# Patient Record
Sex: Male | Born: 1935 | Race: Black or African American | Hispanic: No | Marital: Married | Smoking: Former smoker
Health system: Southern US, Community
[De-identification: ages and names within clinical notes are randomized; demographics above are authoritative.]

## PROBLEM LIST (undated history)

## (undated) DIAGNOSIS — I25810 Atherosclerosis of coronary artery bypass graft(s) without angina pectoris: Secondary | ICD-10-CM

## (undated) DIAGNOSIS — Z9889 Other specified postprocedural states: Secondary | ICD-10-CM

## (undated) DIAGNOSIS — I10 Essential (primary) hypertension: Secondary | ICD-10-CM

## (undated) DIAGNOSIS — E669 Obesity, unspecified: Secondary | ICD-10-CM

## (undated) DIAGNOSIS — R55 Syncope and collapse: Secondary | ICD-10-CM

## (undated) DIAGNOSIS — E7800 Pure hypercholesterolemia, unspecified: Secondary | ICD-10-CM

## (undated) DIAGNOSIS — F0390 Unspecified dementia without behavioral disturbance: Secondary | ICD-10-CM

## (undated) DIAGNOSIS — R0602 Shortness of breath: Secondary | ICD-10-CM

## (undated) DIAGNOSIS — Z951 Presence of aortocoronary bypass graft: Secondary | ICD-10-CM

## (undated) DIAGNOSIS — M549 Dorsalgia, unspecified: Secondary | ICD-10-CM

## (undated) DIAGNOSIS — I639 Cerebral infarction, unspecified: Secondary | ICD-10-CM

## (undated) DIAGNOSIS — G8929 Other chronic pain: Secondary | ICD-10-CM

## (undated) DIAGNOSIS — D649 Anemia, unspecified: Secondary | ICD-10-CM

## (undated) DIAGNOSIS — I252 Old myocardial infarction: Secondary | ICD-10-CM

## (undated) DIAGNOSIS — Z9861 Coronary angioplasty status: Principal | ICD-10-CM

## (undated) DIAGNOSIS — I2510 Atherosclerotic heart disease of native coronary artery without angina pectoris: Principal | ICD-10-CM

## (undated) DIAGNOSIS — K219 Gastro-esophageal reflux disease without esophagitis: Secondary | ICD-10-CM

## (undated) HISTORY — DX: Atherosclerotic heart disease of native coronary artery without angina pectoris: I25.10

## (undated) HISTORY — DX: Old myocardial infarction: I25.2

## (undated) HISTORY — DX: Presence of aortocoronary bypass graft: Z95.1

## (undated) HISTORY — DX: Obesity, unspecified: E66.9

## (undated) HISTORY — DX: Coronary angioplasty status: Z98.61

## (undated) HISTORY — DX: Atherosclerosis of coronary artery bypass graft(s) without angina pectoris: I25.810

## (undated) HISTORY — DX: Syncope and collapse: R55

## (undated) HISTORY — DX: Other specified postprocedural states: Z98.890

---

## 1985-03-08 DIAGNOSIS — I252 Old myocardial infarction: Secondary | ICD-10-CM

## 1985-03-08 DIAGNOSIS — I2510 Atherosclerotic heart disease of native coronary artery without angina pectoris: Secondary | ICD-10-CM

## 1985-03-08 DIAGNOSIS — Z951 Presence of aortocoronary bypass graft: Secondary | ICD-10-CM

## 1985-03-08 HISTORY — PX: CORONARY ARTERY BYPASS GRAFT: SHX141

## 1985-03-08 HISTORY — DX: Presence of aortocoronary bypass graft: Z95.1

## 1985-03-08 HISTORY — DX: Atherosclerotic heart disease of native coronary artery without angina pectoris: I25.10

## 1985-03-08 HISTORY — DX: Old myocardial infarction: I25.2

## 1997-06-06 ENCOUNTER — Encounter (HOSPITAL_COMMUNITY): Admission: RE | Admit: 1997-06-06 | Discharge: 1997-09-04 | Payer: Self-pay | Admitting: Cardiology

## 1997-09-05 ENCOUNTER — Encounter (HOSPITAL_COMMUNITY): Admission: RE | Admit: 1997-09-05 | Discharge: 1997-12-04 | Payer: Self-pay | Admitting: Cardiology

## 1997-09-23 ENCOUNTER — Other Ambulatory Visit: Admission: RE | Admit: 1997-09-23 | Discharge: 1997-09-23 | Payer: Self-pay | Admitting: Internal Medicine

## 1997-12-06 ENCOUNTER — Encounter (HOSPITAL_COMMUNITY): Admission: RE | Admit: 1997-12-06 | Discharge: 1998-03-06 | Payer: Self-pay | Admitting: Cardiology

## 1998-03-07 ENCOUNTER — Encounter (HOSPITAL_COMMUNITY): Admission: RE | Admit: 1998-03-07 | Discharge: 1998-06-05 | Payer: Self-pay | Admitting: Cardiology

## 1998-06-06 ENCOUNTER — Encounter (HOSPITAL_COMMUNITY): Admission: RE | Admit: 1998-06-06 | Discharge: 1998-09-04 | Payer: Self-pay | Admitting: Cardiology

## 1998-08-21 ENCOUNTER — Ambulatory Visit (HOSPITAL_COMMUNITY): Admission: RE | Admit: 1998-08-21 | Discharge: 1998-08-21 | Payer: Self-pay | Admitting: Internal Medicine

## 1998-08-22 ENCOUNTER — Emergency Department (HOSPITAL_COMMUNITY): Admission: EM | Admit: 1998-08-22 | Discharge: 1998-08-22 | Payer: Self-pay | Admitting: Emergency Medicine

## 1998-09-05 ENCOUNTER — Encounter (HOSPITAL_COMMUNITY): Admission: RE | Admit: 1998-09-05 | Discharge: 1998-10-06 | Payer: Self-pay | Admitting: Cardiology

## 1998-10-07 ENCOUNTER — Encounter (HOSPITAL_COMMUNITY): Admission: RE | Admit: 1998-10-07 | Discharge: 1999-01-05 | Payer: Self-pay | Admitting: Cardiology

## 2002-03-08 DIAGNOSIS — I2510 Atherosclerotic heart disease of native coronary artery without angina pectoris: Secondary | ICD-10-CM

## 2002-03-08 DIAGNOSIS — Z9861 Coronary angioplasty status: Secondary | ICD-10-CM

## 2002-03-08 DIAGNOSIS — I25810 Atherosclerosis of coronary artery bypass graft(s) without angina pectoris: Secondary | ICD-10-CM

## 2002-03-08 HISTORY — PX: CORONARY STENT PLACEMENT: SHX1402

## 2002-03-08 HISTORY — DX: Atherosclerotic heart disease of native coronary artery without angina pectoris: I25.10

## 2002-03-08 HISTORY — DX: Atherosclerotic heart disease of native coronary artery without angina pectoris: Z98.61

## 2002-03-08 HISTORY — DX: Atherosclerosis of coronary artery bypass graft(s) without angina pectoris: I25.810

## 2002-03-23 ENCOUNTER — Encounter: Payer: Self-pay | Admitting: Cardiology

## 2002-03-23 ENCOUNTER — Inpatient Hospital Stay (HOSPITAL_COMMUNITY): Admission: AD | Admit: 2002-03-23 | Discharge: 2002-03-28 | Payer: Self-pay | Admitting: Cardiology

## 2002-03-27 ENCOUNTER — Encounter: Payer: Self-pay | Admitting: Cardiovascular Disease

## 2002-03-27 ENCOUNTER — Encounter (INDEPENDENT_AMBULATORY_CARE_PROVIDER_SITE_OTHER): Payer: Self-pay | Admitting: Cardiology

## 2004-03-08 DIAGNOSIS — Z9889 Other specified postprocedural states: Secondary | ICD-10-CM

## 2004-03-08 HISTORY — DX: Other specified postprocedural states: Z98.890

## 2005-01-06 HISTORY — PX: CORONARY ANGIOPLASTY: SHX604

## 2005-01-29 ENCOUNTER — Inpatient Hospital Stay (HOSPITAL_COMMUNITY): Admission: RE | Admit: 2005-01-29 | Discharge: 2005-01-30 | Payer: Self-pay | Admitting: Cardiology

## 2005-03-09 ENCOUNTER — Ambulatory Visit (HOSPITAL_COMMUNITY): Admission: RE | Admit: 2005-03-09 | Discharge: 2005-03-09 | Payer: Self-pay | Admitting: Internal Medicine

## 2005-03-10 ENCOUNTER — Ambulatory Visit (HOSPITAL_COMMUNITY): Admission: RE | Admit: 2005-03-10 | Discharge: 2005-03-10 | Payer: Self-pay | Admitting: Internal Medicine

## 2005-07-07 ENCOUNTER — Encounter: Payer: Self-pay | Admitting: Internal Medicine

## 2006-01-06 IMAGING — CR DG UGI W/ HIGH DENSITY W/KUB
1 series · 1 of 1 positions shown · non-contrast
Comparison: none

CLINICAL DATA: Dysphagia.  Abdominal discomfort. 
DOUBLE CONTRAST UPPER G.I. WITH KUB:

[view not recorded]
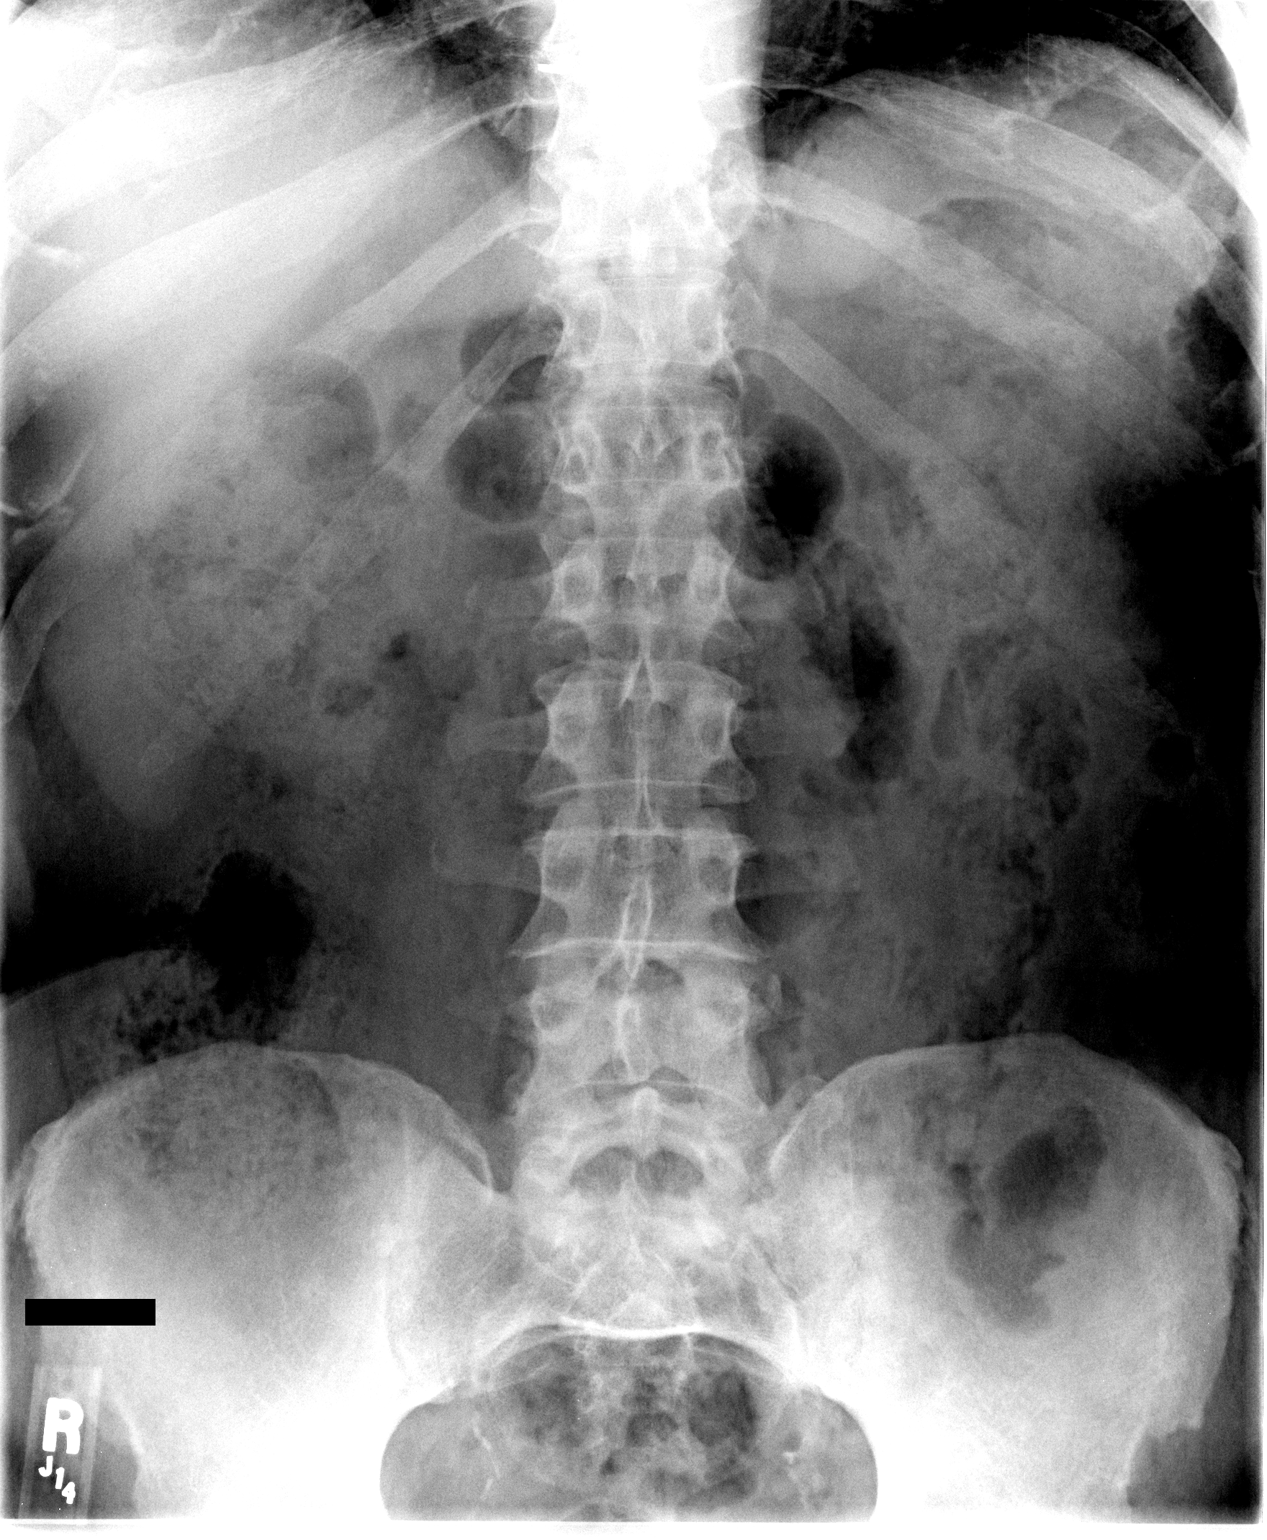

[1 of 1 positions shown; findings below may reference images not displayed]

FINDINGS: A scout film of the abdomen demonstrates unremarkable bowel gas pattern.  
Pharynx is unremarkable.  Normal esophageal peristalsis is identified.  There is a small focal filling defect at the GE junction on one series which may represent a ?pseudolesion? but recommend follow up or further evaluation to exclude small mass.  A small hiatal hernia with gastroesophageal reflux is noted.  The stomach is unremarkable.  The duodenal bulb is normal.  A distal duodenal diverticulum is present.
IMPRESSION: 1.  Small filling defect/irregularity at the GE junction - may represents a ?pseudolesion? but followup or endoscopy is recommended. 
2.  Small hiatal hernia and gastroesophageal reflux.

## 2006-03-14 ENCOUNTER — Emergency Department (HOSPITAL_COMMUNITY): Admission: EM | Admit: 2006-03-14 | Discharge: 2006-03-15 | Payer: Self-pay

## 2007-03-12 ENCOUNTER — Emergency Department (HOSPITAL_COMMUNITY): Admission: EM | Admit: 2007-03-12 | Discharge: 2007-03-12 | Payer: Self-pay | Admitting: Emergency Medicine

## 2008-02-27 ENCOUNTER — Inpatient Hospital Stay (HOSPITAL_COMMUNITY): Admission: AD | Admit: 2008-02-27 | Discharge: 2008-03-02 | Payer: Self-pay | Admitting: Internal Medicine

## 2008-08-06 HISTORY — PX: NM MYOVIEW LTD: HXRAD82

## 2008-08-06 HISTORY — PX: TRANSTHORACIC ECHOCARDIOGRAM: SHX275

## 2008-09-05 ENCOUNTER — Encounter: Admission: RE | Admit: 2008-09-05 | Discharge: 2008-09-05 | Payer: Self-pay | Admitting: Neurosurgery

## 2010-07-21 NOTE — Op Note (Signed)
NAME:  Albert Nelson, Albert Nelson NO.:  [id number]   MEDICAL RECORD NO.:  [id number]          PATIENT TYPE:  INP   LOCATION:  3715                         FACILITY:  [facility]   PHYSICIAN:  Bernette Redbird, M.D.   DATE OF BIRTH:  May 25, 1923   DATE OF PROCEDURE:  02/29/2008  DATE OF DISCHARGE:                               OPERATIVE REPORT   SURGEON:  Bernette Redbird, MD   PROCEDURE:  Colonoscopy.   INDICATION:  This is an 77 year old with hematochezia and unrevealing  upper endoscopy.   FINDINGS:  Sigmoid diverticulosis.  No bleeding.   PROCEDURE:  The nature, purpose, and risks of the procedure had been  discussed with the patient, provided written consent, who underwent a  mag citrate and MiraLax prep and was brought from his hospital room to  the Endoscopy Unit.  This procedure was performed immediately following  his upper endoscopy.  Total sedation for the 2 procedures was fentanyl  75 mcg and Versed 6 mg IV without clinical instability.   Digital exam of the prostate was unremarkable.   The Pentax adult video colonoscope was advanced very easily around the  colon to the area just above the cecum.  With the help of some external  abdominal compression, I was able to enter of the base of the cecum as  identified by the absence of further lumen beyond the ileocecal valve.  The appendiceal orifice itself was never specifically identified.  Pullback was then performed.  The quality of prep was very good and it  was felt that all areas were well seen.   There was no blood whatsoever in the colonic lumen.  There was amber-  colored clear fecal effluent coming out the ileocecal valve and  throughout the colon.   In the sigmoid region, the patient had multiple diverticula.  I did not  see any blood inspissated within these.   The remainder of the exam was normal.  I did not see any vascular  ectasia, colonic ulcerations, polyps, or masses.  Retroflexion in the  rectum and  re-inspection of the rectum were normal.  No biopsies were  obtained.  The patient tolerated the procedure well, and there were no  apparent complications.   IMPRESSION:  1. No active bleeding at the time of this exam.  2. Sigmoid diverticula without alternative source of bleeding      identified.  3. Almost certainly, this is now quiescent-diverticular bleed.   PLAN:  Advance diet.  Discharge in the next day or so.           ______________________________  Bernette Redbird, M.D.     RB/MEDQ  D:  02/29/2008  T:  02/29/2008  Job:  [id number]   cc:   Margaretmary Bayley, M.D.

## 2010-07-21 NOTE — Op Note (Signed)
NAME:  LYNK, MARTI NO.:  [id number]   MEDICAL RECORD NO.:  [id number]          PATIENT TYPE:  INP   LOCATION:  3715                         FACILITY:  [facility]   PHYSICIAN:  Bernette Redbird, M.D.   DATE OF BIRTH:  01/02/1924   DATE OF PROCEDURE:  02/29/2008  DATE OF DISCHARGE:                               OPERATIVE REPORT   PROCEDURE:  Upper endoscopy.   INDICATIONS:  An 77 year old gentleman with hematochezia, with history  of aspirin and Mobic exposure, also history of significant dysphagia  symptoms.   FINDINGS:  Distal erosive esophagitis.  Prominent Schatzki's ring above  small hiatal hernia.  No NSAID gastropathy or source of bleeding  endoscopically evident.   PROCEDURE IN DETAIL:  The nature, purpose, and risks of the procedure  have been discussed with the patient who provided written consent.  He  was brought in a fasting state from his hospital room to the endoscopy  unit and sedated with fentanyl 50 mcg and Versed 3.5 mg IV without  arrhythmias or desaturation.   I had a little bit of trouble passing the Pentax adult video endoscope  through the cricopharyngeal region, but it did pop into the esophagus.  The proximal esophagus was normal but the distal esophagus had several  linear and stellate erosions consistent with reflux esophagitis or  possibly pill-induced esophagitis.  There was a prominent, slightly  irregular Schatzki's ring at the squamocolumnar junction which offered  no resistance to passage of the endoscope.  Below this was a 2-cm hiatal  hernia.   The stomach contained no significant residual, no blood or coffee-ground  material, and mucosa was remarkably normal, without evidence of any  NSAID gastropathy, erosions, gastritis, ulcers, nor any polyps, masses,  or vascular ectasia including a retroflexed view of the cardia which  looked normal.   The pylorus, duodenal bulb, and second duodenum similarly looked normal.   The  scope was removed from the patient.  I elected not to proceed to  endoscopic dilatation at this time, in part because of the erosive  changes, and in part because of a previous plan not to dilate in view of  the recent bleeding, which I did not want to take the chance of  confusing the picture.   The patient tolerated the procedure well and there were no apparent  complications.   IMPRESSION:  1. No active bleeding or likely gastrointestinal bleeding source based      on current examination, seen in the upper tract.  2. Prominent esophageal mucosal ring which probably accounts for his      dysphagia symptoms.  3. Distal erosive esophagitis.   PLAN:  1. PPI therapy for the next several weeks to help the esophagus heal.  2. Elective dilatation could be arranged in the future if the patient      continues to have significant dysphagia symptoms, although      sometimes PPI therapy alone will be enough to take care of those      symptoms.  3. Proceed to colonoscopic evaluation for evaluation of his recent  hematochezia.           ______________________________  Bernette Redbird, M.D.     RB/MEDQ  D:  02/29/2008  T:  02/29/2008  Job:  [id number]   cc:   Margaretmary Bayley, M.D.

## 2010-07-21 NOTE — Consult Note (Signed)
NAME:  Albert Nelson NO.:  [id number]   MEDICAL RECORD NO.:  [id number]           PATIENT TYPE:   LOCATION:                                 FACILITY:   PHYSICIAN:  Bernette Redbird, M.D.   DATE OF BIRTH:  [birth year]   DATE OF CONSULTATION:  DATE OF DISCHARGE:                                 CONSULTATION   We were called by Dr. Margaretmary Bayley to see this patient for rectal  bleeding.   HISTORY OF PRESENT ILLNESS:  This is an 77 year old gentleman with a  history of coronary artery disease, CVA, and colon cancer who reports  sudden onset of left lower quadrant abdominal pain yesterday afternoon,  coincident with onset of maroon bloody stools.  The patient reports 8-10  bowel movements overnight.  Also, the patient reports dysphagia to the  solids, not to liquids for the past 6 months.  He often spits out meats  and solid food.  He has to limit himself to a soft diet, mostly mashed  potatoes.  He is on Mobic, as well as Advil.  He is unsure of why.  He  has occasional constipation and no history of bleeding.   Past medical history is significant for:  1. Chronic GERD with food intake.  2. Diabetes mellitus.  3. Vertigo.  4. Coronary artery bypass grafting in 1984 and 2001.  His cardiologist      is Dr. Elsie Lincoln.  5. He has a history of CVA.  6. Hypertension.  7. Paroxysmal atrial fibrillation.  8. History of colon cancer status post hemicolectomy.  9. BPH.   Current medications are per chart but include Mobic, Plavix, and an 81  mg aspirin.   He has an allergy to PENICILLIN.   Review of systems is significant for his being weak and tired.   Social history is negative for alcohol, tobacco, and drugs.  He is  married.   Family history is negative for colon cancers and known ulcer disease.   PHYSICAL EXAMINATION:  GENERAL:  He is alert and oriented, appears to  have some periorbital edema that may be chronic.  VITAL SIGNS:  Temperature is 98.8, pulse 99,  respirations 20, blood  pressure is 107/66.  HEART:  Irregular rate and rhythm.  LUNGS:  Clear.  ABDOMEN:  Soft, nontender, nondistended with good bowel sounds.   Labs are pending including a CBC, PT/PTT, type and cross, and BMET.  CT  of his abdomen and pelvis is pending.   ASSESSMENT:  Dr. Molly Maduro Buccini has seen and examined this patient,  collected a history and reviewed his chart.  His impression is that this  is an 77 year old male on nonsteroidal antiinflammatory drugs with a  gastrointestinal bleed and left lower quadrant acute abdominal pain.  Finally, he has chronic gastroesophageal reflux disease and dysphagia to  solids.  Differential diagnoses is wide including upper tract  etiologies, such as, peptic ulcer disease or gastritis, as well as  possible angioectasias versus ischemic bowel versus even a recurrence of  his colon cancer.  We will plan to move forward with  gentle intravenous  hydration as well as intravenous pain medication, transfuse p.r.n.,  obtain a CT of his abdomen and pelvis which may help [place] to determine his  next step.  We will consider endocolon on this admission, probably a  barium swallow to evaluate his dysphagia.  Next step is to be determined  once we have labs and CT results.  Thanks very much for this  consultation.      Stephani Police, PA    ______________________________  Bernette Redbird, M.D.    MLY/MEDQ  D:  02/27/2008  T:  02/28/2008  Job:  [id number]   cc:   Margaretmary Bayley, M.D.

## 2010-07-24 NOTE — Consult Note (Signed)
NAME:  Albert, Nelson NO.:  [id number]   MEDICAL RECORD NO.:  [id number]                   PATIENT TYPE:  INP   LOCATION:  6523                                 FACILITY:  [facility]   PHYSICIAN:  Deanna Artis. Sharene Skeans, M.D.           DATE OF BIRTH:  [birth year]   DATE OF CONSULTATION:  03/26/2002  DATE OF DISCHARGE:                                   CONSULTATION   NEUROLOGY CONSULTATION:   CHIEF COMPLAINT:  Diplopia.   HISTORY OF THE PRESENT CONDITION:  The patient is a 77 year old right-handed  Afro-American gentleman who was admitted following an abnormal Cardiolite  test that showed anterolateral inferior ischemia considered to be at high  risk.   The patient was hospitalized and had stenting procedures in the left  circumflex and also the right PLA on separate days, the latter done today by  Dr. Daphene Jaeger.   Several hours after the procedure, the patient noted onset of diplopia.  He  was evaluated by Dr. Alanda Amass who found evidence of decreased ability to  gaze to the left with his right eye.  I was asked to see him to evaluate the  etiology of his dysfunction and make recommendations for further workup and  treatment.   PAST MEDICAL HISTORY:  The patient has severe atherosclerotic cardiovascular  disease and had coronary artery bypass graft in 1987 (three vessels).  The  patient's ejection fraction was about 46%.  He has dyslipidemia, noninsulin-  dependent type 2 diabetes mellitus and hypertension as risk factors for  stroke.   PAST SURGICAL HISTORY:  CABG.   REVIEW OF SYSTEMS:  Erectile dysfunction, occasional chest pain, increased  shortness of breath with exertion.  The patient has not had any bleeding  dyscrasias, any fever, change in appetite, weight loss, nausea, vomiting,  diarrhea, dysuria, hematuria.  He has arthritis in his hands and his feet.  He wears glasses.  All system review is otherwise negative.   CURRENT MEDICATIONS:  1. Zyrtec one per day.  2. Diltiazem XR 120 mg per day.  3. Glucovance 2.5/500 one twice a day.  4. Niaspan ER 1000 mg at bedtime.  5. Cozaar 50 mg per day.  6. Pravachol 80 mg at bedtime.  7. Aspirin 81 mg per day.   ALLERGIES:  PENICILLIN.   FAMILY HISTORY:  No known history of stroke.  There are others with  atherosclerotic cardiovascular disease and hypertension.   SOCIAL HISTORY:  The patient is married.  He has three children, six  grandchildren.  He has not used tobacco for 27 years.  He does not drink  alcohol.  He exercises three times a week for 60 minutes at a time.   PHYSICAL EXAMINATION:  GENERAL:  He is a pleasant well-developed gentleman  in no acute distress.  VITAL SIGNS:  Blood pressure 118/60, resting pulse 64, respirations 20,  temperature 98.0, pulse oximetry 97% on room  air.  HEAD/EYES/EARS/NOSE AND THROAT:  No signs of infection.  Supple neck.  Full  range of motion.  No cranial or cervical bruits.  LUNGS:  Clear to auscultation.  HEART:  No murmurs.  Pulses normal.  ABDOMEN:  Soft, nontender, bowel sounds normal.  EXTREMITIES:  The patient has decreased pulses in his lower extremities in  the dorsalis pedis.  His groin is clean and dry.  I did not manipulate his  right leg.  He has good pulses in his upper extremities.  Capillary refill  is noted in the lower extremities and lower extremities.  NEUROLOGIC:  Mental status - The patient is awake, alert, attentive,  appropriate.  No dysphasia or dyspraxia.  Cranial nerves - Pupils are  sluggish and poorly reactive but the left reacts better than the right.  He  has bilateral cataracts.  I cannot see his fundus more than a brief glimpse.  Symmetrical facial strength.  He is able to count fingers in all visual  fields to single and double simultaneous stimuli.  Midline tongue and uvula.  He moves all four extremities.  I did not test the right lower extremity.  Arms bilaterally and left leg are normal both for  strength and fine motor  skills.  He is able to wiggle his toes and has excellent strength in his  foot dorsiflexors and plantar flexors on the right.  This is not tested  further.  Sensory examination shows a mild peripheral stocking neuropathy  and good stereognosis.  CEREBELLAR:  Good finger-to-nose, rapid repetitive movements, gait cannot be  tested.  Deep tendon reflexes are absent.  He has bilateral flexor plantar  responses.   IMPRESSION:  1. 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  2. Diplopia.  3. Severe atherosclerotic cardiovascular disease.   PLAN:  1. CT scan of the brain in the early morning hours 03/27/02 once he can     travel.  2. Patch his eye which will stop the double vision.  Slowly I believe that     the right eye will become more mobile.  3. Check hemoglobin A1C to look at degree of glycemic control.  The patient     has other appropriate interventions for hypertension and dyslipidemia but     if it has not been checked in a while the lipid panel should be checked.   I appreciate the opportunity to see the patient.  If you have questions or I  can be of assistance, do not hesitate to contact me.                                               Deanna Artis. Sharene Skeans, M.D.   [facility]  D:  03/26/2002  T:  03/27/2002  Job:  [id number]   cc:   Gerlene Burdock A. Alanda Amass, M.D.  [phone] [address], [address]  [place]  [place] [address]  Fax: [phone]

## 2010-07-24 NOTE — Cardiovascular Report (Signed)
NAME:  Albert Nelson, Albert Nelson NO.:  [id number]   MEDICAL RECORD NO.:  [id number]                   PATIENT TYPE:  INP   LOCATION:  4732                                 FACILITY:  [facility]   PHYSICIAN:  Nicki Guadalajara, M.D.                  DATE OF BIRTH:  Nov 04, 1923   DATE OF PROCEDURE:  03/26/2002  DATE OF DISCHARGE:                              CARDIAC CATHETERIZATION   PROCEDURE:  Coronary intervention.   INDICATIONS:  The patient is a 77 year old [place] American male, patient of  Drs. Elsie Lincoln and [employer].  He is status post prior bypass surgery in 1987 by  Dr. Andrey Campanile with a LIMA to the LAD vein to diagonal vein to the PDA.  He  recently was admitted with chest pain, increasing shortness of breath  following an abnormal Cardiolite scan.  Catheterization was done by Dr.  Elsie Lincoln on March 23, 2002, which showed total native LAD occlusion after  the diagonal vessel with a patent LIMA to the mid distal LAD, but with  diffuse 95% stenoses in the small distal LAD system.  He had an occluded  vein graft to the diagonal and an occluded vein graft to the PDA.  A  circumflex vessel had proximal 75% stenoses and then the marginal had  diffuse 90% segmental stenoses.  The right coronary artery had 50-70%  proximal narrowing and then had 95% stenosis in the region of the PLA  vessel.  Dr. Elsie Lincoln performed initial stage intervention of the circumflex  marginal vessel with stenting proximally with a 2.5 x 13 mm CYPHER stent of  greater than 2.75 post-dilatation, and then with tandem stenting of the mid  and distal circumflex.  The patient is now referred for stage intervention  of the right coronary artery.   DESCRIPTION OF PROCEDURE:  After premedication with Valium 5 mg  intravenously, the patient was prepped and draped in the usual fashion.  The  right femoral artery was punctured anteriorly and a 7 French sheath was  inserted.  Heparin 4100 units was administered and  the patient received  double bolus Integrilin.  The patient also was given additional Plavix since  it did not appear that he had been receiving this for some reason.  A right  7 [place] guide with side holes was used for the guiding catheter.  After  documentation of therapeutic anticoagulation, a ________ guide wire, soft  was used and advanced down to the distal right coronary artery.  Due to the  segmental nature of the stenoses proximal and distal to the PDA, pre-  dilatation was necessary.  A 2.5 x 15 mm Maverick balloon was advanced and  dilatation was done beyond the PDA takeoff in the distal right coronary  artery at the site of the tightest lesion. It did appear that there was  stenoses up to 70% proximal of the PDA, and for this reason,  this was also  dilated. It was felt that the entire segment would need to be stented in  light of the diffuseness of the disease proximal and distal to the PDA.  Attempts were made to then insert a 2.0 x 23 mm CYPHER stent, but this  seemed to hangup at this site of initial tightest lesion despite the  previous pre-dilatation.  The stent was therefore removed.  Additional IC  nitroglycerin was administered.  Additional pre-dilatation was performed.  Again the stent was advanced to the this distal right coronary artery, but  again was unable to cross this site beyond the PDA.  At this point, the  stent was again removed and it was felt that we needed to exchange the wire  for a more stiff [employer] grand slam wire. After this wire was inserted  dilatation was again done with the Maverick balloon.  A 3.0 x 23 mm CYPHER  stent was then able to be deployed at the site covering both proximal and  distal to the PDA takeoff.  Dilatation was done up to a maximum of 16  atmospheres.  There was TIMI-3 flow.  There was no evidence for any  dissection. There was no change in the more distal stenosis of approximately  40% in the PLA and there was no change in the  proximal 50 to less than 60%  proximal RCA stenosis.  At this time, however, the patient had received  under 400 cubic centimeter of contrast, and it was felt that the additional  sites did not need to be dilated.  Discussion regarding LIMA to distal LAD  intervention will be done with Dr. Elsie Lincoln, but in light of the current dye  load, this will not be done presently.   HEMODYNAMIC DATA:  Central aortic pressure was 110/60, mean 80.   ANGIOGRAPHIC DATA:  The right coronary artery had smooth area of 50 to less  than 60% narrowing beyond the proximal bend.  After the acute margin, the  right coronary artery narrowed to 60 to 70% proximal to the PDA, and then  had irregularity up to 95% beyond the PDA takeoff in the distal right  coronary artery. There also was narrowing of approximately 40% more distally  in the area of the PLA takeoff.  Following PTCA and ultimately stenting, to  diffuse 70% to 95% stenoses before and after the PDA takeoff were ultimately  reduced to 0% with the culmination in a 3.0 x 23 mm drug-eluting CYPHER  stent dilated up to 3.3 mm.  There was no change in the distal 40% PLA  stenosis and then the 50 to less than 60% proximal RCA stenosis.   IMPRESSION:  Difficult, but successful percutaneous transluminal coronary  angioplasty stenting of the distal right coronary artery proximal to and  just distal to the posterior descending artery takeoff with the 70 to 95%  diffuse stenosis being reduced to 0% (3.0 x 23 mm drug-eluting CYPHER stent)  done with double bolus Integrilin and weight-adjusted heparinization.                                               Nicki Guadalajara, M.D.    TK/MEDQ  D:  03/26/2002  T:  03/26/2002  Job:  [id number]   cc:   Madaline Savage, M.D.  [address]  [place]  [place] [address]  Fax: [phone]   Margaretmary Bayley, M.D.  [address]  [place]  [place] [address]  Fax: [phone]   Janeece Riggers

## 2010-07-24 NOTE — Discharge Summary (Signed)
NAME:  Albert Nelson, Albert Nelson NO.:  [id number]   MEDICAL RECORD NO.:  [id number]                   PATIENT TYPE:  INP   LOCATION:  6523                                 FACILITY:  [facility]   PHYSICIAN:  Learta Codding, M.D. [facility]             DATE OF BIRTH:  06-04-1923   DATE OF ADMISSION:  03/23/2002  DATE OF DISCHARGE:  03/28/2002                           DISCHARGE SUMMARY - REFERRING   HISTORY:  The patient is a pleasant 77 year old male with a history of  coronary artery disease who was admitted to [facility] in December  2003, with an exacerbation of his congestive heart failure.  He was seen by  Dr. Russella Dar in the office on March 27, 2002, to follow up his anemia.  The  patient complained of significant weakness, dizziness, and syncope in the  office.  The patient was found to have a blood pressure of 88/60.  The  patient had been feeling extremely lethargic for at least two weeks.  He  also has had some problems with vertigo which had been worked up by Dr. Sandria Manly  and apparently a MRI was recently performed.  The symptoms on the day he saw  Dr. Russella Dar were different from his vertigo symptoms.  He was referred to Dr.  Andee Lineman for further evaluation.  Dr. Andee Lineman felt the patient should be  admitted to the hospital for further evaluation of hypotension.  He felt the  patient may have been dehydrated.   PAST MEDICAL HISTORY:  The patient has a history of coronary artery bypass  graft surgery x2 in 1984, with a LIMA to the LAD, saphenous vein graft to  the OM-2, saphenous vein graft to the OM-1, saphenous vein graft to the  diagonal, saphenous vein graft to the PDA.  In 2001, the patient had a LIMA  to the LAD, saphenous vein graft to the OM-1 and OM-2.  The patient is  status post cardiac catheterization in July 2003.  The LAD was found to be  totally occluded.  The first diagonal had a 90% lesion, the septal  perforator had a 90% lesion, circumflex had a  90%, the RCA was totally  occluded.  The LIMA to the LAD was patent.  The RIMA to the LAD was also  patent.  There was a distal LAD beyond the anastomosis which was diseased at  approximately 70%.  There was a saphenous vein graft to the OM-1 and OM-2  which were patent.  The PDA was totally occluded.  The posterior lateral  branch was 90%.  There were left and right collaterals and the autografts  were noted to be nonfunctional.   The patient also has a history of paroxysmal atrial fibrillation, history of  postural dizziness with long and short-term memory problems.  He is an  insulin-dependent diabetic.  He has a history of hypertension,  hyperlipidemia.  There is a positive  family history of coronary artery  disease, history of obesity.  His ejection fraction is 55% at the time of  his last catheterization in September 2003.  There is a history of colon  cancer; he is status post hemicolectomy and left knee surgery as well as  bilateral hernia repairs.   ALLERGIES:  No known drug allergies.   SOCIAL HISTORY:  The patient lives in [place] with his wife.  He is  retired.  He does not smoke or use alcohol.   FAMILY HISTORY:  Positive for coronary artery disease.   HOSPITAL COURSE:  As noted, the patient was admitted to [facility]  on March 28, 2002, after being seen in the office by Dr. Andee Lineman with  hypotension.  The patient was felt to be dehydrated and it was felt that he  was over diuresed with his diuretics.  His ACE inhibitor, his Beta-Blocker  and his diuretics were held at the time admission.  The BUN was noted to be  significantly elevated at 80 with creatinine 2.0.  A BNP was 80.  A CBC  revealed hemoglobin 12.9.   The patient was hydrated.  He improved significantly.  On the March 29, 2002, his BUN was down to 43, creatinine was 1.1.  It was felt the patient  could be discharged with early followup in the office.  The patient's Toprol  was resumed prior  to discharge.  His Lasix and Lotensin were to be held.   LABORATORY DATA:  A CBC on March 27, 2002, revealed hemoglobin 12.9,  hematocrit 38.7, WBC 10.1, platelets 186,000.  On the day of discharge, BUN  was 43, creatinine 1.1, potassium 4.4, glucose 126.  On admission, BUN was  80, creatinine 2.0, potassium 5, glucose 161.  Cardiac enzymes were  negative.  His BNP on admission was 166, a repeat was 80.   Chest x-ray showed cardiomegaly with previous bypass surgery with no acute  cardiopulmonary disease.   DISCHARGE MEDICATIONS:  The patient was discharged on his previous  medications which included:  1. Lipitor 20 mg at h.s.  2. Lorazepam 1 mg p.o. t.i.d.  3. Nexium 40 mg daily.  4. Multivitamin daily.  5. Insulin as directed.  6. Aspirin 81 mg daily.  7. Toprol XL 50 mg daily.  8. Plavix 75 mg daily.  9. Vitamin C 1,000 mg daily.  10.      Fluoracizine 30 mg a.m., 20 mg p.m.  11.      His Lotensin was held as was his Lasix.  He was previously on     Lotensin 20 mg daily, Lasix 40 mg b.i.d.  12.      He was told not to take ibuprofen for now as well.  13.      The patient is told to take Tylenol as needed for pain.  He was     told to avoid Aleve, ibuprofen, Motrin, Advil etc.  14.      He was given a prescription for Darvocet-N 100 one q.6 h. p.r.n. as     needed for severe hip pain.   ACTIVITY:  As tolerated but nothing strenuous.   DIET:  He is to be on a low-salt, low-fat, diabetic diet.   DISCHARGE INSTRUCTIONS:  He is to have a CBC and a basic metabolic panel on  his next office visit.   FOLLOWUP:  He is to be seen by Dian Queen, P.A.-C. on Monday, January 26,  at 4:15 p.m.  He is to see Dr. Vernon Prey as needed or as scheduled.   PROBLEM LIST:  1. Dehydration associated with hypotension.  The patient's Lotensin and     diuretics have been held.  If he is improved when he is seen in the    office on Monday, his Lasix can be resumed at a lower dose.  He was      previously on 40 mg b.i.d.  Probably 40 mg daily would be a good dose and     then his Lotensin could be resumed if his BUN and creatinine have     improved.  2. History of coronary artery disease as documented above with previous     coronary artery bypass graft and redo coronary artery bypass graft.  3. History of paroxysmal atrial fibrillation.  4. History of memory problems.  5. History of insulin-dependent diabetes mellitus.  6. Hypertension.  7. Hyperlipidemia.  8. Positive family history of coronary artery disease.  9. Obesity.  10.      Ejection fraction of 55% by catheterization in September 2003.  11.      History of colon cancer.  12.      Status post hemicolectomy, knee surgery and bilateral hernia     repairs.  13.      Renal insufficiency, improving at time of discharge.   ADDENDUM:  It should also be noted that his potassium is also on hold along  with his Lasix.     Kinnie Scales. Rinehuls, P.A.-C, [facility]            Learta Codding, M.D. [facility]    DLR/MEDQ  D:  03/29/2002  T:  03/29/2002  Job:  [id number]   cc:   Dennison Nancy  M.D.    Ernestina Penna  M.D.

## 2010-07-24 NOTE — Cardiovascular Report (Signed)
NAME:  Albert Nelson, Albert Nelson NO.:  [id number]   MEDICAL RECORD NO.:  [id number]                   PATIENT TYPE:  OIB   LOCATION:  2899                                 FACILITY:  [facility]   PHYSICIAN:  Madaline Savage, M.D.             DATE OF BIRTH:  08/02/1923   DATE OF PROCEDURE:  03/23/2002  DATE OF DISCHARGE:                              CARDIAC CATHETERIZATION   PROCEDURES PERFORMED:  1. Selective coronary angiography by Judkins technique.  2. Retrograde left heart catheterization.  3. Left ventricular angiography.  4. Selective visualization of the left internal mammary artery graft.  5. Selective visualization of multiple saphenous vein grafts.  6. Percutaneous coronary stenting of the distal left circumflex.  7. Percutaneous stenting of the middle circumflex.  8. Percutaneous stenting of the proximal circumflex.   ENTRY SITE:  Right femoral.   DYE USED:  Omnipaque.   MEDICATIONS GIVEN:  Intravenous heparin, ACT achieved was 317 seconds.  Intravenous ReoPro.  Multiple agents were given for sedation and for right  arm pain, including Demerol, Phenergan, fentanyl.   PATIENT PROFILE:  The patient is a 77 year old African American gentleman  who underwent cardiac catheterization at [facility] in 1998 and  subsequently coronary artery bypass grafting by Dr. Particia Lather in 1988  with an internal artery mammary graft to his LAD, a vein graft to his  posterior descending branch, and a vein graft to a diagonal branch.  The  patient had been followed in [place] since that time and has had multiple  negative stress test including his last negative stress test, which would  have been about two years ago.  Despite having absence of symptoms, the  patient had a Cardiolite stress test, March 22, 2002, which showed  anterolateral inferior and anterior wall ischemia that was felt to be high-  grade, that was read yesterday, and the patient was  brought in this morning  for outpatient cardiac catheterization electively.   RESULTS:  PRESSURES:  The left ventricular pressure was 100/5, end-diastolic  9, central aortic pressure 120/65, mean of 90.  These were done at different  times.  There was no pullback gradient across the aorta.   ANGIOGRAPHIC RESULTS:  The left main coronary artery appeared normal.   The left anterior descending coronary artery was 100% occluded in the  midportion just beyond the second septal perforator branch. There was a 75%  lesion in the proximal LAD.   The circumflex is un bypassed.  There is a 75% stenosis in the proximal  vessel.  In the midportion of an obtuse marginal branch, there are two  lesions of 95% and in the mid to distal portion of the obtuse marginal  branch there is a 75% lesion.   The right coronary artery is large and dominant.  There is a 60% stenosis in  the first portion of the mid right coronary artery.  The  distal vessel  tapers down and then bifurcates into PLA and PDA.  The PDA appears normal.  The PLA contains a proximal stenosis of 95%.   The saphenous vein graft to the PDA is 100% occluded at the proximal  anastomotic site.   The saphenous vein graft to the diagonal branch of the LAD is 100% occluded.  The internal mammary graft to the LAD is widely patent. However, after it  inserts into the native LAD, there are twin lesions of 95% distally and near  the apex.  The vessel is very small at that point and may not be suitable  for percutaneous intervention.   The left ventricle shows good contractility, ejection fraction estimate at  45%.  There was inferobasal hypokinesis of moderate degree compatible with  an old inferior MI.   PERCUTANEOUS INTERVENTION:  This was performed using a 7 French 3.5 Voda  guide catheter which was an excellent choice, a Asahi medium guide wire by  [employer].  The pre-dilatation balloon across the midportion of the circumflex  was a 2.5 x 20  mm Maverick balloon. It was inflated to 8 atmospheres of  pressure and reduced the dimpling caused by the 95% lesions, of which there  were three.   Post balloon dilatation, a 2.5 x 26 mm Cypher stent was placed across the  distal portion of the vessel and partially covered the midportion of the  vessel and was deployed with 14 atmospheres of pressure and then post-  dilated with a Quantum Maverick balloon to 19 atmospheres of pressure.   The lesion in the mid circumflex was then dilated with a Cypher stent 2.5 x  13 and post-dilated with a Quantum Maverick balloon 2.5 x 20 and deployed to  19 atmospheres of pressure.   Finally, the proximal circumflex required a 2.5 x 13 mm stent.  This was  post-dilated with a 2.75 Quantum Maverick balloon taken to 19 atmospheres of  pressure corresponding to an anticipate lumen diameter of 2.85.   No rhythm blood pressure or other complications occurred during the  procedure.  The patient was agitated during the procedure due to the use of  multiple agents to sedate him.   FINAL DIAGNOSES:  1. Multivessel native coronary artery disease.     a. A 100% mid left anterior descending occlusion, proximal left anterior        descending stenosis 75%.     b. Multiple lesions in the circumflex and obtuse marginal branch, all        greater than 75%, a total of five locations, see description above.     c. A 60% mid right coronary artery stenosis and 95% stenosis of proximal        posterolateral branch of the right coronary artery.     d. Left ventricular ejection fraction 45% with inferobasal hypokinesis.  2. Successful percutaneous intervention to the proximal mid and distal     circumflex coronary arteries with the following results:     a. Proximal lesion 75% reduced to 0%.     b.        A 95% stenoses of which there were three reduced to 0% residual after        stenting.    c. A 75% distal circumflex obtuse marginal stenosis of 75% reduced to 0%         residual.   The patient received ReoPro and heparin during the case.  ACT was 317.  No  bleeding complications  occurred.                                                 Madaline Savage, M.D.    WHG/MEDQ  D:  03/23/2002  T:  03/24/2002  Job:  [id number]   cc:   Margaretmary Bayley, M.D.  [address]  [place]  [place] [address]  Fax: [phone]   Cardiac Catheteriztion Laboratory

## 2010-07-24 NOTE — Discharge Summary (Signed)
NAME:  Albert Nelson, Albert Nelson NO.:  [id number]   MEDICAL RECORD NO.:  [id number]           PATIENT TYPE:   LOCATION:  3729                         FACILITY:  [facility]   PHYSICIAN:  Albert Nelson, M.D.DATE OF BIRTH:  May 05, 1923   DATE OF ADMISSION:  01/29/2005  DATE OF DISCHARGE:  01/30/2005                                 DISCHARGE SUMMARY   DISCHARGE DIAGNOSES:  1.  Angina.  2.  Abnormal Cardiolite study.  3.  Coronary artery disease with bypass grafting in 1987 x3, and a history      of right coronary artery and circumflex intervention by Dr. Tresa Endo,      January of 2004.      1.  Cutting balloon angioplasty for in-stent restenosis in the mid          circumflex reducing 80% stenosis to zero, right coronary artery          stent was patent, and he has some distal disease to the left          anterior descending beyond the graft insertion site, but it is a          small left anterior descending.  Ejection fraction was 50 to 60%,          and no mitral regurgitation.  4.  Hypertension.  5.  Hyperlipidemia.  6.  Non-insulin-dependent diabetes mellitus.  7.  Obesity.  8.  History of cerebrovascular accident after cardiac catheterization in      2004.   DISCHARGE CONDITION:  Improved.   PROCEDURES:  1.  Combined left heart cath with graft visualization, January 29, 2005.  2.  January 29, 2005, PTCA cutting balloon with in-stent mid circumflex      reducing 80% stenosis to zero by Dr. Elsie Lincoln.   DISCHARGE MEDICATIONS:  1.  Mobic 15 mg daily.  2.  Vytorin 10/40 one daily.  3.  Cozaar 50 mg daily.  4.  Diltiazem-XP 180 mg daily.  5.  Prevacid 30 mg daily.  6.  Avodart 0.5 mg daily.  7.  Glyburide/Metformin 5/500 one daily beginning on Sunday, November 26,      20 06.  8.  The two medicines not on the discharge summary, but I have called and      instructed the patient:  Plavix 75 mg daily as well as enteric-coated      aspirin 81 mg daily.  The Plavix was  called into the [facility] at      [facility] as well as a refill on his Glyburide and Glucophage for      one month until he sees his primary care physician back for those      medications.   DISCHARGE INSTRUCTIONS:  1.  ADA 1600 calorie diet.  2.  No bathing for two days.  3.  No driving for two days.  4.  Follow up with Dr. Elsie Lincoln in two weeks, and the office will call with a      date and time.   HISTORY OF PRESENT ILLNESS:  A 77 year old male, with a history of  coronary  disease including bypass grafting in 1987 with three vessels, and also since  that time in 2004 he had stents to the RCA and circumflex by Dr. Nicki Guadalajara, presented to the office after he had been seen previously for angina  with exertion relieved with rest.  Underwent Cardiolite study on December 01, 2004, revealing septal, anterior, and lateral wall infarct, and lateral  wall ischemia with an EF of 57%.  Plans were for [place] to proceed with cardiac  catheterization, which was done.   OTHER PAST MEDICAL HISTORY:  1.  History of CVA after previous cath in 2004.  2.  Treated hypertension.  3.  Hyperlipidemia.  4.  Non-insulin-dependent diabetes mellitus.  5.  Obesity.   SOCIAL HISTORY, FAMILY HISTORY, REVIEW OF SYSTEMS:  See H&P.   PHYSICAL EXAMINATION AT DISCHARGE:  VITAL SIGNS:  Blood pressure 107/67,  pulse 71, respirations 20, temp 98.2.  Oxygen saturation 99%.  HEART:  Regular rate and rhythm.  LUNGS:  Clear.  RIGHT GROIN:  Stable.   LABORATORY DATA POST PROCEDURE:  Hemoglobin 12.9, hematocrit 37.2, platelets  217, WBC 8.6, sodium 136, potassium 3.9, BUN 9, creatinine 1.0, glucose 140.   Chest x-ray revealed mild bronchitic changes.   Cardiac enzymes post procedure:  CK 56, MB 1.9.   HOSPITAL COURSE:  [name] was admitted by Dr. Elsie Lincoln after intervention  to the circumflex for in-stent restenosis, tolerated the procedure well.  The sheath was pulled without problems, and by the morning of  January 30, 2005, he was ready for discharge home.  He will follow up as an outpatient  with Dr. Elsie Lincoln.      Darcella Gasman. Annie Paras, N.P.    ______________________________  Albert Nelson, M.D.    LRI/MEDQ  D:  01/30/2005  T:  01/30/2005  Job:  [id number]   cc:   Margaretmary Bayley, M.D.  Fax: [phone]

## 2010-07-24 NOTE — Discharge Summary (Signed)
NAME:  Albert Nelson, Albert Nelson NO.:  [id number]   MEDICAL RECORD NO.:  [id number]                   PATIENT TYPE:  INP   LOCATION:  6523                                 FACILITY:  [facility]   PHYSICIAN:  Madaline Savage, M.D.             DATE OF BIRTH:  08/27/1923   DATE OF ADMISSION:  03/23/2002  DATE OF DISCHARGE:  03/28/2002                                 DISCHARGE SUMMARY   HISTORY:  The patient is a 77 year old African American male patient of Dr.  Maia Breslow and Dr. Margaretmary Bayley with diabetes and coronary artery  disease.  He had a history of  coronary artery bypass graft  in 1987 by Dr.  Andrey Campanile with a left internal mammary artery to left anterior descending  artery  and saphenous vein graft to his diagonal 1 and saphenous vein graft  to his posterior descending artery.  He apparently recently under Cardiolite  testing on 03/22/2002 with anterior lateral inferior ischemia probably  inferior scar.  This was considered a high risk study.  He came to the short  stay section for outpatient cath and possible PTCA and stenting.  This was  performed by Dr.  Chrissie Noa on 03/23/2002.  He had multiple vessel disease with  RCA 60% proximal, 95% proximal left anterior artery.  His saphenous vein  graft to his posterior descending artery was occluded totally.  His  saphenous vein graft to his diagonal was 100%.  His left internal mammary  artery to his left anterior descending artery, apparently his native left  anterior descending artery  distally was a small vessel with 95% stenosis.  His circumflex had multiple blockages 75% in the proximal portion, multiple  90 and 95% lesions mid.  This was a non bypassed vessel.  His ejection  fraction was 45%.  Dr. Elsie Lincoln went on to perform a PCI to his left  circumflex.  At the distal left circumflex, he had a Cypher Stent placed 2.5  x 26, reduced from 75 to 0, mid left circumflex had two Cypher stents, one 2  x 5 x 26 and  one 2 x 5 x 13, reduced from 95% to 0.  He had dissection in  that portion.  Proximal left circumflex had a Cypher 2.5 x 13 and a Maverick  of 2.75 x 20 reduced from 75 to 0.  He had no angina and no ST changes and  no arrhythmias.  He did have some agitation from his sedatives during the  procedure.   He had some oozing of his right groin post procedure  A FemoStop was applied  from 4:30 a.m. to 6 a.m. It was felt that he needed a staged procedure with  intervention planned for his RCA.  On 03/26/2002, he underwent PCI again by  Dr. Nicki Guadalajara.  He had a Maverick Stent placed before and after the PDA  takeoff.  Please see  Dr. Landry Dyke note for complete details.  He apparently  received a 2.5 x 15 Maverick and a 3.0 x 23 Cypher Stent to the area.  His  proximal RCA 50 to 50% was smooth and was not dilated.  The films were  reviewed among several of our physicians, and it was decided not to  intervene on the LAD.  He had some problems with diplopia post procedure.  He apparently had some problems prior to the procedure through.  Neuro  consult was called.  He was seen by Dr. Sharene Skeans.  He felt that this was not  a stroke; it was a right medial rectus palsy.  A patch was applied to his  eye.  A PT evaluation was done.  They thought he needed physical therapy for  increased endurance and for gait stability. Rolling walker was ordered.  He  was more stable with his rolling walker. He still had much problem with his  diplopia and some unsteadiness when he walked.  Dr. Darl Householder note on  03/28/2002 stated that he was still unable to adduct his right eye.  He  thought the differential diagnosis was an isolated medial rectus palsy which  would be peripheral in a neuro neuritis versus an intranuclear  ophthalmoplegia.  He thought that the only way to tell was to do an MRI;  however, because of his stents this is not able to be done for six weeks. He  felt that there was nothing else to do, and  thought within time that it  would resolve.   Carotid dopplers were performed on 03/28/2002.  He had no right internal  carotid artery stenosis bilaterally.  His vertebral artery refill was  antegrade.  His CT scan showed some moderate atrophy.  He had some  ventriculomegaly which appeared slightly out of proportion to the degree of  atrophy and mild communicating hydrocephalus was not excluded.  This was  performed on 03/27/2002.  He was seen by Dr. Tresa Endo on 03/28/2002.  The patient  did want to go home  Home Health will be arranged for  physical therapy and  a rolling walker.   LABORATORY DATA:  On 03/27/2002, his hemoglobin was 12.5, hematocrit 36.5,  white blood cells 9.9, platelet count 215,000, CK-MBs after his procedures  were negative.  Hemoglobin A1C was 7.8, sodium 139, potassium 4.l, glucose  141, BUN 15, creatinine 0.9.   DISCHARGE MEDICATIONS:  1. Diltiazem 120 mg per day.  2. Zyrtec once per day.  3. Cozaar 50 mg once per day  4. Plavix 75 mg once per day  5. Niaspan ER 1,000 mg at bedtime for at least three months, maybe     indefinitely.  6. Enteric coated aspirin 81 mg to take half hour before his Niaspan.  7. Pravachol 80 mg at bedtime  8. Glucovance 2.5/500 twice per day   ACTIVITY:  As per physical therapy recommends.  He will have home health  physical therapy.  If there are any problems with his swallowing, he will  give [place] a call.  He is to followup with Dr. Chestine Spore for his diabetes, followup  with Dr. Anne Hahn for neurology on February at 10 a.m., he should be there at  9:30.  Followup with Dr. Elsie Lincoln on February 2 at 11:45.   DISCHARGE DIAGNOSES:  1. Crescendo angina.  2. Abnormal Cardiolite, thought to be high risk with anterior lateral     inferior ischemia.  3. Coronary artery disease status post cardiac catheterization on  03/23/2002     reveal total saphenous vein graft to his diagonal, total saphenous vein    graft to his posterior descending artery. Left  internal mammary artery to     his left anterior descending artery  was patent.  However, he had distal     Stent anastomosis  95% lesion in his native left anterior descending     artery; however, this was a small vessel.  Left circumflex:  Multiple     diseases in the native which is nonbypassed vessel.  Mid was 90% lesion,     proximal 75%.  Right coronary artery 60% proximal lesion, 95% of the     proximal left anterior artery.  He had subsequently staged procedures.     On 03/23/2002 he had a Cypher Stent placed to his distal left circumflex,     two Cypher stents to his mid left circumflex and one Cypher Stent to his     proximal left circumflex.  On 03/26/2002, he had two stents placed to his     distal right coronary artery and proximal left anterior artery.  He had a     2.5 Maverick and a 3.0 x 23 Cypher Stent placed.  4. Noninsulin dependent diabetes mellitus type 2.  5. Diplopia after his cardiac catheterization with neuro consult.     Determined not to be a cerebrovascular accident.  Diagnosis is either an     isolated medial rectus palsy or versus an intranucleus ophthalmoplegia.  6. Hypertension, controlled.  7. Dyslipidemia.     Lezlie Octave, N.P.                        Madaline Savage, M.D.    BB/MEDQ  D:  03/28/2002  T:  03/29/2002  Job:  [id number]   cc:   Marlan Palau, M.D.  [address]  [address]  [place]  [place] [address]  Fax: [phone]   Margaretmary Bayley, M.D.  [address]  [place]  [place] [address]  Fax: [phone]   Deanna Artis. Sharene Skeans, M.D.  [address]  [address]  [place]  [place] [address]  Fax: [phone]

## 2010-07-24 NOTE — Cardiovascular Report (Signed)
NAME:  Albert Nelson, Albert Nelson NO.:  [id number]   MEDICAL RECORD NO.:  [id number]          PATIENT TYPE:  OIB   LOCATION:  2899                         FACILITY:  [facility]   PHYSICIAN:  Madaline Savage, M.D.DATE OF BIRTH:  05-Sep-1923   DATE OF PROCEDURE:  01/29/2005  DATE OF DISCHARGE:                              CARDIAC CATHETERIZATION   PROCEDURES PERFORMED:  1.  Selective coronary angiography by Judkins technique.  2.  Retrograde left heart catheterization.  3.  Left ventricular angiography.  4.  Successful percutaneous cutting balloon angioplasty of the mid      circumflex coronary artery.   COMPLICATIONS:  None.   ENTRY SITE:  Right femoral.   DYE USED:  Omnipaque.   MEDICATIONS GIVEN:  IV Angiomax during case and discontinued before  catheterization laboratory exit, Plavix 150 mg p.o. given in catheterization  laboratory along with 20 mg IV Pepcid.   PATIENT PROFILE:  The patient is a 77 year old diabetic African-American  gentleman who is a medical patient Dr. Margaretmary Bayley who has a history of  coronary artery disease and coronary bypass grafting performed 1987 with  bypass grafts x3.  He had an internal mammary artery graft to the mid LAD, a  saphenous vein graft to a diagonal, and saphenous vein graft to his right  coronary artery.   The patient had previous stenting of his left circumflex coronary artery by  me in January of 2004 and three or four days later a right coronary artery  intervention by Dr. Daphene Jaeger.  He had a history of CVA after his second  percutaneous intervention in January of 2004.  He is obese, has treated  hypertension, and has hyperlipidemia.   A recent Cardiolite stress test showed an LV EF of 57%.  He had septal,  anterior, and lateral wall scar and lateral wall ischemia from mid ventricle  to apex.  He has had no chest pain.  Based on these findings he was brought  in today for cardiac catheterization and we ended up  performing percutaneous  intervention without any complications.   RESULTS:   PRESSURES:  Left ventricular pressure was 136/10, end-diastolic pressure 19.  Central aortic pressure was 120/60 and mean pressure of 89.  There was no  significant aortic valve gradient by pullback pressure, however.   ANGIOGRAPHIC RESULTS:  The patient's left main coronary artery was short,  medium in diameter, and showed no stenosis.  The LAD is diffusely diseased  including a 75% stenosis proximally and a 100% occlusion just after the  first septal perforator branch which is the mid vessel.  The left circumflex  coronary artery shows a long segment of radio-opaque stent extending from  the proximal vessel down to the distal vessel just short of the origin of a  posterior lateral branch.  There was noted to be wide patency of the stent;  however, there was one point at which there was a discrete concentric 80%  stenosis that represented in-stent restenosis about two-thirds the way down  this very long overlapping 2.5 mm CYPHER stent.  There was distal disease  in  the posterolateral but it was so tortuous that no thought of percutaneous  intervention seemed worthy.   The right coronary artery shows a 50% stenosis just after the downward turn  into the vessel at the junction of the proximal and mid vessel.  There was a  patent stent in the proximal posterolateral branch of the RCA that was  widely patent.   The saphenous vein graft to RCA was occluded at the proximal anastomotic  site.   The saphenous vein graft to first diagonal branch of LAD was a 100% occluded  at the proximal anastomotic site.   Internal mammary artery graft to LAD was widely patent throughout its  course.  However, the distal LAD after the LIMA insertion site into the LAD  showed three sequential lesions of 99% in a very small vessel that was not  amenable to either percutaneous intervention or repeat bypass grafting.  We  have  treated that medically with good success.   Left ventricular angiography shows a slightly dilated LV with good  contractility of inferior wall, mild hypokinesis of inferobasal wall, and  mild anterolateral hypokinesis.  There was no mitral regurgitation and no  evidence of LV thrombus.   Percutaneous coronary intervention was performed after the patient was given  intravenous Angiomax and we secured an ACT of approximately 340 seconds.  The guide catheter used was a 6-French Cordis XB3.5 guide catheter which was  an excellent choice providing good back-up support.  The guidewire was a  Research scientist (physical sciences) by [employer] and the area was first dilated with a 2.5 x 10 mm  cutting balloon which was deployed to 8 atmospheres of pressure a slowly.  Good result was obtained after that percutaneous cutting balloon inflation.  We then finished the case by performing a 2.75 x 15 mm Quantum Maverick well  within the limits of the stent to 16 atmospheres of pressure which would  anticipate a lumen diameter of 2.75-2.8.  There was TIMI 3 flow pre  procedure and post procedure and the stenotic area of 80% in the mid  circumflex at a site of in-stent restenosis was reduced to 0% residual.  The  case was tolerated well.  There was no angina reported and the patient had  his Angiomax discontinued and was was sent from the catheterization  laboratory table to the holding area with stable vital signs.   FINAL DIAGNOSIS:  1.  In-stent restenosis mid circumflex following stent placement 2004.  2.  No recent angina, but positive Cardiolite in the lateral wall.  3.  Successful percutaneous balloon angioplasty of mid circumflex with      reduction of an 80% lesion to 0% residual.   PLAN:  Stay overnight.  Continue Plavix, aspirin, and usual medications.  Follow up in office in several weeks.           ______________________________  Madaline Savage, M.D.   WHG/MEDQ  D:  01/29/2005  T:  01/29/2005  Job:   [id number]   cc:   Margaretmary Bayley, M.D.  Fax: [phone]   Cath Lab

## 2010-07-24 NOTE — H&P (Signed)
NAME:  Albert Nelson, Albert Nelson NO.:  [id number]   MEDICAL RECORD NO.:  [id number]                   PATIENT TYPE:  INP   LOCATION:  6523                                 FACILITY:  [facility]   PHYSICIAN:  Learta Codding, M.D. [facility]             DATE OF BIRTH:  1923-11-17   DATE OF ADMISSION:  03/23/2002  DATE OF DISCHARGE:                                HISTORY & PHYSICAL   CHIEF COMPLAINT:  Dizziness and weakness as well as presyncope.   HISTORY OF PRESENT ILLNESS:  The patient is a 77 year old male with history  of known coronary artery disease admitted in December for CHF exacerbation.  The patient was seen today in the office by Dr. Russella Dar as followup for his  diagnosed anemia.  The patient complained of marked weakness, dizziness, and  presyncope.  In the office with Dr. Russella Dar he was found to have a blood  pressure of 88/60.  The patient states that over the last two weeks, he has  been feeling extremely lethargic with a complete lack of energy and  essentially zero exercise tolerance.  Of note is that he did have some  complaints of vertigo which had been worked up by Dr. Sandria Manly and a recent MRI  was done, but the patient clearly states that this is not consistent with  his vertiginous feelings and there is clearly no spinning of the room.  He  also reports symptoms of orthostasis, feeling that he is going to fall when  he stands up too quickly.  He denies, however, any substernal chest pain.  He has no shortness of breath.  His Lasix was recently cut because of the  above symptoms.  Dr. Corinda Gubler had referred the patient for anemia to Dr.  Russella Dar.  Of note is that the patient is on multiple antiplatelet agents  including ibuprofen, Plavix, and aspirin.  He has not noticed, however, any  melanotic stools or hematochezia.  The patient was seen as an add-on in the  office.  He is clearly frail in appearance.  He denies any chest pain.  He  has no shortness of breath.   He denies any palpitations.  He denies syncope.  He denies abdominal pain.   PAST MEDICAL HISTORY:  1. Status post coronary artery bypass graft x3 1984, LIMA to the LAD,     saphenous vein graft to OM-2, saphenous vein graft to OM-1, and saphenous     vein graft to diagonal, saphenous vein graft to PDA.  In 2001,  LIMA to     the LAD, saphenous vein graft to OM-1 and OM-2.  2. Status post cardiac catheterization, July 2003, with a LAD that was     totaled, first diagonal 90%, septal perforation 90%, circumflex 90%, RCA     total.  LIMA to the LAD was patent.  RIMA to the LAD was also patent.  Distal LAD beyond the anastomosis was diseased approximately 70%,     saphenous vein grafts to OM-1 and OM-2 were patent.  PDA was totaled.     Posterolateral branch was 90%, left and right collaterals and autografts     are nonfunctional.  3. Severe paroxysmal atrial fibrillation.  4. History of postural dizziness, long term and short memory problems.  5. History of insulin-dependent diabetes mellitus.  6. Hypertension.  7. Hyperlipidemia.  8. Family history of coronary artery disease.  9. Obesity.  10.      Normal left ventricular systolic function with ejection fraction of     55% by catheterization, September 2003.  11.      History of colon cancer.  12.      Status post hemicolectomy and left knee surgery as well as     bilateral hernia repairs.   MEDICATIONS:  1. Ibuprofen 800 mg, one tablet p.o. b.i.d.  2. Fluoracizine 30 mg in a.m., 20 mg in p.m.  3. Lotensin 20 mg daily.  4. Lipitor 20 mg daily.  5. Furosemide 40 mg one tablet p.o. b.i.d.  6. Lorazepam 1 mg p.o. t.i.d.  7. Nexium 40 mg p.o. daily.  8. Potassium chloride 20 mEq p.o. daily.  9. Multivitamin one tablet a day.  10.      Lantus and Humalog as directed.  11.      Aspirin 81 mg daily.  12.      Toprol XL 50 mg p.o. daily.  13.      Vitamin C 1000 mg p.o. daily.  14.      Plavix 75 mg daily.   SOCIAL HISTORY:  The  patient lives in [place] with his spouse.  He is  retired.  He does not smoke or drink alcohol.   FAMILY HISTORY:  Family history is positive for coronary artery disease.   ALLERGIES:  No known drug allergies.   REVIEW OF SYSTEMS:  As per HPI.   PHYSICAL EXAMINATION:  VITAL SIGNS: Blood pressure 100/70, heart rate 65  bpm.  GENERAL:  Well-nourished white male, obese, but in no distress.  HEENT:  Pupils isocoric.  Conjunctivae clear but pale.  LUNGS:  Clear breath sounds bilaterally.  HEART:  Irregular rate and rhythm, normal S1, S2.  Soft systolic murmur,  left upper sternal border.  PMI is nondisplaced.  ABDOMEN:  Soft, nontender.  No rebound or guarding.  There are good bowel  sounds.  EXTREMITIES:  Peripheral pulses are 2+.  There is no cyanosis, clubbing, or  edema.   LABORATORY DATA:  A 12-lead electrocardiogram demonstrates normal sinus  rhythm with sinus arrhythmia, but no acute ischemic changes.  Heart rate 69  bpm.   IMPRESSION/PLAN:  1. Hypotension.  The patient complains of marked dizziness and presyncope.     He does have a history of gait disturbance and vertigo, but his symptoms     are not consistent with this.  I suspect he may be anemic.  He is on     multiple antiplatelet agents.  I have held most of his antihypertensive     and heart failure medications and advised the patient that he needs to be     admitted to the hospital.  Stat CBC will be drawn.  If the patient is     significantly anemic he may require blood transfusion.  He also may     require inpatient gastrointestinal evaluation rather than outpatient     assessment.  The  patient will not be ruled out for myocardial infarction,     but there is no evidence of an acute coronary syndrome on his baseline or     12-lead electrocardiogram.  2. Coronary artery disease.  This appears to be stable.  The patient denies     any substernal chest pain. 3. Anemia.  Guaiac stools will be obtained.  I suspect  the patient may have     occult blood loss.  His hemoglobin in December was 10.9.  I anticipate     this will be significantly lower.  I have also held his diuretics in this     setting.  4. Diabetes mellitus.  Continue his current insulin regimen.  Fingerstick     blood sugars and sliding scales will be instituted.  5. History of paroxysmal atrial fibrillation, currently in normal sinus     rhythm.    DISPOSITION:  I anticipate the patient will be in the hospital for several  days.  Certainly evaluation for hypotension is in order with the problem  list as outlined above.                                               Learta Codding, M.D. [facility]    GED/MEDQ  D:  03/27/2002  T:  03/27/2002  Job:  [id number]   cc:   Cecil Cranker, M.D. [facility] T. Pleas Koch., M.D. [facility]  [address]  [place]  [place] [address]  Fax: [phone]   Ernestina Penna, M.D.  [address] [place]  [place] [address]  Fax: [phone]

## 2010-07-24 NOTE — Discharge Summary (Signed)
NAME:  CHAPIN, ARDUINI NO.:  [id number]   MEDICAL RECORD NO.:  [id number]          PATIENT TYPE:  INP   LOCATION:  3715                         FACILITY:  [facility]   PHYSICIAN:  Margaretmary Bayley, M.D.    DATE OF BIRTH:  [birth year]   DATE OF ADMISSION:  02/27/2008  DATE OF DISCHARGE:  03/02/2008                               DISCHARGE SUMMARY   DISCHARGE DIAGNOSES:  1. Rectal bleeding with significant colonic diverticulosis and without      evidence of diverticulitis.  2. Mild erosive esophagitis.  3. Arteriosclerotic heart disease and coronary artery disease.  4. Type 2 diabetes mellitus.  5. Chronic gastroesophageal reflux disease.  6. Status post coronary artery bypass grafting.  7. History of a cerebrovascular accident.  8. Systemic hypertension.  9. Paroxysmal atrial fibrillation.  10.Benign prostatic hypertrophy.   REASON FOR ADMISSION:  This is one of several [facility]  hospitalizations for Mr. Clayson who is an 77 year old hypertensive type  2 diabetic gentleman who was admitted with 18 hours of rectal bleeding.  The patient was brought into the office by his wife where he was noted  to be without abdominal pain but on examination did have evidence of  bloody formed of maroon culture to red stool.  According to his wife,  the patient had no associated chest pain or orthostatic dizziness.  He  was subsequently transferred and admitted to the hospital for further  evaluation.  Of note is that the patient is currently being treated with  Plavix and aspirin for his coronary artery disease and arteriosclerotic  heart disease.   PERTINENT PHYSICAL FINDINGS:  GENERAL:  He is a well-developed, slightly  obese African American gentleman appearing anxious.  SKIN:  Warm and dry.  VITAL SIGNS:  Blood pressure 108/79 with a pulse rate of 88 and sitting  with his legs dangling a blood pressure of 100/62 with a pulse rate of  96, respiratory rate of 20 unlabored, and  temperature is 97.8.  HEENT:  There is no conjunctival pallor and no scleral icterus.  Skin is  cool but dry.  NECK:  Supple.  No adenopathy or carotid bruits.  He has no splinting or  tenderness.  LUNGS:  Clear.  CHEST:  He has a well-healed scar secondary to his previous bypass  graft, no gallops or rubs were noted.  ABDOMEN:  Protuberant, obese with some mild lower abdominal tenderness.  No obvious masses and no organomegaly.  RECTAL:  He had dark maroon  colored stool, no obvious masses were noted on digital exam.  His  sphincter had good tone.  EXTREMITIES:  No clubbing, cyanosis or edema.  Negative Homans' sign.  No calf tenderness.  NEUROLOGIC:  He was alert and oriented x3.  Speech is fluent.  No focal,  sensory, motor or reflex deficits.   PERTINENT LAB AND X-RAY DATA:  His white count is 11,600, hematocrit of  31.4 with a hemoglobin of 10.3, platelet count of 222,000.  His INR is  1.1.  CMET normal with the exception of glucose of 209.  EKG normal  sinus  rhythm with a rate of 88, good deviation of the electrical axis,  nonspecific repolarization changes.  Three-way of the abdomen revealed  no acute changes.   HOSPITAL COURSE:  1. The patient is admitted with rectal bleeding with ischemic colitis      and diverticulosis being most likely etiologies and carcinoma of      the colon to be excluded.  The patient has a history of a hiatal      hernia with GERD.  2. Systemic hypertension and type 2 diabetes mellitus and blood loss      related to anemia.  The patient was admitted to a telemetry bed.      His aspirin and Plavix were discontinued.  He received 2 units of      red cells that were placed on hold and CBCs rechecked every 6 hours      for the first 24 hours along with orthostatic checks.  A GI      consultation was obtained and he was started on clear liquids in      preparation for an endoscopic evaluation of his colon and lower GI      tract.  The patient was seen  by Dr. Bernette Redbird who initiated a      bowel prep.  He was empirically started on IV PPI therapy.   The patient did undergo an abdominal CT scan prior to his endoscopy and  there were no obvious lesions noted.  The patient was transfused 2 units  of red cells on the second hospital day in view of his history of  significant coronary artery disease and the fact that he had lost at  least 2 grams of hemoglobin since his admission.  During this  hospitalization, there was no associated abdominal pain.  His diabetic  control was maintained with a standard use of a fractional schedule of  NovoLog based on his preprandial blood sugars.  On the third hospital  day, he underwent upper and lower endoscopy.  He had no evidence of  ischemic colitis but did have significant colonic diverticulosis.  Evaluation of the upper GI tract was remarkable for mild erosive  esophagitis.  It was felt that his hematochezia was most likely related  to a significant diverticulosis.  Two to three days after his endoscopy,  there was no evidence of any rectal bleeding and the patient was  discharged home on March 02, 2008.  His discharge condition is  significantly improved.  The patient was advised to continue off of his  Plavix and aspirin until re-evaluated in 2 weeks.      Margaretmary Bayley, M.D.  Electronically Signed     PC/MEDQ  D:  05/02/2008  T:  05/02/2008  Job:  [id number]

## 2010-07-31 ENCOUNTER — Emergency Department (HOSPITAL_COMMUNITY): Payer: 59

## 2010-07-31 ENCOUNTER — Emergency Department (HOSPITAL_COMMUNITY)
Admission: EM | Admit: 2010-07-31 | Discharge: 2010-07-31 | Disposition: A | Discharge status: Home / Self Care | Payer: 59 | Attending: Emergency Medicine | Admitting: Emergency Medicine

## 2010-07-31 DIAGNOSIS — M25519 Pain in unspecified shoulder: Secondary | ICD-10-CM | POA: Insufficient documentation

## 2010-07-31 DIAGNOSIS — M25569 Pain in unspecified knee: Secondary | ICD-10-CM | POA: Insufficient documentation

## 2010-07-31 DIAGNOSIS — I252 Old myocardial infarction: Secondary | ICD-10-CM | POA: Insufficient documentation

## 2010-07-31 DIAGNOSIS — Y92009 Unspecified place in unspecified non-institutional (private) residence as the place of occurrence of the external cause: Secondary | ICD-10-CM | POA: Insufficient documentation

## 2010-07-31 DIAGNOSIS — Z7901 Long term (current) use of anticoagulants: Secondary | ICD-10-CM | POA: Insufficient documentation

## 2010-07-31 DIAGNOSIS — I25810 Atherosclerosis of coronary artery bypass graft(s) without angina pectoris: Secondary | ICD-10-CM | POA: Insufficient documentation

## 2010-07-31 DIAGNOSIS — E119 Type 2 diabetes mellitus without complications: Secondary | ICD-10-CM | POA: Insufficient documentation

## 2010-07-31 DIAGNOSIS — W010XXA Fall on same level from slipping, tripping and stumbling without subsequent striking against object, initial encounter: Secondary | ICD-10-CM | POA: Insufficient documentation

## 2010-07-31 DIAGNOSIS — I10 Essential (primary) hypertension: Secondary | ICD-10-CM | POA: Insufficient documentation

## 2010-07-31 DIAGNOSIS — T1490XA Injury, unspecified, initial encounter: Secondary | ICD-10-CM | POA: Insufficient documentation

## 2010-11-26 LAB — BASIC METABOLIC PANEL
BUN: 17
CO2: 27
Chloride: 106
GFR calc Af Amer: >60
GFR calc non Af Amer: 58 — ABNORMAL LOW
Glucose, Bld: 122 — ABNORMAL HIGH
Sodium: 139

## 2010-11-26 LAB — URINALYSIS, ROUTINE W REFLEX MICROSCOPIC: Specific Gravity, Urine: 1.03

## 2010-11-26 LAB — URINE MICROSCOPIC-ADD ON

## 2010-12-10 LAB — CROSSMATCH
ABO/RH(D): B POS
Antibody Screen: NEGATIVE

## 2010-12-10 LAB — CBC
HCT: 25.8 % — ABNORMAL LOW (ref 39.0–52.0)
HCT: 27.1 % — ABNORMAL LOW (ref 39.0–52.0)
HCT: 31.4 % — ABNORMAL LOW (ref 39.0–52.0)
HCT: 32.6 % — ABNORMAL LOW (ref 39.0–52.0)
Hemoglobin: 10.3 g/dL — ABNORMAL LOW (ref 13.0–17.0)
Hemoglobin: 11.5 g/dL — ABNORMAL LOW (ref 13.0–17.0)
Hemoglobin: 11.6 g/dL — ABNORMAL LOW (ref 13.0–17.0)
Hemoglobin: 8.7 g/dL — ABNORMAL LOW (ref 13.0–17.0)
Hemoglobin: 8.9 g/dL — ABNORMAL LOW (ref 13.0–17.0)
MCHC: 32.8 g/dL (ref 30.0–36.0)
MCHC: 32.9 g/dL (ref 30.0–36.0)
MCHC: 33.3 g/dL (ref 30.0–36.0)
MCV: 93.7 fL (ref 78.0–100.0)
MCV: 94.3 fL (ref 78.0–100.0)
MCV: 95.1 fL (ref 78.0–100.0)
Platelets: 197 10*3/uL (ref 150–400)
Platelets: 207 10*3/uL (ref 150–400)
Platelets: 215 K/uL (ref 150–400)
Platelets: 222 K/uL (ref 150–400)
RBC: 2.76 MIL/uL — ABNORMAL LOW (ref 4.22–5.81)
RBC: 2.81 MIL/uL — ABNORMAL LOW (ref 4.22–5.81)
RBC: 2.87 MIL/uL — ABNORMAL LOW (ref 4.22–5.81)
RBC: 3.3 MIL/uL — ABNORMAL LOW (ref 4.22–5.81)
RBC: 3.42 MIL/uL — ABNORMAL LOW (ref 4.22–5.81)
RBC: 3.74 MIL/uL — ABNORMAL LOW (ref 4.22–5.81)
RDW: 13.5 % (ref 11.5–15.5)
RDW: 13.5 % (ref 11.5–15.5)
RDW: 13.6 % (ref 11.5–15.5)
RDW: 13.7 % (ref 11.5–15.5)
RDW: 13.8 % (ref 11.5–15.5)
RDW: 13.8 % (ref 11.5–15.5)
WBC: 10.5 10*3/uL (ref 4.0–10.5)
WBC: 11.6 K/uL — ABNORMAL HIGH (ref 4.0–10.5)
WBC: 11.7 K/uL — ABNORMAL HIGH (ref 4.0–10.5)
WBC: 9.2 10*3/uL (ref 4.0–10.5)
WBC: 9.6 10*3/uL (ref 4.0–10.5)
WBC: 9.6 10*3/uL (ref 4.0–10.5)

## 2010-12-10 LAB — GLUCOSE, CAPILLARY
Glucose-Capillary: 103 mg/dL — ABNORMAL HIGH (ref 70–99)
Glucose-Capillary: 105 mg/dL — ABNORMAL HIGH (ref 70–99)
Glucose-Capillary: 112 mg/dL — ABNORMAL HIGH (ref 70–99)
Glucose-Capillary: 129 mg/dL — ABNORMAL HIGH (ref 70–99)
Glucose-Capillary: 134 mg/dL — ABNORMAL HIGH (ref 70–99)
Glucose-Capillary: 145 mg/dL — ABNORMAL HIGH (ref 70–99)
Glucose-Capillary: 149 mg/dL — ABNORMAL HIGH (ref 70–99)
Glucose-Capillary: 155 mg/dL — ABNORMAL HIGH (ref 70–99)
Glucose-Capillary: 197 mg/dL — ABNORMAL HIGH (ref 70–99)
Glucose-Capillary: 88 mg/dL (ref 70–99)
Glucose-Capillary: 89 mg/dL (ref 70–99)
Glucose-Capillary: 97 mg/dL (ref 70–99)

## 2010-12-10 LAB — COMPREHENSIVE METABOLIC PANEL WITH GFR
ALT: 12 U/L (ref 0–53)
AST: 19 U/L (ref 0–37)
Albumin: 3.4 g/dL — ABNORMAL LOW (ref 3.5–5.2)
Alkaline Phosphatase: 43 U/L (ref 39–117)
BUN: 23 mg/dL (ref 6–23)
CO2: 25 meq/L (ref 19–32)
Calcium: 9 mg/dL (ref 8.4–10.5)
Chloride: 109 meq/L (ref 96–112)
Creatinine, Ser: 1.37 mg/dL (ref 0.4–1.5)
GFR calc Af Amer: >60 mL/min (ref >60)
GFR calc non Af Amer: 50 mL/min — ABNORMAL LOW (ref >60)
Glucose, Bld: 209 mg/dL — ABNORMAL HIGH (ref 70–99)
Potassium: 4.6 meq/L (ref 3.5–5.1)
Sodium: 139 meq/L (ref 135–145)
Total Bilirubin: 0.7 mg/dL (ref 0.3–1.2)
Total Protein: 6.2 g/dL (ref 6.0–8.3)

## 2010-12-10 LAB — URINALYSIS, ROUTINE W REFLEX MICROSCOPIC
Glucose, UA: NEGATIVE mg/dL
Hgb urine dipstick: NEGATIVE
Ketones, ur: 15 mg/dL — AB
Nitrite: NEGATIVE
Protein, ur: NEGATIVE mg/dL
Specific Gravity, Urine: 1.026 (ref 1.005–1.030)
Urobilinogen, UA: 0.2 mg/dL (ref 0.0–1.0)
pH: 5 (ref 5.0–8.0)

## 2010-12-10 LAB — BASIC METABOLIC PANEL
Calcium: 8.7 mg/dL (ref 8.4–10.5)
GFR calc Af Amer: >60 mL/min (ref >60)
GFR calc non Af Amer: 59 mL/min — ABNORMAL LOW (ref >60)
Glucose, Bld: 155 mg/dL — ABNORMAL HIGH (ref 70–99)
Sodium: 135 mEq/L (ref 135–145)

## 2010-12-10 LAB — T4, FREE: Free T4: 1.08 ng/dL (ref 0.89–1.80)

## 2010-12-10 LAB — ABO/RH: ABO/RH(D): B POS

## 2010-12-10 LAB — PROTIME-INR
INR: 1.1 (ref 0.00–1.49)
Prothrombin Time: 14.7 seconds (ref 11.6–15.2)

## 2010-12-10 LAB — DIFFERENTIAL
Basophils Absolute: 0 10*3/uL (ref 0.0–0.1)
Eosinophils Absolute: 0.3 10*3/uL (ref 0.0–0.7)
Eosinophils Relative: 3 % (ref 0–5)

## 2010-12-10 LAB — TSH: TSH: 0.69 u[IU]/mL (ref 0.350–4.500)

## 2010-12-10 LAB — APTT: aPTT: 24 s (ref 24–37)

## 2010-12-10 LAB — AFP TUMOR MARKER: AFP-Tumor Marker: 2.2 ng/mL (ref 0.0–8.0)

## 2011-04-08 ENCOUNTER — Other Ambulatory Visit: Payer: Self-pay | Admitting: Neurosurgery

## 2011-04-08 DIAGNOSIS — M48061 Spinal stenosis, lumbar region without neurogenic claudication: Secondary | ICD-10-CM

## 2011-04-16 ENCOUNTER — Ambulatory Visit
Admission: RE | Admit: 2011-04-16 | Discharge: 2011-04-16 | Disposition: A | Discharge status: Home / Self Care | Payer: 59 | Source: Ambulatory Visit | Attending: Neurosurgery | Admitting: Neurosurgery

## 2011-04-16 DIAGNOSIS — M48061 Spinal stenosis, lumbar region without neurogenic claudication: Secondary | ICD-10-CM

## 2011-07-15 ENCOUNTER — Inpatient Hospital Stay (HOSPITAL_COMMUNITY)
Admission: EM | Admit: 2011-07-15 | Discharge: 2011-07-20 | DRG: 069 | Disposition: A | Discharge status: Skilled Nursing Facility | Payer: Medicare Other | Source: Ambulatory Visit | Attending: Family Medicine | Admitting: Family Medicine

## 2011-07-15 ENCOUNTER — Emergency Department (HOSPITAL_COMMUNITY): Payer: Medicare Other

## 2011-07-15 ENCOUNTER — Encounter (HOSPITAL_COMMUNITY): Payer: Self-pay | Admitting: Radiology

## 2011-07-15 DIAGNOSIS — F0280 Dementia in other diseases classified elsewhere without behavioral disturbance: Secondary | ICD-10-CM | POA: Diagnosis present

## 2011-07-15 DIAGNOSIS — Z951 Presence of aortocoronary bypass graft: Secondary | ICD-10-CM

## 2011-07-15 DIAGNOSIS — G8190 Hemiplegia, unspecified affecting unspecified side: Secondary | ICD-10-CM | POA: Diagnosis present

## 2011-07-15 DIAGNOSIS — E860 Dehydration: Secondary | ICD-10-CM | POA: Diagnosis present

## 2011-07-15 DIAGNOSIS — I639 Cerebral infarction, unspecified: Secondary | ICD-10-CM

## 2011-07-15 DIAGNOSIS — E119 Type 2 diabetes mellitus without complications: Secondary | ICD-10-CM

## 2011-07-15 DIAGNOSIS — F70 Mild intellectual disabilities: Secondary | ICD-10-CM | POA: Diagnosis present

## 2011-07-15 DIAGNOSIS — N179 Acute kidney failure, unspecified: Secondary | ICD-10-CM | POA: Diagnosis present

## 2011-07-15 DIAGNOSIS — R269 Unspecified abnormalities of gait and mobility: Secondary | ICD-10-CM

## 2011-07-15 DIAGNOSIS — N289 Disorder of kidney and ureter, unspecified: Secondary | ICD-10-CM | POA: Diagnosis present

## 2011-07-15 DIAGNOSIS — I959 Hypotension, unspecified: Secondary | ICD-10-CM | POA: Diagnosis present

## 2011-07-15 DIAGNOSIS — I498 Other specified cardiac arrhythmias: Secondary | ICD-10-CM | POA: Diagnosis present

## 2011-07-15 DIAGNOSIS — I5030 Unspecified diastolic (congestive) heart failure: Secondary | ICD-10-CM | POA: Diagnosis present

## 2011-07-15 DIAGNOSIS — M109 Gout, unspecified: Secondary | ICD-10-CM | POA: Diagnosis present

## 2011-07-15 DIAGNOSIS — Z8673 Personal history of transient ischemic attack (TIA), and cerebral infarction without residual deficits: Secondary | ICD-10-CM

## 2011-07-15 DIAGNOSIS — I509 Heart failure, unspecified: Secondary | ICD-10-CM | POA: Diagnosis present

## 2011-07-15 DIAGNOSIS — E785 Hyperlipidemia, unspecified: Secondary | ICD-10-CM | POA: Diagnosis present

## 2011-07-15 DIAGNOSIS — G459 Transient cerebral ischemic attack, unspecified: Secondary | ICD-10-CM

## 2011-07-15 DIAGNOSIS — I2510 Atherosclerotic heart disease of native coronary artery without angina pectoris: Secondary | ICD-10-CM | POA: Diagnosis present

## 2011-07-15 DIAGNOSIS — N189 Chronic kidney disease, unspecified: Secondary | ICD-10-CM | POA: Diagnosis present

## 2011-07-15 HISTORY — DX: Cerebral infarction, unspecified: I63.9

## 2011-07-15 LAB — COMPREHENSIVE METABOLIC PANEL
ALT: 8 U/L (ref 0–53)
AST: 14 U/L (ref 0–37)
Alkaline Phosphatase: 58 U/L (ref 39–117)
CO2: 25 mEq/L (ref 19–32)
Calcium: 9.9 mg/dL (ref 8.4–10.5)
Chloride: 99 mEq/L (ref 96–112)
GFR calc Af Amer: 38 mL/min — ABNORMAL LOW (ref >90)
GFR calc non Af Amer: 33 mL/min — ABNORMAL LOW (ref >90)
Glucose, Bld: 134 mg/dL — ABNORMAL HIGH (ref 70–99)
Sodium: 134 mEq/L — ABNORMAL LOW (ref 135–145)
Total Bilirubin: 0.4 mg/dL (ref 0.3–1.2)

## 2011-07-15 LAB — POCT I-STAT, CHEM 8
Calcium, Ion: 1.19 mmol/L (ref 1.12–1.32)
Chloride: 106 mEq/L (ref 96–112)
Creatinine, Ser: 2 mg/dL — ABNORMAL HIGH (ref 0.50–1.35)
Glucose, Bld: 138 mg/dL — ABNORMAL HIGH (ref 70–99)
Potassium: 5.2 mEq/L — ABNORMAL HIGH (ref 3.5–5.1)

## 2011-07-15 LAB — DIFFERENTIAL
Blasts: 0 %
Lymphocytes Relative: 32 % (ref 12–46)
Metamyelocytes Relative: 0 %
Monocytes Absolute: 0.1 10*3/uL (ref 0.1–1.0)
Monocytes Relative: 1 % — ABNORMAL LOW (ref 3–12)
Promyelocytes Absolute: 0 %
nRBC: 0 /100 WBC

## 2011-07-15 LAB — CBC
MCHC: 33.5 g/dL (ref 30.0–36.0)
MCV: 93.5 fL (ref 78.0–100.0)
Platelets: 241 10*3/uL (ref 150–400)
RDW: 13.6 % (ref 11.5–15.5)
WBC: 10.5 10*3/uL (ref 4.0–10.5)

## 2011-07-15 LAB — CK TOTAL AND CKMB (NOT AT ARMC): Relative Index: INVALID (ref 0.0–2.5)

## 2011-07-15 LAB — PROTIME-INR: Prothrombin Time: 13.7 seconds (ref 11.6–15.2)

## 2011-07-15 LAB — APTT: aPTT: 27 seconds (ref 24–37)

## 2011-07-15 LAB — GLUCOSE, CAPILLARY

## 2011-07-15 MED ORDER — SODIUM CHLORIDE 0.9 % IV SOLN
INTRAVENOUS | Status: DC
  Administered 2011-07-15: 23:00:00 via INTRAVENOUS

## 2011-07-15 NOTE — Code Documentation (Signed)
78yo bm brought in via [facility] for stroke symptoms.  Pt went with his grandson for a haircut & returned home at 1600 in his normal state of health.  He sat down in the chair where he remained until his wife called him to dinner.  At that time he was unable to stand and appeared more confused & his wife called EMS.  She stated he appeared weak on the right at the time EMS was called.  NIH 5 for aphasia, dysarthria, mild facial droop, & inability to answer questions.  Code stroke called 2109, pt arrival 2108, EDP exam 2108, stroke team arrival 2116, LSN 1600, pt arrival in CT 2110, phlebotomist arrival 2117, code stroke cancelled 2135 by Dr. Thad Ranger.

## 2011-07-15 NOTE — ED Provider Notes (Signed)
History     CSN: [id number]  Arrival date & time 07/15/11  2108   First MD Initiated Contact with Patient 07/15/11 2111      Chief Complaint  Patient presents with  . Code Stroke    (Consider location/radiation/quality/duration/timing/severity/associated sxs/prior treatment) The history is provided by the spouse and the EMS personnel. The history is limited by the condition of the patient (dementia).   78 year old male was brought in by ambulance as a possible stroke. His wife says that he returned home from being out with their grandson and was walking normally. This was at about 4 PM. At about 5:30, she noted that he was unable to walk with his walker and he was more confused than normal. He has baseline dementia. He is voicing no complaints.  Past Medical History  Diagnosis Date  . Diabetes mellitus   . Stroke   . Coronary artery disease     Past Surgical History  Procedure Date  . Coronary artery bypass graft   . Coronary stent placement     No family history on file.  History  Substance Use Topics  . Smoking status: Never Smoker   . Smokeless tobacco: Not on file  . Alcohol Use: No      Review of Systems  Unable to perform ROS: Dementia    Allergies  Penicillins  Home Medications   Current Outpatient Rx  Name Route Sig Dispense Refill  . ALLOPURINOL 100 MG PO TABS Oral Take 100 mg by mouth daily.    . ASPIRIN EC 81 MG PO TBEC Oral Take 81 mg by mouth daily.    Marland Kitchen CLOPIDOGREL BISULFATE 75 MG PO TABS Oral Take 75 mg by mouth daily.    Marland Kitchen DILTIAZEM HCL ER 180 MG PO CP24 Oral Take 180 mg by mouth daily.    . DUTASTERIDE 0.5 MG PO CAPS Oral Take 0.5 mg by mouth daily.    . OMEGA-3 FATTY ACIDS 1000 MG PO CAPS Oral Take 1 g by mouth daily.    Marland Kitchen GLIMEPIRIDE 4 MG PO TABS Oral Take 4 mg by mouth daily before breakfast.    . LANSOPRAZOLE 30 MG PO CPDR Oral Take 30 mg by mouth daily.    Marland Kitchen CITRUCEL PO Oral Take 2 tablets by mouth daily.    Marland Kitchen NABUMETONE 500 MG PO  TABS Oral Take 500 mg by mouth 2 (two) times daily.    Marland Kitchen ROSUVASTATIN CALCIUM 20 MG PO TABS Oral Take 20 mg by mouth daily.    Marland Kitchen SOLIFENACIN SUCCINATE 10 MG PO TABS Oral Take 10 mg by mouth daily.    Marland Kitchen VALSARTAN 160 MG PO TABS Oral Take 160 mg by mouth daily.      BP 105/50  Pulse 64  Temp(Src) 98.1 F (36.7 C) (Oral)  Resp 13  SpO2 97%  Physical Exam  Nursing note and vitals reviewed. 78 year old male who is resting comfortably in no acute distress. Vital signs are normal. Oxygen saturation is 97% which is normal. Head is normocephalic and atraumatic. PERRLA, EOMI. He he does not cooperate well for funduscopic exam. There is no obvious facial asymmetry. Neck is nontender and supple without adenopathy, JVD, or bruit. Lungs are clear without rales, wheezes, or rhonchi. Heart has regular rate and rhythm without murmur. Abdomen is soft, flat, nontender without masses or hepatosplenomegaly. Extremities have no cyanosis or edema, full range of motion is present. Skin is warm and dry without rash. Neurologic: He is awake and alert. He speaks  a little but speech is not dysarthric. He is very slow to follow commands but this is apparently what he is at his baseline. Cranial nerves are grossly intact. He has mild left arm pronator drift and there is mild weakness of the left arm and left leg each 4/5. Deep tendon reflexes are symmetric.  ED Course  Procedures (including critical care time)  Results for orders placed during the hospital encounter of 07/15/11  PROTIME-INR      Component Value Range   Prothrombin Time 13.7  11.6 - 15.2 (seconds)   INR 1.03  0.00 - 1.49   APTT      Component Value Range   aPTT 27  24 - 37 (seconds)  CBC      Component Value Range   WBC 10.5  4.0 - 10.5 (K/uL)   RBC 3.86 (*) 4.22 - 5.81 (MIL/uL)   Hemoglobin 12.1 (*) 13.0 - 17.0 (g/dL)   HCT 16.1 (*) 09.6 - 52.0 (%)   MCV 93.5  78.0 - 100.0 (fL)   MCH 31.3  26.0 - 34.0 (pg)   MCHC 33.5  30.0 - 36.0 (g/dL)    RDW 04.5  40.9 - 81.1 (%)   Platelets 241  150 - 400 (K/uL)  DIFFERENTIAL      Component Value Range   Neutrophils Relative PENDING  43 - 77 (%)   Neutro Abs PENDING  1.7 - 7.7 (K/uL)   Band Neutrophils PENDING  0 - 10 (%)   Lymphocytes Relative PENDING  12 - 46 (%)   Lymphs Abs PENDING  0.7 - 4.0 (K/uL)   Monocytes Relative PENDING  3 - 12 (%)   Monocytes Absolute PENDING  0.1 - 1.0 (K/uL)   Eosinophils Relative PENDING  0 - 5 (%)   Eosinophils Absolute PENDING  0.0 - 0.7 (K/uL)   Basophils Relative PENDING  0 - 1 (%)   Basophils Absolute PENDING  0.0 - 0.1 (K/uL)   WBC Morphology PENDING     RBC Morphology PENDING     Smear Review PENDING     nRBC PENDING  0 (/100 WBC)   Metamyelocytes Relative PENDING     Myelocytes PENDING     Promyelocytes Absolute PENDING     Blasts PENDING    COMPREHENSIVE METABOLIC PANEL      Component Value Range   Sodium 134 (*) 135 - 145 (mEq/L)   Potassium 5.1  3.5 - 5.1 (mEq/L)   Chloride 99  96 - 112 (mEq/L)   CO2 25  19 - 32 (mEq/L)   Glucose, Bld 134 (*) 70 - 99 (mg/dL)   BUN 43 (*) 6 - 23 (mg/dL)   Creatinine, Ser 9.14 (*) 0.50 - 1.35 (mg/dL)   Calcium 9.9  8.4 - 78.2 (mg/dL)   Total Protein 7.4  6.0 - 8.3 (g/dL)   Albumin 3.8  3.5 - 5.2 (g/dL)   AST 14  0 - 37 (U/L)   ALT 8  0 - 53 (U/L)   Alkaline Phosphatase 58  39 - 117 (U/L)   Total Bilirubin 0.4  0.3 - 1.2 (mg/dL)   GFR calc non Af Amer 33 (*) >90 (mL/min)   GFR calc Af Amer 38 (*) >90 (mL/min)  CK TOTAL AND CKMB      Component Value Range   Total CK PENDING  7 - 232 (U/L)   CK, MB 2.2  0.3 - 4.0 (ng/mL)   Relative Index PENDING  0.0 - 2.5   TROPONIN I  Component Value Range   Troponin I <0.30  <0.30 (ng/mL)  POCT I-STAT, CHEM 8      Component Value Range   Sodium 137  135 - 145 (mEq/L)   Potassium 5.2 (*) 3.5 - 5.1 (mEq/L)   Chloride 106  96 - 112 (mEq/L)   BUN 42 (*) 6 - 23 (mg/dL)   Creatinine, Ser 4.09 (*) 0.50 - 1.35 (mg/dL)   Glucose, Bld 811 (*) 70 - 99 (mg/dL)    Calcium, Ion 9.14  1.12 - 1.32 (mmol/L)   TCO2 26  0 - 100 (mmol/L)   Hemoglobin 12.9 (*) 13.0 - 17.0 (g/dL)   HCT 78.2 (*) 95.6 - 52.0 (%)   Ct Head Wo Contrast  07/15/2011  *RADIOLOGY REPORT*  Clinical Data: Code stroke; confusion.  CT HEAD WITHOUT CONTRAST  Technique:  Contiguous axial images were obtained from the base of the skull through the vertex without contrast.  Comparison: None.  Findings: There is no evidence of acute infarction, mass lesion, or intra- or extra-axial hemorrhage on CT.  There is marked prominence of the supratentorial ventricles.  This could reflect normal pressure hydrocephalus, or possibly simply moderately severe cortical volume loss, given prominence of underlying sulci.  The prominence of the ventricles is somewhat out of proportion to sulcal prominence.  Mild cerebellar atrophy is noted.  Scattered periventricular and subcortical white matter change likely reflects small vessel ischemic microangiopathy.  The brainstem and fourth ventricle are within normal limits.  The basal ganglia are unremarkable in appearance.  The cerebral hemispheres demonstrate grossly normal gray-white differentiation. No mass effect or midline shift is seen.  There is no evidence of fracture; visualized osseous structures are unremarkable in appearance.  The visualized portions of the orbits are within normal limits.  The paranasal sinuses and mastoid air cells are well-aerated.  No significant soft tissue abnormalities are seen.  IMPRESSION:  1.  No acute intracranial pathology seen on CT. 2.  Marked prominence of the supratentorial ventricles, somewhat out of proportion to sulcal prominence.  This could reflect normal pressure hydrocephalous, or possibly simply moderately severe cortical volume loss.  Suggest clinical correlation for associated symptoms. 3.  Scattered small vessel ischemic microangiopathy.  These results were called by telephone on 07/15/2011  at  09:27 p.m. to  Dr. Dione Booze,  who verbally acknowledged these results.  Original Report Authenticated By: Tonia Ghent, M.D.    Date: 07/15/2011  Rate: 67  Rhythm: normal sinus rhythm and premature atrial contractions (PAC)  QRS Axis: normal  Intervals: normal  ST/T Wave abnormalities: normal  Conduction Disutrbances:none  Narrative Interpretation: Low QRS voltage. When compared with ECG of 02/27/2008, voltage has decreased but no other significant changes are noted.  Old EKG Reviewed: unchanged    Patient was seen by Dr. Thad Ranger of [facility] who has canceled a code stroke. Patient will need medical admission for stroke. It is noted that he has significant renal insufficiency which is apparently new.  Case is discussed with Dr.Garba of [facility] who agrees to admit the patient.  1. Stroke   2. Renal insufficiency    CRITICAL CARE Performed by: Dione Booze   Total critical care time: 35 minutes  Critical care time was exclusive of separately billable procedures and treating other patients.  Critical care was necessary to treat or prevent imminent or life-threatening deterioration.  Critical care was time spent personally by me on the following activities: development of treatment plan with patient and/or surrogate as well as nursing,  discussions with consultants, evaluation of patient's response to treatment, examination of patient, obtaining history from patient or surrogate, ordering and performing treatments and interventions, ordering and review of laboratory studies, ordering and review of radiographic studies, pulse oximetry and re-evaluation of patient's condition.   MDM  Stroke which appears to be right hemisphere causing left hemiparesis. Code stroke was activated.        Dione Booze, MD 07/15/11 2250

## 2011-07-15 NOTE — Consult Note (Signed)
Referring Physician: Preston Fleeting     Chief Complaint: Difficultywith gait, ? Right sided weakness  HPI: Albert Nelson is an 78 y.o. male who was out with his son today and was at baseline.  Was able to ambulate at [facility] when he went to sit down. At supper time when he was attempting to get up to eat was unable to get up from the chair and ambulate.  When he did get up seemed to be dragging the right.  EMS was called at that time and patient was brought in as a code stoke.    LSN: [id number] tPA Given: No: Outside time window  Past Medical History  Diagnosis Date  . Diabetes mellitus   . Stroke   . Coronary artery disease     Past Surgical History  Procedure Date  . Coronary artery bypass graft   . Coronary stent placement     No family history on file. Social History:  reports that he has never smoked. He does not have any smokeless tobacco history on file. He reports that he does not drink alcohol. His drug history not on file.  Allergies:  Allergies  Allergen Reactions  . Penicillins     Medications: I have reviewed the patient's current medications. Prior to Admission:  Zyloprim, ASA, Plavix, Diltiazem, Avodart, Fish Oil, Amaryl, Prevacid, Citrucel, Relafen, Crestor, Vesicare, Diovan  ROS: History obtained from the patient  General ROS: negative for - chills, fatigue, fever, night sweats, weight gain or weight loss Psychological ROS: occasional confusion Ophthalmic ROS: wears corrective lens ENT ROS: negative for - epistaxis, nasal discharge, oral lesions, sore throat, tinnitus or vertigo Allergy and Immunology ROS: negative for - hives or itchy/watery eyes Hematological and Lymphatic ROS: negative for - bleeding problems, bruising or swollen lymph nodes Endocrine ROS: negative for - galactorrhea, hair pattern changes, polydipsia/polyuria or temperature intolerance Respiratory ROS: negative for - cough, hemoptysis, shortness of breath or wheezing Cardiovascular ROS: negative for -  chest pain, dyspnea on exertion, edema or irregular heartbeat Gastrointestinal ROS: negative for - abdominal pain, diarrhea, hematemesis, nausea/vomiting or stool incontinence Genito-Urinary QMV:HQIONGE incontinence  Musculoskeletal ROS: left arm pain Neurological ROS: as noted in HPI Dermatological ROS: negative for rash and skin lesion changes  Physical Examination: Blood pressure 118/55, pulse 72, temperature 98.1 F (36.7 C), temperature source Oral, resp. rate 16, SpO2 98.00%.  Neurologic Examination: Mental Status: Alert and awake.  Speech fluent without evidence of aphasia.  Some mild slurring of speech noted.  Able to follow 3 step commands without difficulty. Cranial Nerves: II: visual fields grossly normal III,IV, VI: ptosis not present, extra-ocular motions intact bilaterally V,VII: smile symmetric, facial light touch sensation normal bilaterally VIII: hearing normal bilaterally IX,X: gag reflex present XI: trapezius strength/neck flexion strength normal bilaterally XII: tongue strength normal  Motor: Right : Upper extremity   5/5    Left:     Upper extremity - Difficult to test secondary to pain but able to lift against gravity  Lower extremity   5/5     Lower extremity   5/5 Tone and bulk:normal tone throughout; no atrophy noted Sensory: Pinprick and light touch intact throughout, bilaterally Deep Tendon Reflexes: 2+ in the upper extremities, trace at the knees and absent at the ankles Plantars: Right: mute   Left: mute Cerebellar: normal finger-to-nose and normal heel-to-shin test   Results for orders placed during the hospital encounter of 07/15/11 (from the past 48 hour(s))  POCT I-STAT, CHEM 8  Status: Abnormal   Collection Time   07/15/11  9:26 PM      Component Value Range Comment   Sodium 137  135 - 145 (mEq/L)    Potassium 5.2 (*) 3.5 - 5.1 (mEq/L)    Chloride 106  96 - 112 (mEq/L)    BUN 42 (*) 6 - 23 (mg/dL)    Creatinine, Ser 5.62 (*) 0.50 - 1.35  (mg/dL)    Glucose, Bld 130 (*) 70 - 99 (mg/dL)    Calcium, Ion 8.65  1.12 - 1.32 (mmol/L)    TCO2 26  0 - 100 (mmol/L)    Hemoglobin 12.9 (*) 13.0 - 17.0 (g/dL)    HCT 78.4 (*) 69.6 - 52.0 (%)    Ct Head Wo Contrast  07/15/2011  *RADIOLOGY REPORT*  Clinical Data: Code stroke; confusion.  CT HEAD WITHOUT CONTRAST  Technique:  Contiguous axial images were obtained from the base of the skull through the vertex without contrast.  Comparison: None.  Findings: There is no evidence of acute infarction, mass lesion, or intra- or extra-axial hemorrhage on CT.  There is marked prominence of the supratentorial ventricles.  This could reflect normal pressure hydrocephalus, or possibly simply moderately severe cortical volume loss, given prominence of underlying sulci.  The prominence of the ventricles is somewhat out of proportion to sulcal prominence.  Mild cerebellar atrophy is noted.  Scattered periventricular and subcortical white matter change likely reflects small vessel ischemic microangiopathy.  The brainstem and fourth ventricle are within normal limits.  The basal ganglia are unremarkable in appearance.  The cerebral hemispheres demonstrate grossly normal gray-white differentiation. No mass effect or midline shift is seen.  There is no evidence of fracture; visualized osseous structures are unremarkable in appearance.  The visualized portions of the orbits are within normal limits.  The paranasal sinuses and mastoid air cells are well-aerated.  No significant soft tissue abnormalities are seen.  IMPRESSION:  1.  No acute intracranial pathology seen on CT. 2.  Marked prominence of the supratentorial ventricles, somewhat out of proportion to sulcal prominence.  This could reflect normal pressure hydrocephalous, or possibly simply moderately severe cortical volume loss.  Suggest clinical correlation for associated symptoms. 3.  Scattered small vessel ischemic microangiopathy.  These results were called by  telephone on 07/15/2011  at  09:27 p.m. to  Dr. Dione Booze, who verbally acknowledged these results.  Original Report Authenticated By: Tonia Ghent, M.D.    Assessment: 78 y.o. male presenting with difficulty with gait and right sided weakness.  Seems to be at baseline at this time.  Outside time window for tPA.  Questionable left hemispheric TIA.  Patient with multiple risk factors.    Stroke Risk Factors - diabetes mellitus and stroke in the past  Plan: 1. HgbA1c, fasting lipid panel 2. MRI, MRA  of the brain without contrast 3. PT consult, OT consult, Speech consult 4. Echocardiogram 5. Carotid dopplers 6. Prophylactic therapy-continue ASA and Plavix 7. Risk factor modification 8. Telemetry monitoring 9. Frequent neuro checks   Thana Farr, MD [facility] [phone] 07/15/2011, 9:40 PM

## 2011-07-15 NOTE — ED Notes (Signed)
Per the patient wife, the patient had gone off with his son. When he returned home, he went to the chair and she called him to get up. He sat there about an hour or so and when he got up, he was confused, difficulty ambulating and appeared to have weakness in the right leg

## 2011-07-15 NOTE — ED Notes (Signed)
CBG done at bedside.  CBG = 131

## 2011-07-16 ENCOUNTER — Encounter (HOSPITAL_COMMUNITY): Payer: Self-pay | Admitting: *Deleted

## 2011-07-16 ENCOUNTER — Inpatient Hospital Stay (HOSPITAL_COMMUNITY): Payer: Medicare Other

## 2011-07-16 DIAGNOSIS — I959 Hypotension, unspecified: Secondary | ICD-10-CM

## 2011-07-16 DIAGNOSIS — E860 Dehydration: Secondary | ICD-10-CM

## 2011-07-16 DIAGNOSIS — G459 Transient cerebral ischemic attack, unspecified: Secondary | ICD-10-CM

## 2011-07-16 DIAGNOSIS — R269 Unspecified abnormalities of gait and mobility: Secondary | ICD-10-CM

## 2011-07-16 DIAGNOSIS — E119 Type 2 diabetes mellitus without complications: Secondary | ICD-10-CM

## 2011-07-16 LAB — LIPID PANEL
LDL Cholesterol: 54 mg/dL (ref 0–99)
Total CHOL/HDL Ratio: 4.1 RATIO
Triglycerides: 122 mg/dL (ref <150)
VLDL: 24 mg/dL (ref 0–40)

## 2011-07-16 LAB — GLUCOSE, CAPILLARY
Glucose-Capillary: 184 mg/dL — ABNORMAL HIGH (ref 70–99)
Glucose-Capillary: 59 mg/dL — ABNORMAL LOW (ref 70–99)
Glucose-Capillary: 78 mg/dL (ref 70–99)

## 2011-07-16 LAB — HEMOGLOBIN A1C
Hgb A1c MFr Bld: 7.1 % — ABNORMAL HIGH (ref <5.7)
Mean Plasma Glucose: 157 mg/dL — ABNORMAL HIGH (ref <117)

## 2011-07-16 LAB — RAPID URINE DRUG SCREEN, HOSP PERFORMED: Benzodiazepines: NOT DETECTED

## 2011-07-16 MED ORDER — OMEGA-3 FATTY ACIDS 1000 MG PO CAPS: 1.0000 g | ORAL_CAPSULE | Freq: Every day | ORAL | Status: DC

## 2011-07-16 MED ORDER — ASPIRIN EC 81 MG PO TBEC
81.0000 mg | DELAYED_RELEASE_TABLET | Freq: Every day | ORAL | Status: DC
  Administered 2011-07-16 – 2011-07-20 (×5): 81 mg via ORAL
  Filled 2011-07-16 (×5): qty 1

## 2011-07-16 MED ORDER — GLIMEPIRIDE 4 MG PO TABS
4.0000 mg | ORAL_TABLET | Freq: Every day | ORAL | Status: DC
  Filled 2011-07-16 (×2): qty 1

## 2011-07-16 MED ORDER — IRBESARTAN 75 MG PO TABS
75.0000 mg | ORAL_TABLET | Freq: Every day | ORAL | Status: DC
  Filled 2011-07-16: qty 1

## 2011-07-16 MED ORDER — CLOPIDOGREL BISULFATE 75 MG PO TABS
75.0000 mg | ORAL_TABLET | Freq: Every day | ORAL | Status: DC
  Administered 2011-07-16 – 2011-07-20 (×5): 75 mg via ORAL
  Filled 2011-07-16 (×5): qty 1

## 2011-07-16 MED ORDER — SODIUM CHLORIDE 0.9 % IV SOLN
INTRAVENOUS | Status: DC
  Administered 2011-07-16: 1000 mL via INTRAVENOUS
  Administered 2011-07-17 – 2011-07-18 (×4): via INTRAVENOUS
  Administered 2011-07-18: 50 mL via INTRAVENOUS

## 2011-07-16 MED ORDER — DILTIAZEM HCL ER 180 MG PO CP24
180.0000 mg | ORAL_CAPSULE | Freq: Every day | ORAL | Status: DC
  Filled 2011-07-16: qty 1

## 2011-07-16 MED ORDER — DUTASTERIDE 0.5 MG PO CAPS
0.5000 mg | ORAL_CAPSULE | Freq: Every day | ORAL | Status: DC
  Administered 2011-07-16 – 2011-07-20 (×5): 0.5 mg via ORAL
  Filled 2011-07-16 (×5): qty 1

## 2011-07-16 MED ORDER — INSULIN ASPART 100 UNIT/ML ~~LOC~~ SOLN
0.0000 [IU] | Freq: Three times a day (TID) | SUBCUTANEOUS | Status: DC
  Administered 2011-07-16: 3 [IU] via SUBCUTANEOUS
  Administered 2011-07-17 – 2011-07-20 (×4): 2 [IU] via SUBCUTANEOUS

## 2011-07-16 MED ORDER — PANTOPRAZOLE SODIUM 20 MG PO TBEC
20.0000 mg | DELAYED_RELEASE_TABLET | Freq: Every day | ORAL | Status: DC
  Administered 2011-07-16 – 2011-07-20 (×5): 20 mg via ORAL
  Filled 2011-07-16 (×5): qty 1

## 2011-07-16 MED ORDER — LORAZEPAM 2 MG/ML IJ SOLN
1.0000 mg | Freq: Once | INTRAMUSCULAR | Status: AC
  Administered 2011-07-16: 1 mg via INTRAVENOUS
  Filled 2011-07-16 (×2): qty 1

## 2011-07-16 MED ORDER — NABUMETONE 500 MG PO TABS
500.0000 mg | ORAL_TABLET | Freq: Two times a day (BID) | ORAL | Status: DC
  Filled 2011-07-16 (×2): qty 1

## 2011-07-16 MED ORDER — DARIFENACIN HYDROBROMIDE ER 7.5 MG PO TB24
7.5000 mg | ORAL_TABLET | Freq: Every day | ORAL | Status: DC
  Administered 2011-07-16 – 2011-07-20 (×5): 7.5 mg via ORAL
  Filled 2011-07-16 (×5): qty 1

## 2011-07-16 MED ORDER — ATORVASTATIN CALCIUM 10 MG PO TABS
10.0000 mg | ORAL_TABLET | Freq: Every day | ORAL | Status: DC
  Administered 2011-07-16: 10 mg via ORAL
  Filled 2011-07-16 (×2): qty 1

## 2011-07-16 MED ORDER — ENOXAPARIN SODIUM 40 MG/0.4ML ~~LOC~~ SOLN
40.0000 mg | SUBCUTANEOUS | Status: DC
  Administered 2011-07-16 – 2011-07-20 (×5): 40 mg via SUBCUTANEOUS
  Filled 2011-07-16 (×5): qty 0.4

## 2011-07-16 MED ORDER — OMEGA-3-ACID ETHYL ESTERS 1 G PO CAPS
1.0000 g | ORAL_CAPSULE | Freq: Every day | ORAL | Status: DC
  Administered 2011-07-16 – 2011-07-20 (×5): 1 g via ORAL
  Filled 2011-07-16 (×5): qty 1

## 2011-07-16 NOTE — Progress Notes (Signed)
*  PRELIMINARY RESULTS* Vascular Ultrasound Carotid Duplex (Doppler) has been completed.  No evidence of internal carotid artery stenosis bilaterally. Bilateral external carotid arteries appear to be slightly dampened. Bilateral antegrade vertebral artery flow.  07/16/2011 12:32 PM Elpidio Galea, RDMS, RDCS

## 2011-07-16 NOTE — Progress Notes (Signed)
Subjective: Feeling ok, he has not been eating well recently. Relates pain all over.  No more weakness.  Objective: Filed Vitals:   07/15/11 2245 07/15/11 2332 07/16/11 0044 07/16/11 0450  BP: 117/65  108/54 113/51  Pulse: 64  64 59  Temp:  98.1 F (36.7 C) 97.8 F (36.6 C) 97.7 F (36.5 C)  TempSrc:   Oral Oral  Resp: 18  17 17   Weight:   84.3 kg (185 lb 13.6 oz)   SpO2: 100%  100% 98%   Weight change:    General: Alert, awake, oriented x3, in no acute distress.  HEENT: No bruits, no goiter.  Heart: Regular rate and rhythm, without murmurs, rubs, gallops.  Lungs: CTA, bilateral air movement.  Abdomen: Soft, nontender, nondistended, positive bowel sounds.  Neuro: Grossly intact, nonfocal. Left upper extremities difficult to assess due to pain.  Extremities; No edema.   Lab Results:  [facility] 07/15/11 2126 07/15/11 2117  NA 137 134*  K 5.2* 5.1  CL 106 99  CO2 -- 25  GLUCOSE 138* 134*  BUN 42* 43*  CREATININE 2.00* 1.80*  CALCIUM -- 9.9  MG -- --  PHOS -- --    Basename 07/15/11 2117  AST 14  ALT 8  ALKPHOS 58  BILITOT 0.4  PROT 7.4  ALBUMIN 3.8    Basename 07/15/11 2126 07/15/11 2117  WBC -- 10.5  NEUTROABS -- 6.9  HGB 12.9* 12.1*  HCT 38.0* 36.1*  MCV -- 93.5  PLT -- 241    Basename 07/15/11 2117  CKTOTAL 45  CKMB 2.2  CKMBINDEX --  TROPONINI <0.30    Basename 07/16/11 0455  CHOL 103  HDL 25*  LDLCALC 54  TRIG 161  CHOLHDL 4.1  LDLDIRECT --    Micro Results: No results found for this or any previous visit (from the past 240 hour(s)).  Studies/Results: Ct Head Wo Contrast  07/15/2011  *RADIOLOGY REPORT*  Clinical Data: Code stroke; confusion.  CT HEAD WITHOUT CONTRAST  Technique:  Contiguous axial images were obtained from the base of the skull through the vertex without contrast.  Comparison: None.  Findings: There is no evidence of acute infarction, mass lesion, or intra- or extra-axial hemorrhage on CT.  There is marked prominence of  the supratentorial ventricles.  This could reflect normal pressure hydrocephalus, or possibly simply moderately severe cortical volume loss, given prominence of underlying sulci.  The prominence of the ventricles is somewhat out of proportion to sulcal prominence.  Mild cerebellar atrophy is noted.  Scattered periventricular and subcortical white matter change likely reflects small vessel ischemic microangiopathy.  The brainstem and fourth ventricle are within normal limits.  The basal ganglia are unremarkable in appearance.  The cerebral hemispheres demonstrate grossly normal gray-white differentiation. No mass effect or midline shift is seen.  There is no evidence of fracture; visualized osseous structures are unremarkable in appearance.  The visualized portions of the orbits are within normal limits.  The paranasal sinuses and mastoid air cells are well-aerated.  No significant soft tissue abnormalities are seen.  IMPRESSION:  1.  No acute intracranial pathology seen on CT. 2.  Marked prominence of the supratentorial ventricles, somewhat out of proportion to sulcal prominence.  This could reflect normal pressure hydrocephalous, or possibly simply moderately severe cortical volume loss.  Suggest clinical correlation for associated symptoms. 3.  Scattered small vessel ischemic microangiopathy.  These results were called by telephone on 07/15/2011  at  09:27 p.m. to  Dr. Dione Booze, who verbally  acknowledged these results.  Original Report Authenticated By: Tonia Ghent, M.D.    Medications: I have reviewed the patient's current medications.  1-Right hemiparesis, differential : TIA, Stroke, vs secondary to hypotension. Work up for stroke in process. PT, OT, swallow evaluation. Symptoms resolved. I will discontinue MRA with contrast due to renal failure.  Continue with aspirin and plavix. Neuro consulted yesterday.  2-Hypotension: I will hold Diovan, Diltiazem. Continue with IV fluids. SBP 100 range. No  leukocytosis, no evidence of infection.  3-Acute on chronic renal failure: In setting of hypotension, Diovan and NSAID, component ATN. Diovan, Relafen and Cardizem stopped. Strict I and O. Continue with IV fluids.  4-Bradycardia; Holding Cardizem. When BP improve and HR increase will need to restart Cardizem at lower dose.  5-Diabetes: Patient npo for swallow evaluation.  I will hold Glimepiride to avoid hypoglycemia due to NPO status and renal failure. SSI.  6-Dementia: Mild. patient is currently able to participate in conversation. 7- Hyperlipidemia: Continue with his statin.      LOS: 1 day   Kemyra August M.D.  [facility] 07/16/2011, 8:38 AM

## 2011-07-16 NOTE — Progress Notes (Signed)
Telephoned pt spouse at home x 2 with answer. Notified spouse, Janelle Floor, of pt fall, no injury noted, the plan to move pt closer to the nurses station and continued monitoring. Pt spouse thankful for notification. Dondra Spry

## 2011-07-16 NOTE — Progress Notes (Signed)
Pt found sitting in the floor in front of the recliner by staff at 1805. Pt reports he was attempting to place his urinal back on the bedside table after using. Pt states he fell on the way. Pt denies c/o pain on assessment and is a&o per baseline. Pt verbalizes that he bumped his head when he fell. Pt is neither externally or internally rotated to either extremity. Pt assisted back to bed via lift and staff assist. V/s remain stable. MD, Dr. Sunnie Nielsen , text paged with call back. Attempt to reach spouse at home number x 1 with no answer will attempt again. Dondra Spry

## 2011-07-16 NOTE — Progress Notes (Signed)
Stroke Team Progress Note  HISTORY Albert Nelson is an 78 y.o. male who was out with his son 07/15/2011 and was at baseline. Was able to ambulate at [facility] when he went to sit down. At supper time when he was attempting to get up to eat was unable to get up from the chair and ambulate. When he did get up seemed to be dragging the right. EMS was called at that time and patient was brought in as a code stoke. Patient was not a TPA candidate secondary to delay in arrival. He was admitted for further evaluation and treatment.  SUBJECTIVE No family is at the bedside.  Overall he feels his condition is stable. He complains of LE arthritic pain.Patient denies any focal TIA y`day and is unsure why he is here.  OBJECTIVE Most recent Vital Signs: Filed Vitals:   07/15/11 2245 07/15/11 2332 07/16/11 0044 07/16/11 0450  BP: 117/65  108/54 113/51  Pulse: 64  64 59  Temp:  98.1 F (36.7 C) 97.8 F (36.6 C) 97.7 F (36.5 C)  TempSrc:   Oral Oral  Resp: 18  17 17   Weight:   84.3 kg (185 lb 13.6 oz)   SpO2: 100%  100% 98%   CBG (last 3)   Basename 07/16/11 0741 07/16/11 0048 07/15/11 2138  GLUCAP 108* 184* 131*   Intake/Output from previous day:   IV Fluid Intake:     . sodium chloride 125 mL/hr at 07/16/11 0852  . DISCONTD: sodium chloride 100 mL/hr at 07/15/11 2239   MEDICATIONS    . aspirin EC  81 mg Oral Daily  . atorvastatin  10 mg Oral q1800  . clopidogrel  75 mg Oral Daily  . darifenacin  7.5 mg Oral Daily  . dutasteride  0.5 mg Oral Daily  . enoxaparin  40 mg Subcutaneous Q24H  . insulin aspart  0-9 Units Subcutaneous TID WC  . LORazepam  1 mg Intravenous Once  . omega-3 acid ethyl esters  1 g Oral Daily  . pantoprazole  20 mg Oral Q1200  . DISCONTD: diltiazem  180 mg Oral Daily  . DISCONTD: fish oil-omega-3 fatty acids  1 g Oral Daily  . DISCONTD: glimepiride  4 mg Oral QAC breakfast  . DISCONTD: irbesartan  75 mg Oral Daily  . DISCONTD: nabumetone  500 mg Oral BID   PRN:     Diet:  NPO  Activity:   Bathroom privileges with assistance DVT Prophylaxis:  Lovenox 40 mg sq daily   CLINICALLY SIGNIFICANT STUDIES Basic Metabolic Panel:  Lab 07/15/11 8413 07/15/11 2117  NA 137 134*  K 5.2* 5.1  CL 106 99  CO2 -- 25  GLUCOSE 138* 134*  BUN 42* 43*  CREATININE 2.00* 1.80*  CALCIUM -- 9.9  MG -- --  PHOS -- --   Liver Function Tests:  Lab 07/15/11 2117  AST 14  ALT 8  ALKPHOS 58  BILITOT 0.4  PROT 7.4  ALBUMIN 3.8   CBC:  Lab 07/15/11 2126 07/15/11 2117  WBC -- 10.5  NEUTROABS -- 6.9  HGB 12.9* 12.1*  HCT 38.0* 36.1*  MCV -- 93.5  PLT -- 241   Coagulation:  Lab 07/15/11 2117  LABPROT 13.7  INR 1.03   Cardiac Enzymes:  Lab 07/15/11 2117  CKTOTAL 45  CKMB 2.2  CKMBINDEX --  TROPONINI <0.30   Urinalysis: No results found for this basename: COLORURINE:2,APPERANCEUR:2,LABSPEC:2,PHURINE:2,GLUCOSEU:2,HGBUR:2,BILIRUBINUR:2,KETONESUR:2,PROTEINUR:2,UROBILINOGEN:2,NITRITE:2,LEUKOCYTESUR:2 in the last 168 hours  Lipid Panel    Component Value  Date/Time   CHOL 103 07/16/2011 0455   TRIG 122 07/16/2011 0455   HDL 25* 07/16/2011 0455   CHOLHDL 4.1 07/16/2011 0455   VLDL 24 07/16/2011 0455   LDLCALC 54 07/16/2011 0455   HgbA1C  No results found for this basename: HGBA1C   Urine Drug Screen:     Component Value Date/Time   LABOPIA NONE DETECTED 07/16/2011 0659   COCAINSCRNUR NONE DETECTED 07/16/2011 0659   LABBENZ NONE DETECTED 07/16/2011 0659   AMPHETMU NONE DETECTED 07/16/2011 0659   THCU NONE DETECTED 07/16/2011 0659   LABBARB NONE DETECTED 07/16/2011 0659    Alcohol Level: No results found for this basename: ETH:2 in the last 168 hours  CT of the brain  07/15/2011   1.  No acute intracranial pathology seen on CT. 2.  Marked prominence of the supratentorial ventricles, somewhat out of proportion to sulcal prominence.  This could reflect normal pressure hydrocephalous, or possibly simply moderately severe cortical volume loss.  Suggest clinical  correlation for associated symptoms. 3.  Scattered small vessel ischemic microangiopathy.   MRI of the brain    MRA of the brain    2D Echocardiogram    Carotid Doppler    CXR    EKG  Unknown rhythm, decreased voltage.   Therapy Recommendations PT -, OT -  Physical Exam    Elderly african [place] male not in distress.Awake alert. Afebrile. Head is nontraumatic. Neck is supple without bruit. Hearing is normal. Cardiac exam no murmur or gallop. Lungs are clear to auscultation. Distal pulses are well felt.  Neurological exam ; Awake  Alert oriented x 2. Diminished attention and recall. Follows 2 step commands. Knows the president`s name.. Normal speech and language.eye movements full without nystagmus. Face symmetric. Tongue midline. Normal strength, tone, reflexes and coordination. Normal sensation. Gait deferred.  ASSESSMENT Albert Nelson is a 78 y.o. male with a transient right hemiparesis in setting of suspect mild baseline dementia secondary to unknown etiology. Stroke workup underway. On aspirin 81 mg orally every day and clopidogrel 75 mg orally every day prior to admission. Now on aspirin 81 mg orally every day and clopidogrel 75 mg orally every day for secondary stroke prevention. Patient with no resultant stroke symptoms.  -diabetes mellitus -baseline dementia -previous stroke -CAD w/ CABG, stent   Hospital day # 1  TREATMENT/PLAN -Continue aspirin 81 mg orally every day and clopidogrel 75 mg orally every day for secondary stroke prevention. -complete stroke workup -stroke team to follow Albert Nelson, AVNP, ANP-BC, GNP-BC [facility] Pager: [phone] 07/16/2011 9:01 AM  Scribe for Dr. Delia Heady, Stroke Center Medical Director. He has personally reviewed chart, pertinent data, examined the patient and developed the plan of care. Pager:  [phone]

## 2011-07-16 NOTE — Progress Notes (Signed)
  Echocardiogram 2D Echocardiogram has been performed.  Shontavia Mickel, Real Cons 07/16/2011, 12:02 PM

## 2011-07-16 NOTE — Progress Notes (Signed)
Arrived to unit via stretcher  [phone]  Placed on tele    Oriented to self   No amily available     Pt unable to  Complete  Health  History    Inc  Of  Urine  Wearing  Depend  Brief   Sacrum  Slightly  Excoriated     . Brief  Removed  epc  Placed  Also  Condom  Cath  For  Urine  Collection  .   Left side  Weakness   From  Old  cva  Now  With  Right side  Weaknessp.  Pt  Appeared  Uncomfortable  With  Side to side  Turns   And  Stated  His  Right  Hip  Did  Not  Feel   Good.  Repositioned  For  comfort

## 2011-07-16 NOTE — Evaluation (Signed)
Physical Therapy Evaluation Patient Details Name: Albert Nelson MRN: [id number] DOB: [birth year] Today's Date: 07/16/2011 Time: 4098-1191 PT Time Calculation (min): 36 min  PT Assessment / Plan / Recommendation Clinical Impression  Pt. was admitted with suspicion for CVA due to right sided weakness.  It is felt that his symptoms have resolved.  Pt. has a baseline mild dementia but wife feels his current cognitive status is now for him.  He has a back and L hip issue which is causing severe pain and is limiting of his mobility.  He will need inpatient rehab prior to DC'ing back home under wife's care.  PT is needed to address the below problems.    PT Assessment  Patient needs continued PT services    Follow Up Recommendations  Inpatient Rehab    Barriers to Discharge Decreased caregiver support;Other (comment) (wife unable to prive care at pt's current functional level)      lEquipment Recommendations  Defer to next venue    Recommendations for Other Services Rehab consult   Frequency Min 4X/week    Precautions / Restrictions Precautions Precautions: Fall Precaution Comments: pt. 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 with pain in L hip/back per wife   Pertinent Vitals/Pain pain L hip and L lumbar spine not rated but impeded his mobility      Mobility  Bed Mobility Bed Mobility: Supine to Sit;Sit to Supine Supine to Sit: 2: Max assist;HOB elevated;With rails Sit to Supine: Not Tested (comment);Other (comment) (left up in recliner) Details for Bed Mobility Assistance: manual and verbal cues for safety and hand placement Transfers Transfers: Sit to Stand;Stand to Sit;Stand Pivot Transfers Sit to Stand: 2: Max assist;With upper extremity assist;From bed Stand to Sit: 2: Max assist;With upper extremity assist;To bed Stand Pivot Transfers: 2: Max assist Details for Transfer Assistance: pt. impulsive and unsafe in pushing RW to side with sit to stand; reaches for recliner and needed firm safety  cues  Ambulation/Gait Ambulation/Gait Assistance: Not tested (comment);Other (comment) (pt unable today) Assistive device: Rolling walker    Exercises     PT Diagnosis: Difficulty walking;Acute pain  PT Problem List: Decreased strength;Decreased activity tolerance;Decreased balance;Decreased mobility;Decreased knowledge of use of DME;Decreased safety awareness;Decreased knowledge of precautions;Pain PT Treatment Interventions: DME instruction;Gait training;Functional mobility training;Therapeutic activities;Therapeutic exercise;Balance training;Patient/family education   PT Goals Acute Rehab PT Goals PT Goal Formulation: With patient Time For Goal Achievement: 07/30/11 Potential to Achieve Goals: Good Pt will go Supine/Side to Sit: with min assist PT Goal: Supine/Side to Sit - Progress: Goal set today Pt will go Sit to Supine/Side: with min assist PT Goal: Sit to Supine/Side - Progress: Goal set today Pt will go Sit to Stand: with min assist PT Goal: Sit to Stand - Progress: Goal set today Pt will go Stand to Sit: with min assist PT Goal: Stand to Sit - Progress: Goal set today Pt will Transfer Bed to Chair/Chair to Bed: with min assist PT Transfer Goal: Bed to Chair/Chair to Bed - Progress: Goal set today Pt will Ambulate: 16 - 50 feet;with rolling walker PT Goal: Ambulate - Progress: Goal set today  Visit Information  Last PT Received On: 07/16/11 Assistance Needed: +2    Subjective Data  Subjective: does Ed still live here? Patient Stated Goal: return to walking independently wiht walker   Prior Functioning  Home Living Lives With: Spouse Available Help at Discharge: Family;Available 24 hours/day Type of Home: House Home Access: Stairs to enter [employer] of Steps: 1 Entrance Stairs-Rails: None  Home Layout: One level Bathroom Shower/Tub: Walk-in shower;Door [employer] Toilet: Handicapped height Bathroom Accessibility: Yes How Accessible: Accessible via  walker Home Adaptive Equipment: Walker - rolling;Shower chair with back Prior Function Level of Independence: Independent with assistive device(s) Able to Take Stairs?: Yes Driving: No Vocation: Retired Musician: No difficulties Dominant Hand: Right    Cognition  Overall Cognitive Status: Impaired Area of Impairment: Attention;Memory;Awareness of errors;Problem solving;Executive functioning;Safety/judgement;Other (comment) (follows simple one step commands; is impulsive) Arousal/Alertness: Awake/alert Orientation Level: Disoriented to;Place;Time;Situation Behavior During Session: Other (comment) (impulsivity ) Current Attention Level: Sustained Safety/Judgement: Decreased awareness of safety precautions;Decreased safety judgement for tasks assessed;Impulsive Awareness of Errors: Assistance required to identify errors made    Extremity/Trunk Assessment Right Upper Extremity Assessment RUE ROM/Strength/Tone:  (defer to OT) Left Upper Extremity Assessment LUE ROM/Strength/Tone:  (defer to OT) Right Lower Extremity Assessment RLE ROM/Strength/Tone: Deficits;Unable to fully assess;Due to impaired cognition RLE ROM/Strength/Tone Deficits: strength grossly 4-/5 , no abnormal tone noted RLE Sensation: WFL - Light Touch Left Lower Extremity Assessment LLE ROM/Strength/Tone: Unable to fully assess;Due to pain;Due to impaired cognition (pain limited MMT) LLE Sensation: WFL - Light Touch Trunk Assessment Trunk Assessment: Kyphotic   Balance Balance Balance Assessed: Yes Static Sitting Balance Static Sitting - Balance Support: Bilateral upper extremity supported;Feet supported Static Sitting - Level of Assistance: 4: Min assist Static Sitting - Comment/# of Minutes: 5 Dynamic Sitting Balance Dynamic Sitting - Balance Support: Feet supported;Right upper extremity supported Dynamic Sitting - Level of Assistance: 4: Min assist Dynamic Sitting Balance - Compensations:  leans heavily posteriorly when trying to don socks  End of Session PT - End of Session Equipment Utilized During Treatment: Gait belt Activity Tolerance: Patient limited by fatigue;Patient limited by pain Patient left: in chair;with call bell/phone within reach;with family/visitor present;Other (comment) (wife present and says she will be with pt while he is up) Nurse Communication: Mobility status;Precautions   Ferman Hamming 07/16/2011, 1:40 PM Acute Rehabilitation Services [phone] (pager)

## 2011-07-16 NOTE — Progress Notes (Signed)
Utilization review completed. Anastasha Ortez RN, CCM  

## 2011-07-16 NOTE — H&P (Signed)
Albert Nelson is an 78 y.o. male.   Chief Complaint: Stroke HPI: An 78 year old gentleman with history of coronary artery disease status post coronary artery bypass grafting in 2008 also prior history of CVA after his stenting who was brought in by his wife secondary to right-sided weakness. Patient was last seen normal around 4 PM but by 5:30 PM he was unable to move his right side. He no speech changes but generally weak. He was rushed to the hospital with a code stroke. He was evaluated by neurology and not a candidate for TPA. His symptoms also have improved tremendously and have resolved now. He has diabetes, hyperlipidemia and history of hypertension. He was however concerned hypotensive with systolic blood pressure dropping to the 90s.   Past Medical History  Diagnosis Date  . Diabetes mellitus   . Stroke   . Coronary artery disease     Past Surgical History  Procedure Date  . Coronary artery bypass graft   . Coronary stent placement     History reviewed. No pertinent family history. Social History:  reports that he has never smoked. He does not have any smokeless tobacco history on file. He reports that he does not drink alcohol. His drug history not on file.  Allergies:  Allergies  Allergen Reactions  . Penicillins     Medications Prior to Admission  Medication Sig Dispense Refill  . allopurinol (ZYLOPRIM) 100 MG tablet Take 100 mg by mouth daily.      Marland Kitchen aspirin EC 81 MG tablet Take 81 mg by mouth daily.      . clopidogrel (PLAVIX) 75 MG tablet Take 75 mg by mouth daily.      Marland Kitchen diltiazem (DILACOR XR) 180 MG 24 hr capsule Take 180 mg by mouth daily.      Marland Kitchen dutasteride (AVODART) 0.5 MG capsule Take 0.5 mg by mouth daily.      . fish oil-omega-3 fatty acids 1000 MG capsule Take 1 g by mouth daily.      Marland Kitchen glimepiride (AMARYL) 4 MG tablet Take 4 mg by mouth daily before breakfast.      . lansoprazole (PREVACID) 30 MG capsule Take 30 mg by mouth daily.      . Methylcellulose,  Laxative, (CITRUCEL PO) Take 2 tablets by mouth daily.      . nabumetone (RELAFEN) 500 MG tablet Take 500 mg by mouth 2 (two) times daily.      . rosuvastatin (CRESTOR) 20 MG tablet Take 20 mg by mouth daily.      . solifenacin (VESICARE) 10 MG tablet Take 10 mg by mouth daily.      . valsartan (DIOVAN) 160 MG tablet Take 160 mg by mouth daily.        Results for orders placed during the hospital encounter of 07/15/11 (from the past 48 hour(s))  PROTIME-INR     Status: Normal   Collection Time   07/15/11  9:17 PM      Component Value Range Comment   Prothrombin Time 13.7  11.6 - 15.2 (seconds)    INR 1.03  0.00 - 1.49    APTT     Status: Normal   Collection Time   07/15/11  9:17 PM      Component Value Range Comment   aPTT 27  24 - 37 (seconds)   CBC     Status: Abnormal   Collection Time   07/15/11  9:17 PM      Component Value Range Comment  WBC 10.5  4.0 - 10.5 (K/uL)    RBC 3.86 (*) 4.22 - 5.81 (MIL/uL)    Hemoglobin 12.1 (*) 13.0 - 17.0 (g/dL)    HCT 96.0 (*) 45.4 - 52.0 (%)    MCV 93.5  78.0 - 100.0 (fL)    MCH 31.3  26.0 - 34.0 (pg)    MCHC 33.5  30.0 - 36.0 (g/dL)    RDW 09.8  11.9 - 14.7 (%)    Platelets 241  150 - 400 (K/uL)   DIFFERENTIAL     Status: Abnormal   Collection Time   07/15/11  9:17 PM      Component Value Range Comment   Neutrophils Relative 66  43 - 77 (%)    Lymphocytes Relative 32  12 - 46 (%)    Monocytes Relative 1 (*) 3 - 12 (%)    Eosinophils Relative 1  0 - 5 (%)    Basophils Relative 0  0 - 1 (%)    Band Neutrophils 0  0 - 10 (%)    Metamyelocytes Relative 0      Myelocytes 0      Promyelocytes Absolute 0      Blasts 0      nRBC 0  0 (/100 WBC)    Neutro Abs 6.9  1.7 - 7.7 (K/uL)    Lymphs Abs 3.4  0.7 - 4.0 (K/uL)    Monocytes Absolute 0.1  0.1 - 1.0 (K/uL)    Eosinophils Absolute 0.1  0.0 - 0.7 (K/uL)    Basophils Absolute 0.0  0.0 - 0.1 (K/uL)    Smear Review MORPHOLOGY UNREMARKABLE     COMPREHENSIVE METABOLIC PANEL     Status: Abnormal    Collection Time   07/15/11  9:17 PM      Component Value Range Comment   Sodium 134 (*) 135 - 145 (mEq/L)    Potassium 5.1  3.5 - 5.1 (mEq/L)    Chloride 99  96 - 112 (mEq/L)    CO2 25  19 - 32 (mEq/L)    Glucose, Bld 134 (*) 70 - 99 (mg/dL)    BUN 43 (*) 6 - 23 (mg/dL)    Creatinine, Ser 8.29 (*) 0.50 - 1.35 (mg/dL)    Calcium 9.9  8.4 - 10.5 (mg/dL)    Total Protein 7.4  6.0 - 8.3 (g/dL)    Albumin 3.8  3.5 - 5.2 (g/dL)    AST 14  0 - 37 (U/L)    ALT 8  0 - 53 (U/L)    Alkaline Phosphatase 58  39 - 117 (U/L)    Total Bilirubin 0.4  0.3 - 1.2 (mg/dL)    GFR calc non Af Amer 33 (*) >90 (mL/min)    GFR calc Af Amer 38 (*) >90 (mL/min)   CK TOTAL AND CKMB     Status: Normal   Collection Time   07/15/11  9:17 PM      Component Value Range Comment   Total CK 45  7 - 232 (U/L)    CK, MB 2.2  0.3 - 4.0 (ng/mL)    Relative Index RELATIVE INDEX IS INVALID  0.0 - 2.5    TROPONIN I     Status: Normal   Collection Time   07/15/11  9:17 PM      Component Value Range Comment   Troponin I <0.30  <0.30 (ng/mL)   POCT I-STAT, CHEM 8     Status: Abnormal   Collection Time  07/15/11  9:26 PM      Component Value Range Comment   Sodium 137  135 - 145 (mEq/L)    Potassium 5.2 (*) 3.5 - 5.1 (mEq/L)    Chloride 106  96 - 112 (mEq/L)    BUN 42 (*) 6 - 23 (mg/dL)    Creatinine, Ser 1.61 (*) 0.50 - 1.35 (mg/dL)    Glucose, Bld 096 (*) 70 - 99 (mg/dL)    Calcium, Ion 0.45  1.12 - 1.32 (mmol/L)    TCO2 26  0 - 100 (mmol/L)    Hemoglobin 12.9 (*) 13.0 - 17.0 (g/dL)    HCT 40.9 (*) 81.1 - 52.0 (%)   GLUCOSE, CAPILLARY     Status: Abnormal   Collection Time   07/15/11  9:38 PM      Component Value Range Comment   Glucose-Capillary 131 (*) 70 - 99 (mg/dL)    Comment 1 Notify RN      Comment 2 Documented in Chart     GLUCOSE, CAPILLARY     Status: Abnormal   Collection Time   07/16/11 12:48 AM      Component Value Range Comment   Glucose-Capillary 184 (*) 70 - 99 (mg/dL)    Ct Head Wo  Contrast  07/15/2011  *RADIOLOGY REPORT*  Clinical Data: Code stroke; confusion.  CT HEAD WITHOUT CONTRAST  Technique:  Contiguous axial images were obtained from the base of the skull through the vertex without contrast.  Comparison: None.  Findings: There is no evidence of acute infarction, mass lesion, or intra- or extra-axial hemorrhage on CT.  There is marked prominence of the supratentorial ventricles.  This could reflect normal pressure hydrocephalus, or possibly simply moderately severe cortical volume loss, given prominence of underlying sulci.  The prominence of the ventricles is somewhat out of proportion to sulcal prominence.  Mild cerebellar atrophy is noted.  Scattered periventricular and subcortical white matter change likely reflects small vessel ischemic microangiopathy.  The brainstem and fourth ventricle are within normal limits.  The basal ganglia are unremarkable in appearance.  The cerebral hemispheres demonstrate grossly normal gray-white differentiation. No mass effect or midline shift is seen.  There is no evidence of fracture; visualized osseous structures are unremarkable in appearance.  The visualized portions of the orbits are within normal limits.  The paranasal sinuses and mastoid air cells are well-aerated.  No significant soft tissue abnormalities are seen.  IMPRESSION:  1.  No acute intracranial pathology seen on CT. 2.  Marked prominence of the supratentorial ventricles, somewhat out of proportion to sulcal prominence.  This could reflect normal pressure hydrocephalous, or possibly simply moderately severe cortical volume loss.  Suggest clinical correlation for associated symptoms. 3.  Scattered small vessel ischemic microangiopathy.  These results were called by telephone on 07/15/2011  at  09:27 p.m. to  Dr. Dione Booze, who verbally acknowledged these results.  Original Report Authenticated By: Tonia Ghent, M.D.    Review of Systems  HENT: Negative.   Eyes: Negative.    Respiratory: Negative.   Cardiovascular: Negative.   Gastrointestinal: Negative.   Genitourinary: Negative.   Musculoskeletal: Positive for myalgias and falls.  Skin: Negative.   Neurological: Positive for focal weakness and weakness. Negative for speech change, seizures and loss of consciousness.  Psychiatric/Behavioral: Negative.     Blood pressure 113/51, pulse 59, temperature 97.7 F (36.5 C), temperature source Oral, resp. rate 17, weight 84.3 kg (185 lb 13.6 oz), SpO2 98.00%. Physical Exam  Constitutional: He is oriented  to person, place, and time. He appears well-developed and well-nourished.  HENT:  Head: Normocephalic and atraumatic.  Right Ear: External ear normal.  Left Ear: External ear normal.  Nose: Nose normal.  Mouth/Throat: Oropharynx is clear and moist.  Eyes: Conjunctivae and EOM are normal. Pupils are equal, round, and reactive to light.  Neck: Normal range of motion. Neck supple.  Cardiovascular: Normal rate, regular rhythm, normal heart sounds and intact distal pulses.   Respiratory: Effort normal and breath sounds normal.  GI: Soft. Bowel sounds are normal.  Musculoskeletal: Normal range of motion.  Neurological: He is alert and oriented to person, place, and time. He has normal reflexes. He displays normal reflexes. No cranial nerve deficit. He exhibits normal muscle tone. Coordination abnormal.  Skin: Skin is warm and dry.  Psychiatric: He has a normal mood and affect. He exhibits abnormal remote memory.     Assessment/Plan  assessment this is an 78 year old gentleman presenting with what appears to be a TIA. Patient also has dehydration hypotension and evidence of renal insufficiency which could be acute from the dehydration. Plan #1 TIA: Patient with prior history of stroke. We will admit him for full TIA workup. Get MRI/MRA of the brain, carotid Dopplers, echocardiogram and will aspirin continue his Plavix continue statin. We'll also get a neurology  consult. We'll get PT and OT to evaluate patient. #2 dehydration: We will hydrate patient gently with saline. #3 diabetes: Continue sliding scale insulin and home therapy. #4 renal insufficiency: Probably acute on chronic we will hydrate him and check his renal function to see if it improves #5 hyperlipidemia: Continue with his statin. #6 hypotension: Blood pressure has improved now we'll continue to monitor it closely. #7 dementia: This is mild and patient is currently able to participate in conversation.  Bari Handshoe,LAWAL 07/16/2011, 5:05 AM

## 2011-07-17 DIAGNOSIS — R269 Unspecified abnormalities of gait and mobility: Secondary | ICD-10-CM

## 2011-07-17 DIAGNOSIS — G459 Transient cerebral ischemic attack, unspecified: Secondary | ICD-10-CM

## 2011-07-17 DIAGNOSIS — E119 Type 2 diabetes mellitus without complications: Secondary | ICD-10-CM

## 2011-07-17 DIAGNOSIS — E860 Dehydration: Secondary | ICD-10-CM

## 2011-07-17 DIAGNOSIS — I959 Hypotension, unspecified: Secondary | ICD-10-CM

## 2011-07-17 LAB — RENAL FUNCTION PANEL
Albumin: 3.2 g/dL — ABNORMAL LOW (ref 3.5–5.2)
Chloride: 102 mEq/L (ref 96–112)
Creatinine, Ser: 1.31 mg/dL (ref 0.50–1.35)
GFR calc non Af Amer: 48 mL/min — ABNORMAL LOW (ref >90)
Potassium: 4.4 mEq/L (ref 3.5–5.1)

## 2011-07-17 LAB — CBC
MCV: 94.2 fL (ref 78.0–100.0)
Platelets: 212 10*3/uL (ref 150–400)
RDW: 13.5 % (ref 11.5–15.5)
WBC: 8.7 10*3/uL (ref 4.0–10.5)

## 2011-07-17 LAB — GLUCOSE, CAPILLARY
Glucose-Capillary: 74 mg/dL (ref 70–99)
Glucose-Capillary: 155 mg/dL — ABNORMAL HIGH (ref 70–99)

## 2011-07-17 MED ORDER — DOCUSATE SODIUM 100 MG PO CAPS
100.0000 mg | ORAL_CAPSULE | Freq: Two times a day (BID) | ORAL | Status: DC
  Administered 2011-07-17 – 2011-07-20 (×6): 100 mg via ORAL
  Filled 2011-07-17 (×8): qty 1

## 2011-07-17 MED ORDER — DILTIAZEM HCL ER COATED BEADS 120 MG PO CP24
120.0000 mg | ORAL_CAPSULE | Freq: Every day | ORAL | Status: DC
  Administered 2011-07-18 – 2011-07-20 (×3): 120 mg via ORAL
  Filled 2011-07-17 (×3): qty 1

## 2011-07-17 MED ORDER — ROSUVASTATIN CALCIUM 10 MG PO TABS
10.0000 mg | ORAL_TABLET | Freq: Every day | ORAL | Status: DC
  Administered 2011-07-17 – 2011-07-19 (×3): 10 mg via ORAL
  Filled 2011-07-17 (×4): qty 1

## 2011-07-17 MED ORDER — SENNA 8.6 MG PO TABS
1.0000 | ORAL_TABLET | Freq: Every day | ORAL | Status: DC | PRN
  Filled 2011-07-17: qty 1

## 2011-07-17 NOTE — Progress Notes (Signed)
Occupational Therapy Evaluation Patient Details Name: Albert Nelson MRN: [id number] DOB: 28-Aug-1923 Today's Date: 07/17/2011 Time: 4098-1191 OT Time Calculation (min): 47 min  OT Assessment / Plan / Recommendation Clinical Impression  Pt is 78 yo with new onset of R sided weakness, which has apparently improved. Pt with history of mild dementia.  Pt with significant deficits with cognition, mobility and ADL. Pt will benefit from skilled OT services, secondary to deficits below, to max indep with ADL and functinal mobility for ADL to facilitate D/C to next venue of care. Wife is not able to physically assist pt and does not have further support system. Rec SNF for rehab due to these factors.    OT Assessment  Patient needs continued OT Services    Follow Up Recommendations  Skilled nursing facility    Barriers to Discharge Decreased caregiver support    Equipment Recommendations  Defer to next venue    Recommendations for Other Services    Frequency  Min 2X/week    Precautions / Restrictions Precautions Precautions: Fall Precaution Comments: pt. 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 with pain in L hip/back per wife (cognitive deficits) Restrictions Weight Bearing Restrictions: No   Pertinent Vitals/Pain Demonstrated pain in back/hip. nsg aware.    ADL  Eating/Feeding: Performed;Set up Where Assessed - Eating/Feeding: Bed level Grooming: Simulated;Set up;Supervision/safety Where Assessed - Grooming: Unsupported sitting Upper Body Bathing: Simulated;Moderate assistance Where Assessed - Upper Body Bathing: Unsupported;Sitting, bed Lower Body Bathing: Simulated;Maximal assistance Where Assessed - Lower Body Bathing: Sit to stand from bed;Supported Upper Body Dressing: Simulated;Maximal assistance Where Assessed - Upper Body Dressing: Sitting, bed;Unsupported Lower Body Dressing: Simulated;+1 Total assistance Where Assessed - Lower Body Dressing: Sit to stand from bed;Supported Toilet  Transfer: Simulated;+2 Total assistance Toilet Transfer Method: Stand pivot Toileting - Clothing Manipulation: Performed;+1 Total assistance Where Assessed - Glass blower/designer Manipulation: Standing Tub/Shower Transfer: Not assessed Equipment Used: Gait belt Ambulation Related to ADLs: Attempted but pt would not bear weight through L LE and immediately sat back onto bed.  ADL Comments: Wife states that her husband required min A before hospitalization. ADL affected by cognitive status at this time also.    OT Diagnosis: Generalized weakness;Cognitive deficits;Acute pain  OT Problem List: Decreased strength;Decreased range of motion;Decreased activity tolerance;Impaired balance (sitting and/or standing);Decreased coordination;Decreased cognition;Decreased safety awareness;Decreased knowledge of use of DME or AE;Decreased knowledge of precautions;Impaired UE functional use;Pain OT Treatment Interventions: Self-care/ADL training;Therapeutic exercise;Energy conservation;DME and/or AE instruction;Therapeutic activities;Cognitive remediation/compensation;Patient/family education;Balance training   OT Goals Acute Rehab OT Goals OT Goal Formulation: With family Time For Goal Achievement: 07/31/11 Potential to Achieve Goals: Good ADL Goals Pt Will Perform Grooming: with set-up;Sitting, chair ADL Goal: Grooming - Progress: Goal set today Pt Will Perform Upper Body Bathing: with supervision;with set-up;Sitting, chair ADL Goal: Upper Body Bathing - Progress: Goal set today Pt Will Perform Lower Body Bathing: with mod assist;Sit to stand from chair;Supported ADL Goal: Lower Body Bathing - Progress: Goal set today Pt Will Perform Upper Body Dressing: with min assist;Sitting, chair ADL Goal: Upper Body Dressing - Progress: Goal set today Pt Will Transfer to Toilet: with mod assist;Ambulation;3-in-1;with DME;with cueing (comment type and amount) ADL Goal: Toilet Transfer - Progress: Goal set today Pt  Will Perform Toileting - Clothing Manipulation: with supervision;Standing ADL Goal: Toileting - Clothing Manipulation - Progress: Goal set today Pt Will Perform Toileting - Hygiene: with supervision;Sit to stand from 3-in-1/toilet ADL Goal: Toileting - Hygiene - Progress: Goal set today  Visit Information  Last OT Received  On: 07/17/11 Assistance Needed: +2    Subjective Data    "I'm at [employer]."   Prior Functioning  Home Living Lives With: Spouse Available Help at Discharge: Family;Available 24 hours/day Type of Home: House Home Access: Stairs to enter [employer] of Steps: 1 Entrance Stairs-Rails: None Home Layout: One level Bathroom Shower/Tub: Walk-in shower;Door [employer] Toilet: Handicapped height Bathroom Accessibility: Yes How Accessible: Accessible via walker Home Adaptive Equipment: Walker - rolling;Shower chair with back Prior Function Level of Independence: Needs assistance;Independent with assistive device(s) (uses RW) Needs Assistance: Bathing;Dressing Bath: Minimal Dressing: Minimal Able to Take Stairs?: Yes Driving: No Vocation: Retired Comments: wife assists with ADL . states husband was mod I with mobility Communication Communication: No difficulties Dominant Hand: Right    Cognition  Overall Cognitive Status: Impaired Area of Impairment: Attention;Memory;Safety/judgement;Awareness of errors;Awareness of deficits;Problem solving;Executive functioning Arousal/Alertness: Awake/alert Orientation Level: Disoriented to;Place;Time;Situation Behavior During Session: [facility] for tasks performed Current Attention Level: Sustained Memory: Decreased recall of precautions Memory Deficits: poor STM. No recall of orientation after 1 min delay Safety/Judgement: Decreased awareness of safety precautions;Decreased safety judgement for tasks assessed;Decreased awareness of need for assistance Awareness of Errors: Assistance required to identify errors  made;Assistance required to correct errors made Awareness of Deficits: poor awareness. No awareness of situation Problem Solving: poor. Mod A Executive Functioning: decrased executive level skills Cognition - Other Comments: Pt states that her husband had moments of confusion but that this level of deficits is not his baseline.    Extremity/Trunk Assessment Right Upper Extremity Assessment RUE ROM/Strength/Tone: Deficits RUE ROM/Strength/Tone Deficits: AROM WFL but strength appears @ 3+/5 shoulder and 4/5 remaining RUE Sensation: WFL - Light Touch;WFL - Proprioception RUE Coordination: WFL - gross/fine motor Left Upper Extremity Assessment LUE ROM/Strength/Tone: Deficits;Due to pain LUE ROM/Strength/Tone Deficits: WFL with the exception of shoulder. ? rotator cuff disorder? LUE Sensation: WFL - Light Touch;WFL - Proprioception LUE Coordination: Deficits Trunk Assessment Trunk Assessment: Kyphotic   Mobility Bed Mobility Bed Mobility: Supine to Sit Supine to Sit: 2: Max assist;HOB elevated;With rails Sit to Supine: 2: Max assist Details for Bed Mobility Assistance: manual and verbal cues for safety and hand placement Transfers Transfers: Sit to Stand;Stand to Sit Sit to Stand: 2: Max assist;With upper extremity assist;From bed Stand to Sit: 2: Max assist;With upper extremity assist;To bed Details for Transfer Assistance: poor safety awareness   Exercise    Balance Dynamic Sitting Balance Dynamic Sitting - Balance Support: Bilateral upper extremity supported;Feet supported Dynamic Sitting - Level of Assistance: 4: Min assist  End of Session OT - End of Session Equipment Utilized During Treatment: Gait belt Activity Tolerance: Patient limited by pain;Other (comment) (limited due to cognitive impairments) Patient left: in bed;with call bell/phone within reach;with bed alarm set Nurse Communication: Mobility status;Other (comment) (hip pain)   Paidyn Mcferran,HILLARY 07/17/2011, 9:37 AM [facility], OTR/L  [phone] 07/17/2011

## 2011-07-17 NOTE — Progress Notes (Signed)
Patient ID: Albert Nelson, male   DOB: [birth year], 78 y.o.   MRN: [id number] Stroke Team Progress Note  HISTORY Albert Nelson is an 78 y.o. male who was out with his son 07/15/2011 and was at baseline. Was able to ambulate at [facility] when he went to sit down. At supper time when he was attempting to get up to eat was unable to get up from the chair and ambulate. When he did get up seemed to be dragging the right. EMS was called at that time and patient was brought in as a code stoke. Patient was not a TPA candidate secondary to delay in arrival. He was admitted for further evaluation and treatment.  SUBJECTIVE No family is at the bedside. Patient reports no new symptoms or complaints today.  OBJECTIVE Most recent Vital Signs: Filed Vitals:   07/16/11 2032 07/16/11 2100 07/17/11 0454 07/17/11 0900  BP: 111/50 116/64 113/62 129/65  Pulse: 69 72 61 72  Temp: 98 F (36.7 C) 98 F (36.7 C) 97.7 F (36.5 C) 97.9 F (36.6 C)  TempSrc: Oral Oral Oral Oral  Resp: 20 18 19 18   Weight:      SpO2: 100% 98% 97% 98%   CBG (last 3)   Basename 07/17/11 0747 07/17/11 0046 07/16/11 2337  GLUCAP 74 108* 59*   Intake/Output from previous day:05/10 0701 - 05/11 0700 In: 480 [P.O.:480] Out: 600 [Urine:600]  IV Fluid Intake:      . sodium chloride 125 mL/hr at 07/17/11 0158   MEDICATIONS     . aspirin EC  81 mg Oral Daily  . atorvastatin  10 mg Oral q1800  . clopidogrel  75 mg Oral Daily  . darifenacin  7.5 mg Oral Daily  . dutasteride  0.5 mg Oral Daily  . enoxaparin  40 mg Subcutaneous Q24H  . insulin aspart  0-9 Units Subcutaneous TID WC  . omega-3 acid ethyl esters  1 g Oral Daily  . pantoprazole  20 mg Oral Q1200   PRN:    Diet:  Carb Control  Activity:   Bathroom privileges with assistance DVT Prophylaxis:  Lovenox 40 mg sq daily   CLINICALLY SIGNIFICANT STUDIES Basic Metabolic Panel:   Lab 07/17/11 0502 07/15/11 2126 07/15/11 2117  NA 137 137 --  K 4.4 5.2* --  CL 102 106 --    CO2 24 -- 25  GLUCOSE 75 138* --  BUN 30* 42* --  CREATININE 1.31 2.00* --  CALCIUM 8.9 -- 9.9  MG -- -- --  PHOS 3.2 -- --   Liver Function Tests:   Lab 07/17/11 0502 07/15/11 2117  AST -- 14  ALT -- 8  ALKPHOS -- 58  BILITOT -- 0.4  PROT -- 7.4  ALBUMIN 3.2* 3.8   CBC:   Lab 07/17/11 0502 07/15/11 2126 07/15/11 2117  WBC 8.7 -- 10.5  NEUTROABS -- -- 6.9  HGB 10.6* 12.9* --  HCT 32.4* 38.0* --  MCV 94.2 -- 93.5  PLT 212 -- 241   Coagulation:   Lab 07/15/11 2117  LABPROT 13.7  INR 1.03   Cardiac Enzymes:   Lab 07/15/11 2117  CKTOTAL 45  CKMB 2.2  CKMBINDEX --  TROPONINI <0.30   Urinalysis: No results found for this basename: COLORURINE:2,APPERANCEUR:2,LABSPEC:2,PHURINE:2,GLUCOSEU:2,HGBUR:2,BILIRUBINUR:2,KETONESUR:2,PROTEINUR:2,UROBILINOGEN:2,NITRITE:2,LEUKOCYTESUR:2 in the last 168 hours  Lipid Panel    Component Value Date/Time   CHOL 103 07/16/2011 0455   TRIG 122 07/16/2011 0455   HDL 25* 07/16/2011 0455   CHOLHDL 4.1 07/16/2011 0455  VLDL 24 07/16/2011 0455   LDLCALC 54 07/16/2011 0455   HgbA1C  Lab Results  Component Value Date   HGBA1C 7.1* 07/16/2011   Urine Drug Screen:     Component Value Date/Time   LABOPIA NONE DETECTED 07/16/2011 0659   COCAINSCRNUR NONE DETECTED 07/16/2011 0659   LABBENZ NONE DETECTED 07/16/2011 0659   AMPHETMU NONE DETECTED 07/16/2011 0659   THCU NONE DETECTED 07/16/2011 0659   LABBARB NONE DETECTED 07/16/2011 0659    Alcohol Level: No results found for this basename: ETH:2 in the last 168 hours  CT of the brain  07/15/2011   1.  No acute intracranial pathology seen on CT. 2.  Marked prominence of the supratentorial ventricles, somewhat out of proportion to sulcal prominence.  This could reflect normal pressure hydrocephalous, or possibly simply moderately severe cortical volume loss.  Suggest clinical correlation for associated symptoms. 3.  Scattered small vessel ischemic microangiopathy.   MRI of the brain  07/16/11  IMPRESSION: 1. No acute intracranial abnormality. MRA findings are below. 2. Generalized volume loss. Small chronic infarct in the left cerebellum.  MRA of the brain  07/16/11 IMPRESSION: 1. Intermittently degraded by motion. 2. Intracranial atherosclerosis. No proximal hemodynamically significant stenosis or major branch occlusion.  2D Echocardiogram  07/16/11 - Left ventricle: The cavity size was normal. Wall thickness was normal. Systolic function was normal. The estimated ejection fraction was in the range of 55% to 60%. Doppler parameters are consistent with abnormal left ventricular relaxation (grade 1 diastolic dysfunction). - Ventricular septum: Septal motion showed paradox. - Aortic valve: Trivial regurgitation.  Carotid Doppler  Prelim: No evidence of internal carotid artery stenosis bilaterally. Bilateral external carotid arteries appear to be slightly dampened. Bilateral antegrade vertebral artery flow.  EKG  Unknown rhythm, decreased voltage.   Therapy Recommendations PT -, OT -  Physical Exam    Elderly african [place] male not in distress. Awake alert. Afebrile. Head is nontraumatic. Neck is supple without bruit. Hearing is normal. Cardiac exam no murmur or gallop. Lungs are clear to auscultation. Distal pulses are well felt.  Neurological exam ; Awake  Alert oriented x 2. Diminished attention and recall. Follows 2 step commands. Normal speech and language.eye movements full without nystagmus. Face symmetric. Tongue midline. Normal strength, tone, reflexes and coordination. Normal sensation. Gait deferred.   ASSESSMENT [name] is a 78 y.o. male with a transient right hemiparesis in setting of suspect mild baseline dementia secondary to unknown etiology. No evidence of acute stroke on MRI imaging. Symptoms may be TIA related or secondary to a non-neurologic event. On aspirin 81 mg orally every day and clopidogrel 75 mg orally every day prior to admission. Now on aspirin 81  mg orally every day and clopidogrel 75 mg orally every day for secondary stroke prevention. Patient with no resultant stroke symptoms.  -diabetes mellitus -baseline dementia -previous stroke -CAD w/ CABG, stent   Hospital day # 2  TREATMENT/PLAN -Continue aspirin 81 mg orally every day and clopidogrel 75 mg orally every day for secondary stroke prevention. -Follow up final CUS read but workup is otherwise unremarkable with no stroke on MRI and no significant vascular abnormality on MRA or CUS. TTE is also unremarkable. No further neurologic workup to recommend. -A1c is elevated at 7.1; continued glucose management is recommended -OT is recommending SNF as patient's wife is unable to care for him; consider social work eval to assist with discharge planning  Kipp Laurence, MD [facility] Pager: [phone] 07/17/2011 11:31 AM

## 2011-07-17 NOTE — Progress Notes (Signed)
Patient ID: Albert Nelson, male   DOB: Jul 07, 1923, 78 y.o.   MRN: [id number] [facility] Progress Note  07/17/2011   Subjective: Pt sitting up in chair.  He is slightly more confused than normal per wife.  Pt's wife wants CIR or SNF placement.   Objective:  Vital signs in last 24 hours: Filed Vitals:   07/16/11 2100 07/17/11 0454 07/17/11 0900 07/17/11 1345  BP: 116/64 113/62 129/65 118/62  Pulse: 72 61 72 76  Temp: 98 F (36.7 C) 97.7 F (36.5 C) 97.9 F (36.6 C) 98.5 F (36.9 C)  TempSrc: Oral Oral Oral Oral  Resp: 18 19 18 17   Weight:      SpO2: 98% 97% 98% 96%   Weight change: 1.2 kg (2 lb 10.3 oz)  Intake/Output Summary (Last 24 hours) at 07/17/11 1500 Last data filed at 07/17/11 0900  Gross per 24 hour  Intake    480 ml  Output    950 ml  Net   -470 ml   Lab Results  Component Value Date   HGBA1C 7.1* 07/16/2011   HGBA1C 7.1* 07/16/2011   Lab Results  Component Value Date   LDLCALC 54 07/16/2011   CREATININE 1.31 07/17/2011    Review of Systems As above, otherwise all reviewed and reported negative  Physical Exam General - awake, no distress, cooperative HEENT - NCAT, MMM Lungs - BBS, CTA CV - normal s1, s2 sounds Abd - soft, nondistended, no masses, nontender Ext - no C/C/E  Lab Results: Results for orders placed during the hospital encounter of 07/15/11 (from the past 24 hour(s))  GLUCOSE, CAPILLARY     Status: Abnormal   Collection Time   07/16/11  4:55 PM      Component Value Range   Glucose-Capillary 123 (*) 70 - 99 (mg/dL)  GLUCOSE, CAPILLARY     Status: Abnormal   Collection Time   07/16/11 11:37 PM      Component Value Range   Glucose-Capillary 59 (*) 70 - 99 (mg/dL)   Comment 1 Documented in Chart     Comment 2 Notify RN    GLUCOSE, CAPILLARY     Status: Abnormal   Collection Time   07/17/11 12:46 AM      Component Value Range   Glucose-Capillary 108 (*) 70 - 99 (mg/dL)   Comment 1 Documented in Chart     Comment 2 Notify RN      CBC     Status: Abnormal   Collection Time   07/17/11  5:02 AM      Component Value Range   WBC 8.7  4.0 - 10.5 (K/uL)   RBC 3.43 (*) 4.22 - 5.81 (MIL/uL)   Hemoglobin 10.6 (*) 13.0 - 17.0 (g/dL)   HCT 30.8 (*) 65.7 - 52.0 (%)   MCV 94.2  78.0 - 100.0 (fL)   MCH 30.3  26.0 - 34.0 (pg)   MCHC 32.2  30.0 - 36.0 (g/dL)   RDW 84.6  96.2 - 95.2 (%)   Platelets 212  150 - 400 (K/uL)  RENAL FUNCTION PANEL     Status: Abnormal   Collection Time   07/17/11  5:02 AM      Component Value Range   Sodium 137  135 - 145 (mEq/L)   Potassium 4.4  3.5 - 5.1 (mEq/L)   Chloride 102  96 - 112 (mEq/L)   CO2 24  19 - 32 (mEq/L)   Glucose, Bld 75  70 - 99 (mg/dL)  BUN 30 (*) 6 - 23 (mg/dL)   Creatinine, Ser 4.09  0.50 - 1.35 (mg/dL)   Calcium 8.9  8.4 - 81.1 (mg/dL)   Phosphorus 3.2  2.3 - 4.6 (mg/dL)   Albumin 3.2 (*) 3.5 - 5.2 (g/dL)   GFR calc non Af Amer 48 (*) >90 (mL/min)   GFR calc Af Amer 56 (*) >90 (mL/min)  GLUCOSE, CAPILLARY     Status: Normal   Collection Time   07/17/11  7:47 AM      Component Value Range   Glucose-Capillary 74  70 - 99 (mg/dL)  GLUCOSE, CAPILLARY     Status: Abnormal   Collection Time   07/17/11 11:51 AM      Component Value Range   Glucose-Capillary 155 (*) 70 - 99 (mg/dL)    Micro Results: No results found for this or any previous visit (from the past 240 hour(s)).  Medications:  Scheduled Meds:   . aspirin EC  81 mg Oral Daily  . atorvastatin  10 mg Oral q1800  . clopidogrel  75 mg Oral Daily  . darifenacin  7.5 mg Oral Daily  . dutasteride  0.5 mg Oral Daily  . enoxaparin  40 mg Subcutaneous Q24H  . insulin aspart  0-9 Units Subcutaneous TID WC  . omega-3 acid ethyl esters  1 g Oral Daily  . pantoprazole  20 mg Oral Q1200   Continuous Infusions:   . sodium chloride 125 mL/hr at 07/17/11 1233   PRN Meds:.  Assessment/Plan:  1-TIA - Continue with aspirin and plavix. Neuro consulted yesterday.  2-Hypotension, currently resolved - I will hold  Diovan, Diltiazem. Continue with IV fluids. SBP 100 range. No leukocytosis, no evidence of infection.  3-Acute on chronic renal failure: Improving. Continue with IV fluids.  4-Bradycardia; Held Cardizem d/t bradycardia. Currently HR 70-90. Plan to restart Cardizem at lower dose (120 mg).  5-Diabetes: Patient npo for swallow evaluation. I will hold Glimepiride to avoid hypoglycemia due to NPO status and renal failure. SSI.  6-Dementia: Mild but now with some acute delirium.  Patient is currently able to participate in conversation, not agitated but confused at times.  7- Hyperlipidemia: Continue with his statin atorvastatin 10 mg.  Disposition: Consult to CIR and / or SNF for placement.   Pt medically ready when bed available.     LOS: 2 days   Divante Kotch 07/17/2011, 3:00 PM   Cleora Fleet, MD, CDE, FAAFP [facility] [place], [place]  [phone]

## 2011-07-17 NOTE — Progress Notes (Signed)
Physical Therapy Treatment Patient Details Name: Albert Nelson MRN: [id number] DOB: 03-15-1923 Today's Date: 07/17/2011 Time: 4098-1191 PT Time Calculation (min): 23 min  PT Assessment / Plan / Recommendation Comments on Treatment Session  Pt's cognitive status and pain limiting treatment.  Pt with difficulty following commands during functional tasks.      Follow Up Recommendations  Inpatient Rehab    Barriers to Discharge        Equipment Recommendations  Defer to next venue    Recommendations for Other Services Rehab consult  Frequency Min 4X/week   Plan Discharge plan remains appropriate;Frequency remains appropriate    Precautions / Restrictions Precautions Precautions: Fall Precaution Comments: pt. 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 with pain in L hip/back per wife Restrictions Weight Bearing Restrictions: No   Pertinent Vitals/Pain C/o L LE pain with mobility.  Unable to rate.  Relieved with repositioning.    Mobility  Bed Mobility Supine to Sit: 1: +2 Total assist;With rails;HOB flat Supine to Sit: Patient Percentage: 30% Details for Bed Mobility Assistance: manual and verbal cues for all aspects of bed mobility.  Pt resistive at times secondary to c/o back pain. Transfers Transfers: Sit to Stand;Stand to Sit;Stand Pivot Transfers Sit to Stand: 1: +2 Total assist;With upper extremity assist;From bed Sit to Stand: Patient Percentage: 50% Stand to Sit: 3: Mod assist;With upper extremity assist;With armrests;To chair/3-in-1 Stand Pivot Transfers: 1: +2 Total assist Stand Pivot Transfers: Patient Percentage: 60% Details for Transfer Assistance: Poor safety awareness.  Pt attempting to sit before close enough to chair.  Poor WB thru L LE during transfer but can improve WB with cues.  At times, pt attempting NWB LLE. Ambulation/Gait Ambulation/Gait Assistance: Not tested (comment) (Pt declined secondary to states" my left leg won't hold me.")    Exercises     PT Diagnosis:      PT Problem List:   PT Treatment Interventions:     PT Goals Acute Rehab PT Goals PT Goal Formulation: With patient Time For Goal Achievement: 07/30/11 Potential to Achieve Goals: Good PT Goal: Supine/Side to Sit - Progress: Progressing toward goal PT Goal: Sit to Stand - Progress: Progressing toward goal PT Goal: Stand to Sit - Progress: Progressing toward goal PT Transfer Goal: Bed to Chair/Chair to Bed - Progress: Progressing toward goal  Visit Information  Last PT Received On: 07/17/11 Assistance Needed: +2    Subjective Data  Subjective: "Have you seen my son?"   Cognition  Overall Cognitive Status: Impaired Area of Impairment: Attention;Memory;Safety/judgement;Awareness of errors;Awareness of deficits;Problem solving;Executive functioning Arousal/Alertness: Awake/alert Orientation Level: Disoriented to;Place;Time;Situation Behavior During Session: WFL for tasks performed Current Attention Level: Sustained Memory: Decreased recall of precautions Safety/Judgement: Decreased awareness of safety precautions;Decreased safety judgement for tasks assessed;Decreased awareness of need for assistance Awareness of Errors: Assistance required to identify errors made;Assistance required to correct errors made Awareness of Deficits: poor awareness. No awareness of situation    Balance  Balance Balance Assessed: Yes Static Sitting Balance Static Sitting - Balance Support: Bilateral upper extremity supported;Feet supported Static Sitting - Level of Assistance: 5: Stand by assistance Static Sitting - Comment/# of Minutes: 5  End of Session PT - End of Session Equipment Utilized During Treatment: Gait belt Activity Tolerance: Patient limited by pain Patient left: in chair;with call bell/phone within reach    Newell Coral 07/17/2011, 1:41 PM  Newell Coral, PTA Acute Rehab [phone] (office)

## 2011-07-18 DIAGNOSIS — E119 Type 2 diabetes mellitus without complications: Secondary | ICD-10-CM

## 2011-07-18 DIAGNOSIS — R269 Unspecified abnormalities of gait and mobility: Secondary | ICD-10-CM

## 2011-07-18 DIAGNOSIS — E860 Dehydration: Secondary | ICD-10-CM

## 2011-07-18 DIAGNOSIS — G459 Transient cerebral ischemic attack, unspecified: Secondary | ICD-10-CM

## 2011-07-18 DIAGNOSIS — I959 Hypotension, unspecified: Secondary | ICD-10-CM

## 2011-07-18 LAB — GLUCOSE, CAPILLARY
Glucose-Capillary: 74 mg/dL (ref 70–99)
Glucose-Capillary: 94 mg/dL (ref 70–99)

## 2011-07-18 LAB — BASIC METABOLIC PANEL
BUN: 16 mg/dL (ref 6–23)
Chloride: 108 mEq/L (ref 96–112)
Creatinine, Ser: 1.04 mg/dL (ref 0.50–1.35)
GFR calc Af Amer: 73 mL/min — ABNORMAL LOW (ref >90)
GFR calc non Af Amer: 63 mL/min — ABNORMAL LOW (ref >90)
Glucose, Bld: 77 mg/dL (ref 70–99)

## 2011-07-18 LAB — CBC
HCT: 33.2 % — ABNORMAL LOW (ref 39.0–52.0)
Hemoglobin: 11 g/dL — ABNORMAL LOW (ref 13.0–17.0)
MCHC: 33.1 g/dL (ref 30.0–36.0)
MCV: 93.8 fL (ref 78.0–100.0)
RDW: 13.6 % (ref 11.5–15.5)

## 2011-07-18 MED ORDER — ACETAMINOPHEN 325 MG PO TABS
650.0000 mg | ORAL_TABLET | Freq: Four times a day (QID) | ORAL | Status: DC | PRN
  Administered 2011-07-18 – 2011-07-20 (×4): 650 mg via ORAL
  Filled 2011-07-18 (×4): qty 2

## 2011-07-18 NOTE — Progress Notes (Signed)
Patient ID: Albert Nelson, male   DOB: Jul 20, 1923, 78 y.o.   MRN: [id number] Stroke Team Progress Note  HISTORY Albert Nelson is an 78 y.o. male who was out with his son 07/15/2011 and was at baseline. Was able to ambulate at [facility] when he went to sit down. At supper time when he was attempting to get up to eat was unable to get up from the chair and ambulate. When he did get up seemed to be dragging the right. EMS was called at that time and patient was brought in as a code stoke. Patient was not a TPA candidate secondary to delay in arrival. He was admitted for further evaluation and treatment.  SUBJECTIVE No family is at the bedside. Patient reports no new symptoms or complaints today.  OBJECTIVE Most recent Vital Signs: Filed Vitals:   07/17/11 1345 07/17/11 1701 07/17/11 2209 07/18/11 0614  BP: 118/62 128/68 131/62 137/97  Pulse: 76 75 70 67  Temp: 98.5 F (36.9 C) 97.6 F (36.4 C) 98.8 F (37.1 C) 98.3 F (36.8 C)  TempSrc: Oral Oral Oral Oral  Resp: 17 16 19 20   Weight:      SpO2: 96% 96% 100% 100%   CBG (last 3)   Basename 07/18/11 0752 07/17/11 2152 07/17/11 1701  GLUCAP 74 122* 149*   Intake/Output from previous day:05/11 0701 - 05/12 0700 In: 240 [P.O.:240] Out: 600 [Urine:600]  IV Fluid Intake:      . sodium chloride 125 mL/hr at 07/18/11 0710   MEDICATIONS     . aspirin EC  81 mg Oral Daily  . clopidogrel  75 mg Oral Daily  . darifenacin  7.5 mg Oral Daily  . diltiazem  120 mg Oral Daily  . docusate sodium  100 mg Oral BID  . dutasteride  0.5 mg Oral Daily  . enoxaparin  40 mg Subcutaneous Q24H  . insulin aspart  0-9 Units Subcutaneous TID WC  . omega-3 acid ethyl esters  1 g Oral Daily  . pantoprazole  20 mg Oral Q1200  . rosuvastatin  10 mg Oral q1800  . DISCONTD: atorvastatin  10 mg Oral q1800   PRN:    Diet:  Carb Control  Activity:   Bathroom privileges with assistance DVT Prophylaxis:  Lovenox 40 mg sq daily   CLINICALLY SIGNIFICANT  STUDIES Basic Metabolic Panel:   Lab 07/18/11 0638 07/17/11 0502  NA 141 137  K 4.1 4.4  CL 108 102  CO2 24 24  GLUCOSE 77 75  BUN 16 30*  CREATININE 1.04 1.31  CALCIUM 8.6 8.9  MG -- --  PHOS -- 3.2   Liver Function Tests:   Lab 07/17/11 0502 07/15/11 2117  AST -- 14  ALT -- 8  ALKPHOS -- 58  BILITOT -- 0.4  PROT -- 7.4  ALBUMIN 3.2* 3.8   CBC:   Lab 07/18/11 0638 07/17/11 0502 07/15/11 2117  WBC 9.6 8.7 --  NEUTROABS -- -- 6.9  HGB 11.0* 10.6* --  HCT 33.2* 32.4* --  MCV 93.8 94.2 --  PLT 221 212 --   Coagulation:   Lab 07/15/11 2117  LABPROT 13.7  INR 1.03   Cardiac Enzymes:   Lab 07/15/11 2117  CKTOTAL 45  CKMB 2.2  CKMBINDEX --  TROPONINI <0.30   Urinalysis: No results found for this basename: COLORURINE:2,APPERANCEUR:2,LABSPEC:2,PHURINE:2,GLUCOSEU:2,HGBUR:2,BILIRUBINUR:2,KETONESUR:2,PROTEINUR:2,UROBILINOGEN:2,NITRITE:2,LEUKOCYTESUR:2 in the last 168 hours  Lipid Panel    Component Value Date/Time   CHOL 103 07/16/2011 0455   TRIG 122 07/16/2011  0455   HDL 25* 07/16/2011 0455   CHOLHDL 4.1 07/16/2011 0455   VLDL 24 07/16/2011 0455   LDLCALC 54 07/16/2011 0455   HgbA1C  Lab Results  Component Value Date   HGBA1C 7.1* 07/16/2011   Urine Drug Screen:     Component Value Date/Time   LABOPIA NONE DETECTED 07/16/2011 0659   COCAINSCRNUR NONE DETECTED 07/16/2011 0659   LABBENZ NONE DETECTED 07/16/2011 0659   AMPHETMU NONE DETECTED 07/16/2011 0659   THCU NONE DETECTED 07/16/2011 0659   LABBARB NONE DETECTED 07/16/2011 0659    Alcohol Level: No results found for this basename: ETH:2 in the last 168 hours  CT of the brain  07/15/2011   1.  No acute intracranial pathology seen on CT. 2.  Marked prominence of the supratentorial ventricles, somewhat out of proportion to sulcal prominence.  This could reflect normal pressure hydrocephalous, or possibly simply moderately severe cortical volume loss.  Suggest clinical correlation for associated symptoms. 3.   Scattered small vessel ischemic microangiopathy.   MRI of the brain  07/16/11 IMPRESSION: 1. No acute intracranial abnormality. MRA findings are below. 2. Generalized volume loss. Small chronic infarct in the left cerebellum.  MRA of the brain  07/16/11 IMPRESSION: 1. Intermittently degraded by motion. 2. Intracranial atherosclerosis. No proximal hemodynamically significant stenosis or major branch occlusion.  2D Echocardiogram  07/16/11 - Left ventricle: The cavity size was normal. Wall thickness was normal. Systolic function was normal. The estimated ejection fraction was in the range of 55% to 60%. Doppler parameters are consistent with abnormal left ventricular relaxation (grade 1 diastolic dysfunction). - Ventricular septum: Septal motion showed paradox. - Aortic valve: Trivial regurgitation.  Carotid Doppler  Prelim: No evidence of internal carotid artery stenosis bilaterally. Bilateral external carotid arteries appear to be slightly dampened. Bilateral antegrade vertebral artery flow.  EKG  Unknown rhythm, decreased voltage.   Therapy Recommendations PT -, OT -  Physical Exam    Elderly african [place] male not in distress. Awake alert. Afebrile. Head is nontraumatic. Neck is supple without bruit. Hearing is normal. Cardiac exam no murmur or gallop. Lungs are clear to auscultation. Distal pulses are well felt.  Neurological exam Awake  Alert oriented x 2. Diminished attention and recall. Follows 2 step commands. Normal speech and language.eye movements full without nystagmus. Face symmetric. Tongue midline. Normal strength, tone, reflexes and coordination. Normal sensation. Gait deferred.   ASSESSMENT [name] is a 78 y.o. male with a transient right hemiparesis in setting of suspect mild baseline dementia secondary to unknown etiology. No evidence of acute stroke on MRI imaging. Symptoms may be TIA related or secondary to a non-neurologic event. On aspirin 81 mg orally every day  and clopidogrel 75 mg orally every day prior to admission. Now on aspirin 81 mg orally every day and clopidogrel 75 mg orally every day for secondary stroke prevention. Patient with no resultant stroke symptoms.  -diabetes mellitus -baseline dementia -previous stroke -CAD w/ CABG, stent   Hospital day # 3  TREATMENT/PLAN -Continue aspirin 81 mg orally every day and clopidogrel 75 mg orally every day for secondary stroke prevention. -Follow up final CUS read but workup is otherwise unremarkable with no stroke on MRI and no significant vascular abnormality on MRA or CUS. TTE is also unremarkable. No further neurologic workup to recommend. -A1c is elevated at 7.1; continued glucose management is recommended -OT is recommending SNF as patient's wife is unable to care for him; consider social work eval to assist  with discharge planning  No change in management to recommend at this time.  Kipp Laurence, MD [facility] Pager: [phone] 07/18/2011 9:45 AM

## 2011-07-18 NOTE — Progress Notes (Signed)
[facility] Progress Note  07/18/2011   Subjective: Pt without complaints.  He is doing well eating lunch today.   Objective:  Vital signs in last 24 hours: Filed Vitals:   07/17/11 1701 07/17/11 2209 07/18/11 0614 07/18/11 1006  BP: 128/68 131/62 137/97 133/56  Pulse: 75 70 67 64  Temp: 97.6 F (36.4 C) 98.8 F (37.1 C) 98.3 F (36.8 C) 97.3 F (36.3 C)  TempSrc: Oral Oral Oral Oral  Resp: 16 19 20 18   Weight:      SpO2: 96% 100% 100% 100%   Weight change:   Intake/Output Summary (Last 24 hours) at 07/18/11 1235 Last data filed at 07/18/11 1231  Gross per 24 hour  Intake     50 ml  Output    800 ml  Net   -750 ml   Lab Results  Component Value Date   HGBA1C 7.1* 07/16/2011   HGBA1C 7.1* 07/16/2011   Lab Results  Component Value Date   LDLCALC 54 07/16/2011   CREATININE 1.04 07/18/2011    Review of Systems As above, otherwise all reviewed and reported negative  Physical Exam General - awake, no distress, cooperative HEENT - NCAT, MMM Lungs - BBS, CTA CV - normal s1, s2 sounds Abd - soft, nondistended, no masses, nontender Ext - no C/C/E  Lab Results: Results for orders placed during the hospital encounter of 07/15/11 (from the past 24 hour(s))  GLUCOSE, CAPILLARY     Status: Abnormal   Collection Time   07/17/11  5:01 PM      Component Value Range   Glucose-Capillary 149 (*) 70 - 99 (mg/dL)  GLUCOSE, CAPILLARY     Status: Abnormal   Collection Time   07/17/11  9:52 PM      Component Value Range   Glucose-Capillary 122 (*) 70 - 99 (mg/dL)  CBC     Status: Abnormal   Collection Time   07/18/11  6:38 AM      Component Value Range   WBC 9.6  4.0 - 10.5 (K/uL)   RBC 3.54 (*) 4.22 - 5.81 (MIL/uL)   Hemoglobin 11.0 (*) 13.0 - 17.0 (g/dL)   HCT 16.1 (*) 09.6 - 52.0 (%)   MCV 93.8  78.0 - 100.0 (fL)   MCH 31.1  26.0 - 34.0 (pg)   MCHC 33.1  30.0 - 36.0 (g/dL)   RDW 04.5  40.9 - 81.1 (%)   Platelets 221  150 - 400 (K/uL)  BASIC METABOLIC PANEL      Status: Abnormal   Collection Time   07/18/11  6:38 AM      Component Value Range   Sodium 141  135 - 145 (mEq/L)   Potassium 4.1  3.5 - 5.1 (mEq/L)   Chloride 108  96 - 112 (mEq/L)   CO2 24  19 - 32 (mEq/L)   Glucose, Bld 77  70 - 99 (mg/dL)   BUN 16  6 - 23 (mg/dL)   Creatinine, Ser 9.14  0.50 - 1.35 (mg/dL)   Calcium 8.6  8.4 - 78.2 (mg/dL)   GFR calc non Af Amer 63 (*) >90 (mL/min)   GFR calc Af Amer 73 (*) >90 (mL/min)  GLUCOSE, CAPILLARY     Status: Normal   Collection Time   07/18/11  7:52 AM      Component Value Range   Glucose-Capillary 74  70 - 99 (mg/dL)  GLUCOSE, CAPILLARY     Status: Normal   Collection Time   07/18/11 12:04  PM      Component Value Range   Glucose-Capillary 94  70 - 99 (mg/dL)    Micro Results: No results found for this or any previous visit (from the past 240 hour(s)).  Medications:  Scheduled Meds:   . aspirin EC  81 mg Oral Daily  . clopidogrel  75 mg Oral Daily  . darifenacin  7.5 mg Oral Daily  . diltiazem  120 mg Oral Daily  . docusate sodium  100 mg Oral BID  . dutasteride  0.5 mg Oral Daily  . enoxaparin  40 mg Subcutaneous Q24H  . insulin aspart  0-9 Units Subcutaneous TID WC  . omega-3 acid ethyl esters  1 g Oral Daily  . pantoprazole  20 mg Oral Q1200  . rosuvastatin  10 mg Oral q1800  . DISCONTD: atorvastatin  10 mg Oral q1800   Continuous Infusions:   . sodium chloride 125 mL/hr at 07/18/11 0710   PRN Meds:.acetaminophen, senna  Assessment/Plan: 1-TIA - Continue with aspirin and plavix. Neuro consulted and agree with our mgmt.  2-Hypotension, currently resolved - I will hold Diovan, Diltiazem. Continue with IV fluids. SBP 100 range. No leukocytosis, no evidence of infection.  3-Acute on chronic renal failure: Improving. Cut back on IV fluids.  4-Bradycardia; Held Cardizem d/t bradycardia. Currently HR 70-90. Restarted Cardizem at lower dose (120 mg). Pulse holding at 60s.  5-Diabetes: Patient npo for swallow evaluation. I  will hold Glimepiride to avoid hypoglycemia due to NPO status and renal failure. SSI.  6-Dementia: Mild but now with some acute delirium. Patient is currently able to participate in conversation, not agitated but confused at times.  7- Hyperlipidemia: Continue with his statin atorvastatin 10 mg.  Disposition: Consult to CIR and / or SNF for placement. Pt medically ready when bed available.    LOS: 3 days   Brisha Mccabe 07/18/2011, 12:35 PM   Cleora Fleet, MD, CDE, FAAFP [facility] [place], [place]  [phone]

## 2011-07-19 DIAGNOSIS — G459 Transient cerebral ischemic attack, unspecified: Secondary | ICD-10-CM

## 2011-07-19 DIAGNOSIS — E860 Dehydration: Secondary | ICD-10-CM

## 2011-07-19 DIAGNOSIS — R5381 Other malaise: Secondary | ICD-10-CM

## 2011-07-19 DIAGNOSIS — R269 Unspecified abnormalities of gait and mobility: Secondary | ICD-10-CM

## 2011-07-19 DIAGNOSIS — I959 Hypotension, unspecified: Secondary | ICD-10-CM

## 2011-07-19 DIAGNOSIS — E119 Type 2 diabetes mellitus without complications: Secondary | ICD-10-CM

## 2011-07-19 LAB — GLUCOSE, CAPILLARY
Glucose-Capillary: 100 mg/dL — ABNORMAL HIGH (ref 70–99)
Glucose-Capillary: 155 mg/dL — ABNORMAL HIGH (ref 70–99)

## 2011-07-19 NOTE — Progress Notes (Deleted)
CBG is 45. States he feels lightheaded and hungry. Gave snack. Will continue to monitor.

## 2011-07-19 NOTE — Clinical Social Work Psychosocial (Addendum)
    Clinical Social Work Department BRIEF PSYCHOSOCIAL ASSESSMENT 07/19/2011  Patient:  Albert Nelson, Albert Nelson     Account Number:  [id number]     Admit date:  07/15/2011  Clinical Social Worker:  Lourdes Sledge  Date/Time:  07/18/2011 03:21 PM  Referred by:  Physician  Date Referred:  07/17/2011 Referred for  SNF Placement   Other Referral:   Interview type:  Patient Other interview type:   CSW also spoke with pt family: Pt son, daughter in law and pt wife.    PSYCHOSOCIAL DATA Living Status:  WIFE Admitted from facility:   Level of care:   Primary support name:  Spouse- Mrs. Macchia Primary support relationship to patient:  SPOUSE Degree of support available:   Pt lives with spouse and has a supportive family.    CURRENT CONCERNS Current Concerns  Post-Acute Placement   Other Concerns:   CIR evaluating pt.    SOCIAL WORK ASSESSMENT / PLAN CSW received referral as pt may need SNF if not accepted into CIR. CSW spoke with pt and family who is agreeable to SNF if pt unable to go to CIR.   Assessment/plan status:  Psychosocial Support/Ongoing Assessment of Needs Other assessment/ plan:   Information/referral to community resources:   CSW Provided pt spouse with a SNF list.    PATIENT'S/FAMILY'S RESPONSE TO PLAN OF CARE: Pt laying in bed alert and appeared oriented. Pt very pleasant to speak to. CSW also spoke with pt family including spouse who were all helpful and agreeable to placement. Weekday CSW to follow up.

## 2011-07-19 NOTE — Progress Notes (Signed)
Physical Therapy Treatment Patient Details Name: Albert Nelson MRN: [id number] DOB: 1923-04-09 Today's Date: 07/19/2011 Time: 4098-1191 PT Time Calculation (min): 23 min  PT Assessment / Plan / Recommendation Comments on Treatment Session  Imporving mobility, distance ambulated; Pain still limiting mobility; Better ability to follow commands     Follow Up Recommendations  Skilled nursing facility    Barriers to Discharge        Equipment Recommendations  Defer to next venue    Recommendations for Other Services    Frequency Min 4X/week   Plan Discharge plan remains appropriate;Frequency remains appropriate    Precautions / Restrictions Precautions Precautions: Fall Precaution Comments: pt. 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 with pain in L hip/back per wife  Restrictions Weight Bearing Restrictions: No   Pertinent Vitals/Pain Pain 7-8/10 Left hip with moving/walking; Repositioned, RN made aware    Mobility  Bed Mobility Bed Mobility: Supine to Sit Supine to Sit: 3: Mod assist;With rails;HOB elevated (HOB slightly elevated) Supine to Sit: Patient Percentage: 50% Sit to Supine: 2: Max assist Details for Bed Mobility Assistance: Pt with better initiation this session, better goal directed behavior (to get OOB), and also requested "a hand" to pull up; Required heavy mod assist and use of bed pad to rotate hips to square off at EOB Transfers Transfers: Sit to Stand;Stand to Sit Sit to Stand: 3: Mod assist;With upper extremity assist;From bed Sit to Stand: Patient Percentage:  (65%) Stand to Sit: 3: Mod assist;To chair/3-in-1;With upper extremity assist Details for Transfer Assistance: Cues fro safety, hand placement; Required assist to steady RW as pt did pull up on it; Physical assist to control descent Ambulation/Gait Ambulation/Gait Assistance: 4: Min assist Ambulation Distance (Feet): 18 Feet Assistive device: Rolling walker Ambulation/Gait Assistance Details: Short step length,  especially short Right step length as stance Left is quite painful; Forward flexed posture; Cued pt in step sequence Gait Pattern: Step-to pattern;Decreased step length - right;Decreased stance time - left;Antalgic Modified Rankin (Stroke Patients Only) Modified Rankin: Moderately severe disability    Exercises     PT Diagnosis:    PT Problem List:   PT Treatment Interventions:     PT Goals Acute Rehab PT Goals Time For Goal Achievement: 07/30/11 Potential to Achieve Goals: Good Pt will go Supine/Side to Sit: with min assist PT Goal: Supine/Side to Sit - Progress: Progressing toward goal Pt will go Sit to Stand: with min assist PT Goal: Sit to Stand - Progress: Progressing toward goal Pt will go Stand to Sit: with min assist PT Goal: Stand to Sit - Progress: Progressing toward goal Pt will Ambulate: 16 - 50 feet;with rolling walker;with min assist PT Goal: Ambulate - Progress: Goal set today  Visit Information  Last PT Received On: 07/19/11 Assistance Needed: +2 (+2 helpful to push chair behind)    Subjective Data  Subjective: Thanked PT and nurse tech for helping him at end of session   Cognition  Overall Cognitive Status: Impaired Area of Impairment: Attention;Memory;Safety/judgement;Awareness of errors;Awareness of deficits;Problem solving;Executive functioning Arousal/Alertness: Awake/alert Orientation Level: Disoriented to;Place;Time;Situation Behavior During Session: WFL for tasks performed Current Attention Level: Sustained Memory: Decreased recall of precautions Memory Deficits: Poor STM, pt unable to recall orientation w/ less than 1 min delay (attempted x3). "I'm at the school" per pt. Safety/Judgement: Decreased awareness of safety precautions;Decreased safety judgement for tasks assessed;Decreased awareness of need for assistance Awareness of Errors: Assistance required to identify errors made;Assistance required to correct errors made Awareness of Deficits: poor  awareness.  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 Sitting - Balance Support: Feet supported Static Sitting - Level of Assistance: 5: Stand by assistance Static Sitting - Comment/# of Minutes: 8 Min Dynamic Sitting Balance Dynamic Sitting - Balance Support: During functional activity;Feet supported;Left upper extremity supported Dynamic Sitting - Level of Assistance: 5: Stand by assistance;4: Min assist Dynamic Sitting Balance - Compensations: Assistance during functional ADL for bathe/dress at EOB, occasionally leans forward on walker  End of Session PT - End of Session Equipment Utilized During Treatment: Gait belt Activity Tolerance: Patient limited by pain Patient left: in chair;with call bell/phone within reach Nurse Communication: Mobility status;Precautions    Van Clines [facility] [place], [place] [phone]  07/19/2011, 11:26 AM

## 2011-07-19 NOTE — Progress Notes (Signed)
[facility] Progress Note  07/19/2011   Subjective: Pt without complaints, says he feels fine.   Objective:  Vital signs in last 24 hours: Filed Vitals:   07/18/11 1731 07/19/11 0032 07/19/11 0506 07/19/11 1313  BP: 124/62 114/40 131/89 104/49  Pulse: 65 60 65 61  Temp: 98.2 F (36.8 C) 97.9 F (36.6 C) 97.5 F (36.4 C) 98.2 F (36.8 C)  TempSrc: Oral Oral Oral Oral  Resp: 17 18 18 18   Height:  5\' 8"  (1.727 m)    Weight:  85.6 kg (188 lb 11.4 oz)    SpO2: 99% 99% 100% 96%   Weight change:   Intake/Output Summary (Last 24 hours) at 07/19/11 1413 Last data filed at 07/19/11 1024  Gross per 24 hour  Intake    840 ml  Output   1150 ml  Net   -310 ml   Lab Results  Component Value Date   HGBA1C 7.1* 07/16/2011   HGBA1C 7.1* 07/16/2011   Lab Results  Component Value Date   LDLCALC 54 07/16/2011   CREATININE 1.04 07/18/2011    Review of Systems As above, otherwise all reviewed and reported negative  Physical Exam General - awake, no distress, cooperative HEENT - NCAT, MMM Lungs - BBS, CTA CV - normal s1, s2 sounds Abd - soft, nondistended, no masses, nontender Ext - no C/C/E  Lab Results: Results for orders placed during the hospital encounter of 07/15/11 (from the past 24 hour(s))  GLUCOSE, CAPILLARY     Status: Abnormal   Collection Time   07/18/11  4:50 PM      Component Value Range   Glucose-Capillary 113 (*) 70 - 99 (mg/dL)  GLUCOSE, CAPILLARY     Status: Abnormal   Collection Time   07/19/11 12:28 AM      Component Value Range   Glucose-Capillary 100 (*) 70 - 99 (mg/dL)   Comment 1 Documented in Chart     Comment 2 Notify RN    GLUCOSE, CAPILLARY     Status: Normal   Collection Time   07/19/11  7:27 AM      Component Value Range   Glucose-Capillary 91  70 - 99 (mg/dL)  GLUCOSE, CAPILLARY     Status: Normal   Collection Time   07/19/11  8:25 AM      Component Value Range   Glucose-Capillary 90  70 - 99 (mg/dL)  GLUCOSE, CAPILLARY      Status: Abnormal   Collection Time   07/19/11 11:41 AM      Component Value Range   Glucose-Capillary 155 (*) 70 - 99 (mg/dL)    Micro Results: No results found for this or any previous visit (from the past 240 hour(s)).  Medications:  Scheduled Meds:   . aspirin EC  81 mg Oral Daily  . clopidogrel  75 mg Oral Daily  . darifenacin  7.5 mg Oral Daily  . diltiazem  120 mg Oral Daily  . docusate sodium  100 mg Oral BID  . dutasteride  0.5 mg Oral Daily  . enoxaparin  40 mg Subcutaneous Q24H  . insulin aspart  0-9 Units Subcutaneous TID WC  . omega-3 acid ethyl esters  1 g Oral Daily  . pantoprazole  20 mg Oral Q1200  . rosuvastatin  10 mg Oral q1800   Continuous Infusions:   . sodium chloride 50 mL/hr at 07/19/11 0900   PRN Meds:.acetaminophen, senna  Assessment/Plan: 1-TIA - Continue with aspirin and plavix. Neuro consulted and agree  with our mgmt, will continue. 2-Hypotension, currently resolved - Holding Diovan, Diltiazem. Saline lock IV. SBP 100 range. No leukocytosis, no evidence of infection.  3-Acute on chronic renal failure: Improving.  4-Bradycardia; Held Cardizem d/t bradycardia. Currently HR 70-90. Restarted Cardizem at lower dose (120 mg). Pulse holding at 60s.  5-Diabetes: Holding Glimepiride to avoid hypoglycemia due to NPO status and renal failure. SSI.  6-Dementia: Mild. Patient is currently able to participate in conversation, not agitated but confused at times.  7- Hyperlipidemia: Continue with his statin atorvastatin 10 mg.  Disposition: Consult to SNF for placement. Pt medically ready when bed available.    LOS: 4 days   Albert Nelson 07/19/2011, 2:13 PM  Cleora Fleet, MD, CDE, FAAFP [facility] [place], [place]  [phone]

## 2011-07-19 NOTE — Progress Notes (Signed)
Patient ID: Albert Nelson, male   DOB: January 29, 1924, 78 y.o.   MRN: [id number] Stroke Team Progress Note  HISTORY Albert Nelson is an 78 y.o. male who was out with his son 07/15/2011 and was at baseline. Was able to ambulate at [facility] when he went to sit down. At supper time when he was attempting to get up to eat was unable to get up from the chair and ambulate. When he did get up seemed to be dragging the right. EMS was called at that time and patient was brought in as a code stoke. Patient was not a TPA candidate secondary to delay in arrival. He was admitted for further evaluation and treatment.  SUBJECTIVE Patient currently eating breakfast in the bed.he has no complaints.  OBJECTIVE Most recent Vital Signs: Filed Vitals:   07/18/11 1348 07/18/11 1731 07/19/11 0032 07/19/11 0506  BP: 129/60 124/62 114/40 131/89  Pulse: 61 65 60 65  Temp: 97.4 F (36.3 C) 98.2 F (36.8 C) 97.9 F (36.6 C) 97.5 F (36.4 C)  TempSrc: Oral Oral Oral Oral  Resp: 18 17 18 18   Height:   5\' 8"  (1.727 m)   Weight:   85.6 kg (188 lb 11.4 oz)   SpO2: 100% 99% 99% 100%   CBG (last 3)   Basename 07/19/11 0028 07/18/11 1650 07/18/11 1204  GLUCAP 100* 113* 94   Intake/Output from previous day:05/12 0701 - 05/13 0700 In: 1130 [P.O.:530; I.V.:600] Out: 1250 [Urine:1250]  IV Fluid Intake:     . sodium chloride 50 mL (07/18/11 2125)   MEDICATIONS    . aspirin EC  81 mg Oral Daily  . clopidogrel  75 mg Oral Daily  . darifenacin  7.5 mg Oral Daily  . diltiazem  120 mg Oral Daily  . docusate sodium  100 mg Oral BID  . dutasteride  0.5 mg Oral Daily  . enoxaparin  40 mg Subcutaneous Q24H  . insulin aspart  0-9 Units Subcutaneous TID WC  . omega-3 acid ethyl esters  1 g Oral Daily  . pantoprazole  20 mg Oral Q1200  . rosuvastatin  10 mg Oral q1800   PRN:    Diet:  Carb Control  Activity:   Bathroom privileges with assistance DVT Prophylaxis:  Lovenox 40 mg sq daily   CLINICALLY SIGNIFICANT  STUDIES Basic Metabolic Panel:   Lab 07/18/11 0638 07/17/11 0502  NA 141 137  K 4.1 4.4  CL 108 102  CO2 24 24  GLUCOSE 77 75  BUN 16 30*  CREATININE 1.04 1.31  CALCIUM 8.6 8.9  MG -- --  PHOS -- 3.2   Liver Function Tests:   Lab 07/17/11 0502 07/15/11 2117  AST -- 14  ALT -- 8  ALKPHOS -- 58  BILITOT -- 0.4  PROT -- 7.4  ALBUMIN 3.2* 3.8   CBC:   Lab 07/18/11 0638 07/17/11 0502 07/15/11 2117  WBC 9.6 8.7 --  NEUTROABS -- -- 6.9  HGB 11.0* 10.6* --  HCT 33.2* 32.4* --  MCV 93.8 94.2 --  PLT 221 212 --   Coagulation:   Lab 07/15/11 2117  LABPROT 13.7  INR 1.03   Cardiac Enzymes:   Lab 07/15/11 2117  CKTOTAL 45  CKMB 2.2  CKMBINDEX --  TROPONINI <0.30   Lipid Panel    Component Value Date/Time   CHOL 103 07/16/2011 0455   TRIG 122 07/16/2011 0455   HDL 25* 07/16/2011 0455   CHOLHDL 4.1 07/16/2011 0455  VLDL 24 07/16/2011 0455   LDLCALC 54 07/16/2011 0455   HgbA1C  Lab Results  Component Value Date   HGBA1C 7.1* 07/16/2011   Urine Drug Screen:     Component Value Date/Time   LABOPIA NONE DETECTED 07/16/2011 0659   COCAINSCRNUR NONE DETECTED 07/16/2011 0659   LABBENZ NONE DETECTED 07/16/2011 0659   AMPHETMU NONE DETECTED 07/16/2011 0659   THCU NONE DETECTED 07/16/2011 0659   LABBARB NONE DETECTED 07/16/2011 0659    Alcohol Level: No results found for this basename: ETH:2 in the last 168 hours  CT of the brain  07/15/2011   1.  No acute intracranial pathology seen on CT. 2.  Marked prominence of the supratentorial ventricles, somewhat out of proportion to sulcal prominence.  This could reflect normal pressure hydrocephalous, or possibly simply moderately severe cortical volume loss.  Suggest clinical correlation for associated symptoms. 3.  Scattered small vessel ischemic microangiopathy.   MRI of the brain  07/16/11 IMPRESSION: 1. No acute intracranial abnormality. MRA findings are below. 2. Generalized volume loss. Small chronic infarct in the left  cerebellum.  MRA of the brain  07/16/11 IMPRESSION: 1. Intermittently degraded by motion. 2. Intracranial atherosclerosis. No proximal hemodynamically significant stenosis or major branch occlusion.  2D Echocardiogram  07/16/11 - Left ventricle: The cavity size was normal. Wall thickness was normal. Systolic function was normal. The estimated ejection fraction was in the range of 55% to 60%. Doppler parameters are consistent with abnormal left ventricular relaxation (grade 1 diastolic dysfunction). - Ventricular septum: Septal motion showed paradox. - Aortic valve: Trivial regurgitation.  Carotid Doppler  Prelim: No evidence of internal carotid artery stenosis bilaterally. Bilateral external carotid arteries appear to be slightly dampened. Bilateral antegrade vertebral artery flow.  EKG  Unknown rhythm, decreased voltage.   Therapy Recommendations PT -CIR, OT -SNF  Physical Exam    Elderly african [place] male not in distress. Awake alert. Afebrile. Head is nontraumatic. Neck is supple without bruit. Hearing is normal. Cardiac exam no murmur or gallop. Lungs are clear to auscultation. Distal pulses are well felt.  Neurological exam Awake  Alert oriented x 2. Diminished attention and recall. Follows 2 step commands. Normal speech and language.eye movements full without nystagmus. Face symmetric. Tongue midline. Normal strength, tone, reflexes and coordination. Normal sensation. Gait deferred.   ASSESSMENT [name] is a 78 y.o. male with a transient right hemiparesis in setting of suspect mild baseline dementia secondary to unknown etiology. No evidence of acute stroke on MRI imaging. Symptoms may be TIA related or secondary to a non-neurologic event. On aspirin 81 mg orally every day and clopidogrel 75 mg orally every day prior to admission. Now on aspirin 81 mg orally every day and clopidogrel 75 mg orally every day for secondary stroke prevention. Patient with no resultant stroke  symptoms.  -diabetes mellitus -baseline dementia -previous stroke -CAD w/ CABG, stent   Hospital day # 4  TREATMENT/PLAN -Continue aspirin 81 mg orally every day and clopidogrel 75 mg orally every day for secondary stroke prevention. -A1c is elevated at 7.1; continued glucose management is recommended -discharge planning, ? SNF if wife cannot provide care. -Stroke Service will sign off. Follow up with neurology as needed.   Joaquin Music, ANP-BC, GNP-BC [facility] Pager: [phone] 07/19/2011 7:57 AM  Scribe for Dr. Delia Heady, Stroke Center Medical Director. He has personally reviewed chart, pertinent data, examined the patient and developed the plan of care. Pager:  [phone]

## 2011-07-19 NOTE — Consult Note (Signed)
Physical Medicine and Rehabilitation Consult Reason for Consult: TIA/hypotension Referring Phsyician: Albert Nelson is an 78 y.o. male.   HPI: 78 year old right-handed male with history of coronary artery disease and bypass grafting as well as history of CVA in the past admitted May 10 right-sided weakness. Patient in usual state of health until eating supper when he noted he could not get up out of the chair. Cranial CT scan showed no acute changes. MRI of the brain May 10 with no acute intracranial abnormalities other than small chronic infarct left cerebellum. MRA of the head without stenosis or major occlusion. Echocardiogram with ejection fraction of 60% and grade 1 diastolic dysfunction. Carotid Dopplers are pending. Neurology services consulted suspect TIA advised to continue aspirin Plavix as prior to hospital admission. Patient with bouts of confusion noted history of dementia family feels patient is close to baseline. Physical and occupational therapy ongoing with recommendations for physical medicine rehabilitation consult to consider inpatient rehabilitation services.  Review of Systems  Gastrointestinal: Positive for constipation.  Musculoskeletal: Positive for myalgias.  Neurological: Positive for weakness.  Psychiatric/Behavioral:       Dementia  All other systems reviewed and are negative.   Past Medical History  Diagnosis Date  . Diabetes mellitus   . Stroke   . Coronary artery disease    Past Surgical History  Procedure Date  . Coronary artery bypass graft   . Coronary stent placement    History reviewed. No pertinent family history. Social History:  reports that he has never smoked. He does not have any smokeless tobacco history on file. He reports that he does not drink alcohol. His drug history not on file. Allergies:  Allergies  Allergen Reactions  . Penicillins    Medications Prior to Admission  Medication Sig Dispense Refill  . allopurinol (ZYLOPRIM)  100 MG tablet Take 100 mg by mouth daily.      Marland Kitchen aspirin EC 81 MG tablet Take 81 mg by mouth daily.      . clopidogrel (PLAVIX) 75 MG tablet Take 75 mg by mouth daily.      Marland Kitchen diltiazem (DILACOR XR) 180 MG 24 hr capsule Take 180 mg by mouth daily.      Marland Kitchen dutasteride (AVODART) 0.5 MG capsule Take 0.5 mg by mouth daily.      . fish oil-omega-3 fatty acids 1000 MG capsule Take 1 g by mouth daily.      Marland Kitchen glimepiride (AMARYL) 4 MG tablet Take 4 mg by mouth daily before breakfast.      . lansoprazole (PREVACID) 30 MG capsule Take 30 mg by mouth daily.      . Methylcellulose, Laxative, (CITRUCEL PO) Take 2 tablets by mouth daily.      . nabumetone (RELAFEN) 500 MG tablet Take 500 mg by mouth 2 (two) times daily.      . rosuvastatin (CRESTOR) 20 MG tablet Take 20 mg by mouth daily.      . solifenacin (VESICARE) 10 MG tablet Take 10 mg by mouth daily.      . valsartan (DIOVAN) 160 MG tablet Take 160 mg by mouth daily.        Home: Home Living Lives With: Spouse Available Help at Discharge: Family;Available 24 hours/day Type of Home: House Home Access: Stairs to enter [employer] of Steps: 1 Entrance Stairs-Rails: None Home Layout: One level Bathroom Shower/Tub: Walk-in shower;Door [employer] Toilet: Handicapped height Bathroom Accessibility: Yes How Accessible: Accessible via walker Home Adaptive Equipment: Walker - rolling;Shower chair  with back  Functional History: Prior Function Bath: Minimal Dressing: Minimal Able to Take Stairs?: Yes Driving: No Vocation: Retired Comments: wife assists with ADL . states husband was mod I with mobility Functional Status:  Mobility: Bed Mobility Bed Mobility: Supine to Sit Supine to Sit: 1: +2 Total assist;With rails;HOB flat Supine to Sit: Patient Percentage: 30% Sit to Supine: 2: Max assist Transfers Transfers: Sit to Stand;Stand to Sit;Stand Pivot Transfers Sit to Stand: 1: +2 Total assist;With upper extremity assist;From bed Sit  to Stand: Patient Percentage: 50% Stand to Sit: 3: Mod assist;With upper extremity assist;With armrests;To chair/3-in-1 Stand Pivot Transfers: 1: +2 Total assist Stand Pivot Transfers: Patient Percentage: 60% Ambulation/Gait Ambulation/Gait Assistance: Not tested (comment) (Pt declined secondary to states" my left leg won't hold me.") Assistive device: Rolling walker    ADL: ADL Eating/Feeding: Performed;Set up Where Assessed - Eating/Feeding: Bed level Grooming: Simulated;Set up;Supervision/safety Where Assessed - Grooming: Unsupported sitting Upper Body Bathing: Simulated;Moderate assistance Where Assessed - Upper Body Bathing: Unsupported;Sitting, bed Lower Body Bathing: Simulated;Maximal assistance Where Assessed - Lower Body Bathing: Sit to stand from bed;Supported Upper Body Dressing: Simulated;Maximal assistance Where Assessed - Upper Body Dressing: Sitting, bed;Unsupported Lower Body Dressing: Simulated;+1 Total assistance Where Assessed - Lower Body Dressing: Sit to stand from bed;Supported Toilet Transfer: Simulated;+2 Total assistance Toilet Transfer Method: Stand pivot Toileting - Clothing Manipulation: Performed;+1 Total assistance Where Assessed - Glass blower/designer Manipulation: Standing Tub/Shower Transfer: Not assessed Equipment Used: Gait belt Ambulation Related to ADLs: Attempted but pt would not bear weight through L LE and immediately sat back onto bed.  ADL Comments: Wife states that her husband required min A before hospitalization. ADL affected by cognitive status at this time also.  Cognition: Cognition Arousal/Alertness: Awake/alert Orientation Level: Oriented to person;Disoriented to place;Disoriented to time;Disoriented to situation Cognition Overall Cognitive Status: Impaired Area of Impairment: Attention;Memory;Safety/judgement;Awareness of errors;Awareness of deficits;Problem solving;Executive functioning Arousal/Alertness:  Awake/alert Orientation Level: Disoriented to;Place;Time;Situation Behavior During Session: [facility] for tasks performed Current Attention Level: Sustained Memory: Decreased recall of precautions Memory Deficits: poor STM. No recall of orientation after 1 min delay Safety/Judgement: Decreased awareness of safety precautions;Decreased safety judgement for tasks assessed;Decreased awareness of need for assistance Awareness of Errors: Assistance required to identify errors made;Assistance required to correct errors made Awareness of Deficits: poor awareness. No awareness of situation Problem Solving: poor. Mod A Executive Functioning: decrased executive level skills Cognition - Other Comments: Pt states that her husband had moments of confusion but that this level of deficits is not his baseline.  Blood pressure 131/89, pulse 65, temperature 97.5 F (36.4 C), temperature source Oral, resp. rate 18, height 5\' 8"  (1.727 m), weight 85.6 kg (188 lb 11.4 oz), SpO2 100.00%. Physical Exam  Nursing note and vitals reviewed. Neck: Neck supple. No thyromegaly present.  Cardiovascular: Regular rhythm.   Pulmonary/Chest: Breath sounds normal. He has no wheezes.  Abdominal: He exhibits no distension. There is no tenderness.  Neurological: He is alert. No cranial nerve deficit or sensory deficit.       Patient appropriate for age but needs cues for situaion. Was oriented to place. He could not name the year but named the  month. He followed basic two-step commands  LUE 2-3/5 with ?pain inhibition. RUE 3-4, BLE 2+ to 4/5.      Psychiatric: Judgment and thought content normal. His affect is blunt.    Results for orders placed during the hospital encounter of 07/15/11 (from the past 24 hour(s))  CBC     Status:  Abnormal   Collection Time   07/18/11  6:38 AM      Component Value Range   WBC 9.6  4.0 - 10.5 (K/uL)   RBC 3.54 (*) 4.22 - 5.81 (MIL/uL)   Hemoglobin 11.0 (*) 13.0 - 17.0 (g/dL)   HCT 16.1 (*)  09.6 - 52.0 (%)   MCV 93.8  78.0 - 100.0 (fL)   MCH 31.1  26.0 - 34.0 (pg)   MCHC 33.1  30.0 - 36.0 (g/dL)   RDW 04.5  40.9 - 81.1 (%)   Platelets 221  150 - 400 (K/uL)  BASIC METABOLIC PANEL     Status: Abnormal   Collection Time   07/18/11  6:38 AM      Component Value Range   Sodium 141  135 - 145 (mEq/L)   Potassium 4.1  3.5 - 5.1 (mEq/L)   Chloride 108  96 - 112 (mEq/L)   CO2 24  19 - 32 (mEq/L)   Glucose, Bld 77  70 - 99 (mg/dL)   BUN 16  6 - 23 (mg/dL)   Creatinine, Ser 9.14  0.50 - 1.35 (mg/dL)   Calcium 8.6  8.4 - 78.2 (mg/dL)   GFR calc non Af Amer 63 (*) >90 (mL/min)   GFR calc Af Amer 73 (*) >90 (mL/min)  GLUCOSE, CAPILLARY     Status: Normal   Collection Time   07/18/11  7:52 AM      Component Value Range   Glucose-Capillary 74  70 - 99 (mg/dL)  GLUCOSE, CAPILLARY     Status: Normal   Collection Time   07/18/11 12:04 PM      Component Value Range   Glucose-Capillary 94  70 - 99 (mg/dL)  GLUCOSE, CAPILLARY     Status: Abnormal   Collection Time   07/18/11  4:50 PM      Component Value Range   Glucose-Capillary 113 (*) 70 - 99 (mg/dL)  GLUCOSE, CAPILLARY     Status: Abnormal   Collection Time   07/19/11 12:28 AM      Component Value Range   Glucose-Capillary 100 (*) 70 - 99 (mg/dL)   Comment 1 Documented in Chart     Comment 2 Notify RN     No results found.  Assessment/Plan: Diagnosis: Dementia, TIA 1. Does the need for close, 24 hr/day medical supervision in concert with the patient's rehab needs make it unreasonable for this patient to be served in a less intensive setting? No 2. Co-Morbidities requiring supervision/potential complications: dm,cad 3. Due to bladder management, bowel management, safety, skin/wound care, disease management, medication administration and patient education, does the patient require 24 hr/day rehab nursing? No 4. Does the patient require coordinated care of a physician, rehab nurse, PT , OT, SLP to address physical and functional  deficits in the context of the above medical diagnosis(es)? No Addressing deficits in the following areas: balance, endurance, locomotion, strength, bowel/bladder control, dressing, grooming, cognition, language and swallowing 5. Can the patient actively participate in an intensive therapy program of at least 3 hrs of therapy per day at least 5 days per week? No 6. The potential for patient to make measurable gains while on inpatient rehab is fair and poor 7. Anticipated functional outcomes upon discharge from inpatient rehab are N/A. 8. Estimated rehab length of stay to reach the above functional goals is: N/A 9. Does the patient have adequate social supports to accommodate these discharge functional goals? No and Potentially 10. Anticipated D/C setting: Home 11. Anticipated  post D/C treatments: HH therapy 12. Overall Rehab/Functional Prognosis: fair  RECOMMENDATIONS: This patient's condition is appropriate for continued rehabilitative care in the following setting: SNF Patient has agreed to participate in recommended program. Potentially Note that insurance prior authorization may be required for reimbursement for recommended care.  Comment:   Ivory Broad, MD 07/19/2011

## 2011-07-19 NOTE — Progress Notes (Signed)
Patient most appropriate for SNF rehab at this time. I have updated SW of our recommendations. Please call me at [phone] with questions.

## 2011-07-19 NOTE — Progress Notes (Addendum)
Occupational Therapy Treatment Patient Details Name: KAYNE YUHAS MRN: [id number] DOB: 23-May-1923 Today's Date: 07/19/2011 Time: 4098-1191 OT Time Calculation (min): 30 min  OT Assessment / Plan / Recommendation Comments on Treatment Session  Pt seen this am for individual OT treatment session for basic ADL's sitting at EOB. Pt confusion cont to limit ADL's and self care tasks. Overall +1 assist supine to EOB, Sit EOB for grooming w/ Min-SBA & Sit -Supine +2 to reposition in bed. Sit to stand x 2 w/ RW (min-mod A from EOB) today to attempt urinating (pt w/ foley).    Follow Up Recommendations   SNF    Barriers to Discharge   Poor safety awareness, awareness of deficits, confusion/STM deficits & level of assist needed for ADL's at this time.    Equipment Recommendations    Defer to next venue   Recommendations for Other Services  PT  Frequency   2x/week  Plan   As per POC/goals stated during initial eval   Precautions / Restrictions Precautions Precautions: Fall Precaution Comments: pt. 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 with pain in L hip/back per wife  Restrictions Weight Bearing Restrictions: No   Pertinent Vitals/Pain Facial grimace/moan w/ activity, RN made aware & to give meds. Pt repositioned, pt unable to rate.    ADL  Eating/Feeding: Simulated;Set up Where Assessed - Eating/Feeding: Bed level Grooming: Performed;Supervision/safety;Set up;Wash/dry hands;Wash/dry face Where Assessed - Grooming: Unsupported sitting Upper Body Bathing: Performed;Chest;Right arm;Left arm;Abdomen;Minimal assistance Where Assessed - Upper Body Bathing: Sitting, bed;Unsupported Lower Body Bathing: Simulated;Moderate assistance Where Assessed - Lower Body Bathing: Sit to stand from bed Upper Body Dressing: Performed;Moderate assistance Where Assessed - Upper Body Dressing: Sitting, bed;Sit to stand from bed;Unsupported Toilet Transfer: Performed;Moderate assistance Toilet Transfer Method: Stand  pivot;Other (comment) (Sit to stand from EOB (pt has foley, wanted to stand)) Ambulation Related to ADLs: Attempted, but pt requested back to bed instead of transfer to chair seconday to L hip pain. ADL Comments: Wife states that her husband required min A before hospitalization. ADL affected by cognitive status at this time also.    OT Diagnosis:    OT Problem List:   OT Treatment Interventions:     OT Goals    Visit Information  Last OT Received On: 07/19/11 Assistance Needed: +1 (+1 sit-stand, +2 reposition in bed)    Subjective Data  Subjective: I need to go to the bathroom Patient Stated Goal: Unable to state secondary to confusion   Prior Functioning    Min A ADL's and self care per wife report   Cognition  Overall Cognitive Status: Impaired Area of Impairment: Attention;Memory;Safety/judgement;Awareness of errors;Awareness of deficits;Problem solving;Executive functioning Arousal/Alertness: Awake/alert Orientation Level: Disoriented to;Place;Time;Situation Behavior During Session: WFL for tasks performed Current Attention Level: Sustained Memory Deficits: Poor STM, pt unable to recall orientation w/ less than 1 min delay (attempted x3). "I'm at the school" per pt. Awareness of Errors: Assistance required to identify errors made;Assistance required to correct errors made    Mobility Bed Mobility Bed Mobility: Supine to Sit Supine to Sit: 1: +1 Total assist;With rails;HOB flat Supine to Sit: Patient Percentage: 30% Sit to Supine: 2: Max assist Details for Bed Mobility Assistance: manual and verbal cues for all aspects of bed mobility. Pt resistive at times secondary to c/o back pain.  Transfers Transfers: Sit to Stand;Stand to Sit Sit to Stand: 4: Min assist;3: Mod assist Sit to Stand: Patient Percentage: 60% Stand to Sit: 3: Mod assist;To bed;With upper extremity assist  Exercises    Balance Static Sitting Balance Static Sitting - Balance Support: Feet  supported Static Sitting - Level of Assistance: 5: Stand by assistance Static Sitting - Comment/# of Minutes: 8 Min Dynamic Sitting Balance Dynamic Sitting - Balance Support: During functional activity;Feet supported;Left upper extremity supported Dynamic Sitting - Level of Assistance: 5: Stand by assistance;4: Min assist Dynamic Sitting Balance - Compensations: Assistance during functional ADL for bathe/dress at EOB, occasionally leans forward on walker  End of Session  Pt assisted back to bed (IV RN in room) and repositioned . Call bell in reach, bed alarm set.   Roselie Awkward Dixon 07/19/2011, 8:47 AM

## 2011-07-20 DIAGNOSIS — E860 Dehydration: Secondary | ICD-10-CM

## 2011-07-20 DIAGNOSIS — I959 Hypotension, unspecified: Secondary | ICD-10-CM

## 2011-07-20 DIAGNOSIS — E119 Type 2 diabetes mellitus without complications: Secondary | ICD-10-CM

## 2011-07-20 DIAGNOSIS — G459 Transient cerebral ischemic attack, unspecified: Secondary | ICD-10-CM

## 2011-07-20 LAB — GLUCOSE, CAPILLARY: Glucose-Capillary: 172 mg/dL — ABNORMAL HIGH (ref 70–99)

## 2011-07-20 MED ORDER — OMEGA-3-ACID ETHYL ESTERS 1 G PO CAPS: 1.0000 g | ORAL_CAPSULE | Freq: Every day | ORAL | Status: DC

## 2011-07-20 MED ORDER — ROSUVASTATIN CALCIUM 10 MG PO TABS: 10.0000 mg | ORAL_TABLET | Freq: Every day | ORAL | Status: DC

## 2011-07-20 MED ORDER — ACETAMINOPHEN 325 MG PO TABS: 650.0000 mg | ORAL_TABLET | Freq: Four times a day (QID) | ORAL | Status: AC | PRN

## 2011-07-20 MED ORDER — DSS 100 MG PO CAPS: 100.0000 mg | ORAL_CAPSULE | Freq: Two times a day (BID) | ORAL | Status: AC

## 2011-07-20 MED ORDER — SENNA 8.6 MG PO TABS: 1.0000 | ORAL_TABLET | Freq: Every day | ORAL | Status: DC | PRN

## 2011-07-20 MED ORDER — DILTIAZEM HCL ER COATED BEADS 120 MG PO CP24: 120.0000 mg | ORAL_CAPSULE | Freq: Every day | ORAL | Status: DC

## 2011-07-20 MED ORDER — SITAGLIPTIN PHOSPHATE 100 MG PO TABS: 100.0000 mg | ORAL_TABLET | Freq: Every day | ORAL | Status: DC

## 2011-07-20 NOTE — Clinical Social Work Placement (Addendum)
Clinical Social Work Department CLINICAL SOCIAL WORK PLACEMENT NOTE 07/20/2011  Patient:  Albert Nelson, Albert Nelson  Account Number:  [id number] Admit date:  07/15/2011  Clinical Social Worker:  Genelle Bal, LCSW  Date/time:  07/20/2011 10:35 AM  Clinical Social Work is seeking post-discharge placement for this patient at the following level of care:   SKILLED NURSING   (*CSW will update this form in Epic as items are completed)     Patient/family provided with [facility] Department of Clinical Social Work's list of facilities offering this level of care within the geographic area requested by the patient (or if unable, by the patient's family).  07/18/2011  Patient/family informed of their freedom to choose among providers that offer the needed level of care, that participate in Medicare, Medicaid or managed care program needed by the patient, have an available bed and are willing to accept the patient.    Patient/family informed of [facility]' ownership interest in [facility], as well as of the fact that they are under no obligation to receive care at this facility.  PASARR submitted to [employer] on 07/20/2011 PASARR number received from [employer] on   FL2 transmitted to all facilities in geographic area requested by pt/family on  07/20/2011 FL2 transmitted to all facilities within larger geographic area on   Patient informed that his/her managed care company has contracts with or will negotiate with  certain facilities, including the following:     Patient/family informed of bed offers received: 07/20/11  Patient chooses bed at [facility] Physician recommends and patient chooses bed at    Patient to be transferred to [facility] on 07/20/11   Patient to be transferred to facility by [facility]  The following physician request were entered in Epic:   Additional Comments:

## 2011-07-20 NOTE — Discharge Summary (Signed)
Physician Discharge Summary  Patient ID: Albert Nelson MRN: [id number] DOB/AGE: 1923-07-04 78 y.o.  Admit date: 07/15/2011 Discharge date: 07/20/2011  Discharge Diagnoses:  Active Problems:  DM (diabetes mellitus)  CAD (coronary artery disease)  Dehydration  Hyperlipidemia  Gout  Diastolic CHF  Hypotension - resolved  Acute Renal Failure - Resolved  TIA - resolved   Discharged Condition: good  Hospital Course: An 78 year old gentleman with history of coronary artery disease status post coronary artery bypass grafting in 2008 also prior history of CVA after his stenting who was brought in by his wife secondary to right-sided weakness. Patient was last seen normal around 4 PM but by 5:30 PM he was unable to move his right side. He no speech changes but generally weak. He was rushed to the hospital with a code stroke. He was evaluated by neurology and not a candidate for TPA. His symptoms also have improved tremendously and have resolved now. He has diabetes, hyperlipidemia and history of hypertension. He was however concerned hypotensive with systolic blood pressure dropping to the 90s.   He was taken off some of his BP meds.  He was seen by neurology.  Please see consult note.  He was asked to continue aspirin and plavix.     1-TIA - Continue with aspirin and plavix.  2-Hypotension, currently resolved - Discontinued Diovan, restarted Diltiazem at lower dose.  No leukocytosis, no evidence of infection.  3-Acute on chronic renal failure: Improved with IVFs.  4-Bradycardia;  Currently HR 70-90. Restarted Cardizem at lower dose (120 mg). Pulse holding at 60s.  5-Diabetes: Holding Glimepiride to avoid hypoglycemia due to NPO status and renal failure. Trial of Januvia 100 mg daily 6-Dementia: Mild. Patient is currently able to participate in conversation, not agitated but confused at times.  7- Hyperlipidemia: Continue with his statin atorvastatin 10 mg.   Consults: Neurology  Significant  Diagnostic Studies:  MRI HEAD WITHOUT CONTRAST  Technique: Multiplanar, multiecho pulse sequences of the brain and  surrounding structures were obtained according to standard protocol  without intravenous contrast.  Findings: Study is intermittently degraded by motion artifact  despite repeated imaging attempts.  Ventriculomegaly appears to be ex vacuo in nature.No restricted  diffusion to suggest acute infarction. No midline shift, mass  effect, evidence of mass lesion, extra-axial collection or acute  intracranial hemorrhage. Cervicomedullary junction and pituitary  are within normal limits. Major intracranial vascular flow voids  are preserved. Small chronic infarct in the left cerebellum.  Elsewhere aside from volume loss gray and white matter signal is  within normal limits for age. Negative for age visualized cervical  spine. Normal bone marrow signal.  Postoperative changes to the globes. Visualized paranasal sinuses  and mastoids are clear. Negative scalp soft tissues.  IMPRESSION:  1. No acute intracranial abnormality. MRA findings are below.  2. Generalized volume loss. Small chronic infarct in the left  cerebellum.  MRA HEAD WITHOUT CONTRAST  Technique: Angiographic images of the Circle of Willis were  obtained using MRA technique without intravenous contrast.  Findings: The antegrade flow in the posterior circulation.  Irregularity of the distal left vertebral artery with no  hemodynamically significant stenosis visible. Patent  vertebrobasilar junction. No basilar stenosis. Superior  cerebellar artery origins and PCA origins are patent and within  normal limits. Bilateral PCA branches are within normal limits.  The right posterior communicating arteries present, the left is  diminutive or absent.  Antegrade flow in both ICA siphons. Irregularity in the cavernous  segment likely  is related to atherosclerosis (series 5 image 49 on  the right). No hemodynamically  significant ICA stenosis. Carotid  termini are patent. MCA and ACA origins are patent. Motion  degrades detail of the MCA and ACA branches. No high-grade branch  stenosis or major occlusion is evident.  IMPRESSION:  1. Intermittently degraded by motion.  2. Intracranial atherosclerosis. No proximal hemodynamically  significant stenosis or major branch occlusion.  Original Report Authenticated By: Harley Hallmark, M.D.  Carotid dopplers Summary: No significant extracranial carotid artery stenosis demonstrated. Vertebrals are patent with antegrade flow. Dampened waveforms seen in the external carotid arteries bilaterally are of unclear significance.  Other specific details can be found in the table(s) above. Prepared and Electronically Authenticated by  Delia Heady  Transthoracic Echocardiography  Patient: Albert, Nelson MR #: [id number] Study Date: 07/16/2011 Gender: M Age: 34 Height: 167.6cm Weight: 84.3kg BSA: 2.79m^2 Pt. 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  ------------------------------------------------------------ History: PMH: Coronary artery disease. Risk factors: Diabetes mellitus. Dyslipidemia.  ------------------------------------------------------------ Study Conclusions  - Left ventricle: The cavity size was normal. Wall thickness was normal. Systolic function was normal. The estimated ejection fraction was in the range of 55% to 60%. Doppler parameters are consistent with abnormal left ventricular relaxation (grade 1 diastolic dysfunction). - Ventricular septum: Septal motion showed paradox. - Aortic valve: Trivial regurgitation. Transthoracic echocardiography. M-mode, complete 2D, spectral Doppler, and color Doppler.  Height: Height: 167.6cm. Height: 66in. Weight: Weight: 84.3kg. Weight: 185.5lb. Body mass index: BMI: 30kg/m^2. Body surface area: BSA: 2.53m^2. Blood pressure: 112/65. Patient status: Inpatient. Location: Bedside.  ------------------------------------------------------------  ------------------------------------------------------------ Left ventricle: The cavity size was normal. Wall thickness was normal. Systolic function was normal. The estimated ejection fraction was in the range of 55% to 60%. Images were inadequate for LV wall motion assessment. Doppler parameters are consistent with abnormal left ventricular relaxation (grade 1 diastolic dysfunction). There was no evidence of elevated ventricular filling pressure by Doppler parameters.  ------------------------------------------------------------ Aortic valve: Trileaflet; mildly thickened, mildly calcified leaflets. Mobility was not restricted. Sclerosis without stenosis. Doppler: Transvalvular velocity was within the normal range. Trivial regurgitation.  ------------------------------------------------------------ Aorta: The aorta was normal, not dilated, and non-diseased. Aortic root: The aortic root was normal in size.  ------------------------------------------------------------ Mitral valve: Structurally normal valve. Leaflet separation was normal. Mobility was not restricted. Doppler: Transvalvular velocity was within the normal range. There was no evidence for stenosis. No regurgitation.  ------------------------------------------------------------ Left atrium: The atrium was normal in size.  ------------------------------------------------------------ Right ventricle: The cavity size was normal. Wall thickness was normal. Systolic function was normal.  ------------------------------------------------------------ Ventricular septum: Septal motion showed  paradox.  ------------------------------------------------------------ Pulmonic valve: Doppler: Transvalvular velocity was within the normal range. There was no evidence for stenosis.  ------------------------------------------------------------ Tricuspid valve: Structurally normal valve. Doppler: Transvalvular velocity was within the normal range. No regurgitation.  ------------------------------------------------------------ Pulmonary artery: The main pulmonary artery was normal-sized. Systolic pressure was within the normal range.  ------------------------------------------------------------ Right atrium: The atrium was normal in size.  ------------------------------------------------------------ Pericardium: There was no pericardial effusion.  ------------------------------------------------------------ Systemic veins: Inferior vena cava: The vessel was normal in size.  ------------------------------------------------------------ Post procedure conclusions Ascending Aorta:  - The aorta was normal, not dilated, and non-diseased.  ------------------------------------------------------------  2D measurements Normal Doppler measurements Normal Left ventricle Left ventricle LVID ED, 50.7 mm 43-52 Ea, lat ann, 8.7 cm/s ------ chord, tiss DP 7 PLAX E/Ea, lat 5.0 ------  LVID ES, 36.9 mm 23-38 ann, tiss DP 6 chord, Ea, med ann, 5.1 cm/s ------ PLAX tiss DP 5 FS, chord, 27 % >29 E/Ea, med 8.6 ------ PLAX ann, tiss DP 2 LVPW, ED 10.2 mm ------ Mitral valve IVS/LVPW 1.02 <1.3 Peak E vel 44. cm/s ------ ratio, ED 4 Ventricular septum Peak A vel 78. cm/s ------ IVS, ED 10.4 mm ------ 5 Aortic valve Deceleration 310 ms 150-23 Leaflet 17 mm 15-26 time 0 separation Peak E/A 0.6 ------ Aorta ratio Root diam, 36 mm ------ Right ventricle ED Sa vel, lat 11. cm/s ------ Left atrium ann, tiss DP 5 AP dim 35 mm ------ AP dim 1.74 cm/m^2 <2.2 index Right ventricle RVID ED, 37.4  mm 19-38 PLAX  M-mode measurements Normal Aortic valve Leaflet 17 mm 15-26 separation  ------------------------------------------------------------ Prepared and Electronically Authenticated by  Croitoru, Mihai 2013-05-10T20:09:[id number]       Discharge Exam:Pt says that he feels much better.  He is sitting up in a chair, no distress, eating, drinking well.   Blood pressure 116/55, pulse 72, temperature 98.1 F (36.7 C), temperature source Oral, resp. rate 20, height 5\' 8"  (1.727 m), weight 84.9 kg (187 lb 2.7 oz), SpO2 97.00%. General - awake, no distress, cooperative  HEENT - NCAT, MMM Lungs - BBS, CTA  CV - normal s1, s2 sounds  Abd - soft, nondistended, no masses, nontender  Ext - no C/C/E  Disposition: Skilled Nursing Facility  Discharge Orders    Future Orders Please Complete By Expires   Diet - low sodium heart healthy      Comments:   Carb modified diet   Increase activity slowly        Medication List  As of 07/20/2011  1:36 PM   STOP taking these medications         diltiazem 180 MG 24 hr capsule      fish oil-omega-3 fatty acids 1000 MG capsule      glimepiride 4 MG tablet      nabumetone 500 MG tablet      valsartan 160 MG tablet         TAKE these medications         acetaminophen 325 MG tablet   Commonly known as: TYLENOL   Take 2 tablets (650 mg total) by mouth every 6 (six) hours as needed for pain or fever.      allopurinol 100 MG tablet   Commonly known as: ZYLOPRIM   Take 100 mg by mouth daily.      aspirin EC 81 MG tablet   Take 81 mg by mouth daily.      CITRUCEL PO   Take 2 tablets by mouth daily.      clopidogrel 75 MG tablet   Commonly known as: PLAVIX   Take 75 mg by mouth daily.      diltiazem 120 MG 24 hr capsule   Commonly known as: CARDIZEM CD   Take 1 capsule (120 mg total) by mouth daily.      DSS 100 MG Caps   Take 100 mg by mouth 2 (two) times daily.      dutasteride 0.5 MG capsule   Commonly known as: AVODART    Take 0.5 mg by mouth daily.      lansoprazole 30 MG capsule   Commonly known as: PREVACID   Take 30 mg by mouth daily.      omega-3 acid ethyl esters 1 G capsule   Commonly known as: LOVAZA  Take 1 capsule (1 g total) by mouth daily.      rosuvastatin 10 MG tablet   Commonly known as: CRESTOR   Take 1 tablet (10 mg total) by mouth daily at 6 PM.      senna 8.6 MG Tabs   Commonly known as: SENOKOT   Take 1 tablet (8.6 mg total) by mouth daily as needed.      solifenacin 10 MG tablet   Commonly known as: VESICARE   Take 10 mg by mouth daily.             Januvia 100 mg po daily        Follow-up Information    Follow up with Laurena Slimmer, MD in 1 week.   Contact information:   [address], [address], [address] [place] [address] [phone]         I spent 40 mins preparing discharge, writng prescriptions, arranging transfer, reviewing labs, xrays, tests.   Signed: Mekesha Solomon 07/20/2011, 1:36 PM

## 2011-07-20 NOTE — Progress Notes (Signed)
Physical Therapy Treatment Patient Details Name: Albert Nelson MRN: [id number] DOB: 08/04/1923 Today's Date: 07/20/2011 Time: 7829-5621 PT Time Calculation (min): 22 min  PT Assessment / Plan / Recommendation Comments on Treatment Session  Less distance ambulated today, though still agreeable to try    Follow Up Recommendations  Skilled nursing facility    Barriers to Discharge        Equipment Recommendations  Defer to next venue    Recommendations for Other Services    Frequency Min 3X/week (Decr frequency to 3X/wk based on dc plan)   Plan Discharge plan remains appropriate;Frequency needs to be updated    Precautions / Restrictions Precautions Precautions: Fall Precaution Comments: pt. 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 with pain in L hip/back per wife    Pertinent Vitals/Pain Pain more today than yesterday; Approaching 10/10 with amb, pt states "my hip is shot"    Mobility  Bed Mobility Bed Mobility: Supine to Sit Supine to Sit: 3: Mod assist;With rails;HOB elevated Details for Bed Mobility Assistance: Tired to use log roll technique in an effort to decr back pain with moving Transfers Transfers: Sit to Stand;Stand to Sit Sit to Stand: 3: Mod assist;With upper extremity assist;From bed Stand to Sit: 3: Mod assist;To chair/3-in-1;With upper extremity assist Details for Transfer Assistance: Continued safety cues as pt was more impuslive to sit today than yesterday; Cues fro hand placement and control Ambulation/Gait Ambulation/Gait Assistance: 4: Min assist (second person pushing chair for safety) Ambulation Distance (Feet): 14 Feet Assistive device: Rolling walker Ambulation/Gait Assistance Details: Cues for posture, and sequence; Pt at times changing his grip on RW to hold the forward bar instead of the grip handles Gait Pattern: Step-to pattern;Decreased step length - right;Decreased stance time - left;Antalgic General Gait Details: Second person is necessary for safety as pt  sits imppulsively and inexpectedly    Exercises     PT Diagnosis:    PT Problem List:   PT Treatment Interventions:     PT Goals Acute Rehab PT Goals Time For Goal Achievement: 07/30/11 Potential to Achieve Goals: Good Pt will go Supine/Side to Sit: with min assist PT Goal: Supine/Side to Sit - Progress: Progressing toward goal Pt will go Sit to Stand: with min assist PT Goal: Sit to Stand - Progress: Progressing toward goal Pt will go Stand to Sit: with min assist PT Goal: Stand to Sit - Progress: Progressing toward goal Pt will Ambulate: 16 - 50 feet;with rolling walker;with min assist PT Goal: Ambulate - Progress: Not progressing  Visit Information  Last PT Received On: 07/20/11 Assistance Needed: +2 (Safety/push chair behind)    Subjective Data  Subjective: Asking about members of his family; Stated he worked at [employer] for 30 years   Cognition  Overall Cognitive Status: Impaired Area of Impairment: Attention;Memory;Safety/judgement;Awareness of errors;Awareness of deficits;Problem solving;Executive functioning Arousal/Alertness: Awake/alert Orientation Level: Disoriented to;Place;Time;Situation Behavior During Session: WFL for tasks performed Current Attention Level: Sustained Memory: Decreased recall of precautions Safety/Judgement: Decreased awareness of safety precautions;Decreased safety judgement for tasks assessed;Decreased awareness of need for assistance    Balance     End of Session PT - End of Session Equipment Utilized During Treatment: Gait belt Activity Tolerance: Patient limited by pain Patient left: in chair;with call bell/phone within reach;with chair alarm set Nurse Communication: Mobility status;Precautions    Van Clines [facility] [place], [place] [phone]  07/20/2011, 11:42 AM

## 2011-07-20 NOTE — Discharge Instructions (Signed)
Please check FSBS 4 times per day and report To Dr. Chestine Spore Pt's primary care physician and endocrinologist.    STROKE/TIA DISCHARGE INSTRUCTIONS SMOKING Cigarette smoking nearly doubles your risk of having a stroke & is the single most alterable risk factor  If you smoke or have smoked in the last 12 months, you are advised to quit smoking for your health.  Most of the excess cardiovascular risk related to smoking disappears within a year of stopping.  Ask you doctor about anti-smoking medications   Quit Line: 1-800-QUIT NOW  Free Smoking Cessation Classes ([phone]  CHOLESTEROL Know your levels; limit fat & cholesterol in your diet  Lipid Panel  No results found for this basename: chol, trig, hdl, cholhdl, vldl, ldlcalc      Many patients benefit from treatment even if their cholesterol is at goal.  Goal: Total Cholesterol (CHOL) less than 160  Goal:  Triglycerides (TRIG) less than 150  Goal:  HDL greater than 40  Goal:  LDL (LDLCALC) less than 100   BLOOD PRESSURE American Stroke Association blood pressure target is less that 120/80 mm/Hg  Your discharge blood pressure is:  BP: 113/51 mmHg  Monitor your blood pressure  Limit your salt and alcohol intake  Many individuals will require more than one medication for high blood pressure  DIABETES (A1c is a blood sugar average for last 3 months) Goal HGBA1c is under 7% (HBGA1c is blood sugar average for last 3 months)  Diabetes: {STROKE DC DIABETES:22357}    No results found for this basename: HGBA1C     Your HGBA1c can be lowered with medications, healthy diet, and exercise.  Check your blood sugar as directed by your physician  Call your physician if you experience unexplained or low blood sugars.  PHYSICAL ACTIVITY/REHABILITATION Goal is 30 minutes at least 4 days per week    {STROKE DC ACTIVITY/REHAB:22359}  Activity decreases your risk of heart attack and stroke and makes your heart stronger.  It helps control  your weight and blood pressure; helps you relax and can improve your mood.  Participate in a regular exercise program.  Talk with your doctor about the best form of exercise for you (dancing, walking, swimming, cycling).  DIET/WEIGHT Goal is to maintain a healthy weight  Your discharge diet is: NPO *** liquids Your height is:    Your current weight is: Weight: 84.3 kg (185 lb 13.6 oz) Your Body Mass Index (BMI) is:     Following the type of diet specifically designed for you will help prevent another stroke.  Your goal weight range is:  ***  Your goal Body Mass Index (BMI) is 19-24.  Healthy food habits can help reduce 3 risk factors for stroke:  High cholesterol, hypertension, and excess weight.  RESOURCES Stroke/Support Group:  Call [phone]  they meet the 3rd Sunday of the month on the Rehab Unit at [facility], [place] ( no meetings June, July & Aug).  STROKE EDUCATION PROVIDED/REVIEWED AND GIVEN TO PATIENT Stroke warning signs and symptoms How to activate emergency medical system (call 911). Medications prescribed at discharge. Need for follow-up after discharge. Personal risk factors for stroke. Pneumonia vaccine given:   {STROKE DC YES/NO/DATE:22363} Flu vaccine given:   {STROKE DC YES/NO/DATE:22363} My questions have been answered, the writing is legible, and I understand these instructions.  I will adhere to these goals & educational materials that have been provided to me after my discharge from the hospital.   Blood Sugar Monitoring, Adult GLUCOSE METERS  FOR SELF-MONITORING OF BLOOD GLUCOSE  It is important to be able to correctly measure your blood sugar (glucose). You can use a blood glucose monitor (a small battery-operated device) to check your glucose level at any time. This allows you and your caregiver to monitor your diabetes and to determine how well your treatment plan is working. The process of monitoring your blood glucose with a glucose meter is called  self-monitoring of blood glucose (SMBG). When people with diabetes control their blood sugar, they have better health. To test for glucose with a typical glucose meter, place the disposable strip in the meter. Then place a small sample of blood on the "test strip." The test strip is coated with chemicals that combine with glucose in blood. The meter measures how much glucose is present. The meter displays the glucose level as a number. Several new models can record and store a number of test results. Some models can connect to personal computers to store test results or print them out.  Newer meters are often easier to use than older models. Some meters allow you to get blood from places other than your fingertip. Some new models have automatic timing, error codes, signals, or barcode readers to help with proper adjustment (calibration). Some meters have a large display screen or spoken instructions for people with visual impairments.  INSTRUCTIONS FOR USING GLUCOSE METERS  Wash your hands with soap and warm water, or clean the area with alcohol. Dry your hands completely.   Prick the side of your fingertip with a lancet (a sharp-pointed tool used by hand).   Hold the hand down and gently milk the finger until a small drop of blood appears. Catch the blood with the test strip.   Follow the instructions for inserting the test strip and using the SMBG meter. Most meters require the meter to be turned on and the test strip to be inserted before applying the blood sample.   Record the test result.   Read the instructions carefully for both the meter and the test strips that go with it. Meter instructions are found in the user manual. Keep this manual to help you solve any problems that may arise. Many meters use "error codes" when there is a problem with the meter, the test strip, or the blood sample on the strip. You will need the manual to understand these error codes and fix the problem.   New  devices are available such as laser lancets and meters that can test blood taken from "alternative sites" of the body, other than fingertips. However, you should use standard fingertip testing if your glucose changes rapidly. Also, use standard testing if:   You have eaten, exercised, or taken insulin in the past 2 hours.   You think your glucose is low.   You tend to not feel symptoms of low blood glucose (hypoglycemia).   You are ill or under stress.   Clean the meter as directed by the manufacturer.   Test the meter for accuracy as directed by the manufacturer.   Take your meter with you to your caregiver's office. This way, you can test your glucose in front of your caregiver to make sure you are using the meter correctly. Your caregiver can also take a sample of blood to test using a routine lab method. If values on the glucose meter are close to the lab results, you and your caregiver will see that your meter is working well and you are using good  technique. Your caregiver will advise you about what to do if the results do not match.  FREQUENCY OF TESTING  Your caregiver will tell you how often you should check your blood glucose. This will depend on your type of diabetes, your current level of diabetes control, and your types of medicines. The following are general guidelines, but your care plan may be different. Record all your readings and the time of day you took them for review with your caregiver.   Diabetes type 1.   When you are using insulin with good diabetic control (either multiple daily injections or via a pump), you should check your glucose 4 times a day.   If your diabetes is not well controlled, you may need to monitor more frequently, including before meals and 2 hours after meals, at bedtime, and occasionally between 2 a.m. and 3 a.m.   You should always check your glucose before a dose of insulin or before changing the rate on your insulin pump.   Diabetes type 2.     Guidelines for SMBG in diabetes type 2 are not as well defined.   If you are on insulin, follow the guidelines above.   If you are on medicines, but not insulin, and your glucose is not well controlled, you should test at least twice daily.   If you are not on insulin, and your diabetes is controlled with medicines or diet alone, you should test at least once daily, usually before breakfast.   A weekly profile will help your caregiver advise you on your care plan. The week before your visit, check your glucose before a meal and 2 hours after a meal at least daily. You may want to test before and after a different meal each day so you and your caregiver can tell how well controlled your blood sugars are throughout the course of a 24 hour period.   Gestational diabetes (diabetes during pregnancy).   Frequent testing is often necessary. Accurate timing is important.   If you are not on insulin, check your glucose 4 times a day. Check it before breakfast and 1 hour after the start of each meal.   If you are on insulin, check your glucose 6 times a day. Check it before each meal and 1 hour after the first bite of each meal.   General guidelines.   More frequent testing is required at the start of insulin treatment. Your caregiver will instruct you.   Test your glucose any time you suspect you have low blood sugar (hypoglycemia).   You should test more often when you change medicines, when you have unusual stress or illness, or in other unusual circumstances.  OTHER THINGS TO KNOW ABOUT GLUCOSE METERS  Measurement Range. Most glucose meters are able to read glucose levels over a broad range of values from as low as 0 to as high as 600 mg/dL. If you get an extremely high or low reading from your meter, you should first confirm it with another reading. Report very high or very low readings to your caregiver.   Whole Blood Glucose versus Plasma Glucose. Some older home glucose meters measure  glucose in your whole blood. In a lab or when using some newer home glucose meters, the glucose is measured in your plasma (one component of blood). The difference can be important. It is important for you and your caregiver to know whether your meter gives its results as "whole blood equivalent" or "plasma equivalent."   Display  of High and Low Glucose Values. Part of learning how to operate a meter is understanding what the meter results mean. Know how high and low glucose concentrations are displayed on your meter.   Factors that Affect Glucose Meter Performance. The accuracy of your test results depends on many factors and varies depending on the brand and type of meter. These factors include:   Low red blood cell count (anemia).   Substances in your blood (such as uric acid, vitamin C, and others).   Environmental factors (temperature, humidity, altitude).   Name-brand versus generic test strips.   Calibration. Make sure your meter is set up properly. It is a good idea to do a calibration test with a control solution recommended by the manufacturer of your meter whenever you begin using a fresh bottle of test strips. This will help verify the accuracy of your meter.   Improperly stored, expired, or defective test strips. Keep your strips in a dry place with the lid on.   Soiled meter.   Inadequate blood sample.  NEW TECHNOLOGIES FOR GLUCOSE TESTING Alternative site testing Some glucose meters allow testing blood from alternative sites. These include the:  Upper arm.   Forearm.   Base of the thumb.   Thigh.  Sampling blood from alternative sites may be desirable. However, it may have some limitations. Blood in the fingertips show changes in glucose levels more quickly than blood in other parts of the body. This means that alternative site test results may be different from fingertip test results, not because of the meter's ability to test accurately, but because the actual glucose  concentration can be different.  Continuous Glucose Monitoring Devices to measure your blood glucose continuously are available, and others are in development. These methods can be more expensive than self-monitoring with a glucose meter. However, it is uncertain how effective and reliable these devices are. Your caregiver will advise you if this approach makes sense for you. IF BLOOD SUGARS ARE CONTROLLED, PEOPLE WITH DIABETES REMAIN HEALTHIER.  SMBG is an important part of the treatment plan of patients with diabetes mellitus. Below are reasons for using SMBG:   It confirms that your glucose is at a specific, healthy level.   It detects hypoglycemia and severe hyperglycemia.   It allows you and your caregiver to make adjustments in response to changes in lifestyle for individuals requiring medicine.   It determines the need for starting insulin therapy in temporary diabetes that happens during pregnancy (gestational diabetes).  Document Released: 02/25/2003 Document Revised: 02/11/2011 Document Reviewed: 06/18/2010 [facility] Patient Information 2012 [place], [place].  Hypertension As your heart beats, it forces blood through your arteries. This force is your blood pressure. If the pressure is too high, it is called hypertension (HTN) or high blood pressure. HTN is dangerous because you may have it and not know it. High blood pressure may mean that your heart has to work harder to pump blood. Your arteries may be narrow or stiff. The extra work puts you at risk for heart disease, stroke, and other problems.  Blood pressure consists of two numbers, a higher number over a lower, 110/72, for example. It is stated as "110 over 72." The ideal is below 120 for the top number (systolic) and under 80 for the bottom (diastolic). Write down your blood pressure today. You should pay close attention to your blood pressure if you have certain conditions such as:  Heart failure.   Prior heart attack.    Diabetes  Chronic kidney disease.   Prior stroke.   Multiple risk factors for heart disease.  To see if you have HTN, your blood pressure should be measured while you are seated with your arm held at the level of the heart. It should be measured at least twice. A one-time elevated blood pressure reading (especially in the Emergency Department) does not mean that you need treatment. There may be conditions in which the blood pressure is different between your right and left arms. It is important to see your caregiver soon for a recheck. Most people have essential hypertension which means that there is not a specific cause. This type of high blood pressure may be lowered by changing lifestyle factors such as:  Stress.   Smoking.   Lack of exercise.   Excessive weight.   Drug/tobacco/alcohol use.   Eating less salt.  Most people do not have symptoms from high blood pressure until it has caused damage to the body. Effective treatment can often prevent, delay or reduce that damage. TREATMENT  When a cause has been identified, treatment for high blood pressure is directed at the cause. There are a large number of medications to treat HTN. These fall into several categories, and your caregiver will help you select the medicines that are best for you. Medications may have side effects. You should review side effects with your caregiver. If your blood pressure stays high after you have made lifestyle changes or started on medicines,   Your medication(s) may need to be changed.   Other problems may need to be addressed.   Be certain you understand your prescriptions, and know how and when to take your medicine.   Be sure to follow up with your caregiver within the time frame advised (usually within two weeks) to have your blood pressure rechecked and to review your medications.   If you are taking more than one medicine to lower your blood pressure, make sure you know how and at what  times they should be taken. Taking two medicines at the same time can result in blood pressure that is too low.  SEEK IMMEDIATE MEDICAL CARE IF:  You develop a severe headache, blurred or changing vision, or confusion.   You have unusual weakness or numbness, or a faint feeling.   You have severe chest or abdominal pain, vomiting, or breathing problems.  MAKE SURE YOU:   Understand these instructions.   Will watch your condition.   Will get help right away if you are not doing well or get worse.  Document Released: 02/22/2005 Document Revised: 02/11/2011 Document Reviewed: 10/13/2007 [facility] Patient Information 2012 [place], [place].\ Diabetes Meal Planning Guide The diabetes meal planning guide is a tool to help you plan your meals and snacks. It is important for people with diabetes to manage their blood glucose (sugar) levels. Choosing the right foods and the right amounts throughout your day will help control your blood glucose. Eating right can even help you improve your blood pressure and reach or maintain a healthy weight. CARBOHYDRATE COUNTING MADE EASY When you eat carbohydrates, they turn to sugar. This raises your blood glucose level. Counting carbohydrates can help you control this level so you feel better. When you plan your meals by counting carbohydrates, you can have more flexibility in what you eat and balance your medicine with your food intake. Carbohydrate counting simply means adding up the total amount of carbohydrate grams in your meals and snacks. Try to eat about the same amount at  each meal. Foods with carbohydrates are listed below. Each portion below is 1 carbohydrate serving or 15 grams of carbohydrates. Ask your dietician how many grams of carbohydrates you should eat at each meal or snack. Grains and Starches  1 slice bread.    English muffin or hotdog/hamburger bun.    cup cold cereal (unsweetened).   ? cup cooked pasta or rice.    cup starchy  vegetables (corn, potatoes, peas, beans, winter squash).   1 tortilla (6 inches).    bagel.   1 waffle or pancake (size of a CD).    cup cooked cereal.   4 to 6 small crackers.  *Whole grain is recommended. Fruit  1 cup fresh unsweetened berries, melon, papaya, pineapple.   1 small fresh fruit.    banana or mango.    cup fruit juice (4 oz unsweetened).    cup canned fruit in natural juice or water.   2 tbs dried fruit.   12 to 15 grapes or cherries.  Milk and Yogurt  1 cup fat-free or 1% milk.   1 cup soy milk.   6 oz light yogurt with sugar-free sweetener.   6 oz low-fat soy yogurt.   6 oz plain yogurt.  Vegetables  1 cup raw or  cup cooked is counted as 0 carbohydrates or a "free" food.   If you eat 3 or more servings at 1 meal, count them as 1 carbohydrate serving.  Other Carbohydrates   oz chips or pretzels.    cup ice cream or frozen yogurt.    cup sherbet or sorbet.   2 inch square cake, no frosting.   1 tbs honey, sugar, jam, jelly, or syrup.   2 small cookies.   3 squares of graham crackers.   3 cups popcorn.   6 crackers.   1 cup broth-based soup.   Count 1 cup casserole or other mixed foods as 2 carbohydrate servings.   Foods with less than 20 calories in a serving may be counted as 0 carbohydrates or a "free" food.  You may want to purchase a book or computer software that lists the carbohydrate gram counts of different foods. In addition, the nutrition facts panel on the labels of the foods you eat are a good source of this information. The label will tell you how big the serving size is and the total number of carbohydrate grams you will be eating per serving. Divide this number by 15 to obtain the number of carbohydrate servings in a portion. Remember, 1 carbohydrate serving equals 15 grams of carbohydrate. SERVING SIZES Measuring foods and serving sizes helps you make sure you are getting the right amount of food. The list  below tells how big or small some common serving sizes are.  1 oz.........4 stacked dice.   3 oz........[place] KitchenDeck of cards.   1 tsp.......[place] KitchenTip of little finger.   1 tbs......Mar[place] KitchenMarland KitchenThumb.   2 tbs.......Marland KitchenGolf ball.    cup......Marland KitchenHalf of a fist.   1 cup.......Marland KitchenA fist.  SAMPLE DIABETES MEAL PLAN Below is a sample meal plan that includes foods from the grain and starches, dairy, vegetable, fruit, and meat groups. A dietician can individualize a meal plan to fit your calorie needs and tell you the number of servings needed from each food group. However, controlling the total amount of carbohydrates in your meal or snack is more important than making sure you include all of the food groups at every meal. You may interchange carbohydrate containing foods (dairy, starches,  and fruits). The meal plan below is an example of a 2000 calorie diet using carbohydrate counting. This meal plan has 17 carbohydrate servings. Breakfast  1 cup oatmeal (2 carb servings).    cup light yogurt (1 carb serving).   1 cup blueberries (1 carb serving).    cup almonds.  Snack  1 large apple (2 carb servings).   1 low-fat string cheese stick.  Lunch  Chicken breast salad.   1 cup spinach.    cup chopped tomatoes.   2 oz chicken breast, sliced.   2 tbs low-fat [place] dressing.   12 whole-wheat crackers (2 carb servings).   12 to 15 grapes (1 carb serving).   1 cup low-fat milk (1 carb serving).  Snack  1 cup carrots.    cup hummus (1 carb serving).  Dinner  3 oz broiled salmon.   1 cup brown rice (3 carb servings).  Snack  1  cups steamed broccoli (1 carb serving) drizzled with 1 tsp olive oil and lemon juice.   1 cup light pudding (2 carb servings).  DIABETES MEAL PLANNING WORKSHEET Your dietician can use this worksheet to help you decide how many servings of foods and what types of foods are right for you.  BREAKFAST Food Group and Servings / Carb Servings Grain/Starches  __________________________________ Dairy __________________________________________ Vegetable ______________________________________ Fruit ___________________________________________ Meat __________________________________________ Fat ____________________________________________ LUNCH Food Group and Servings / Carb Servings Grain/Starches ___________________________________ Dairy ___________________________________________ Fruit ____________________________________________ Meat ___________________________________________ Fat _____________________________________________ [place] Food Group and Servings / Carb Servings Grain/Starches ___________________________________ Dairy ___________________________________________ Fruit ____________________________________________ Meat ___________________________________________ Fat _____________________________________________ SNACKS Food Group and Servings / Carb Servings Grain/Starches ___________________________________ Dairy ___________________________________________ Vegetable _______________________________________ Fruit ____________________________________________ Meat ___________________________________________ Fat _____________________________________________ DAILY TOTALS Starches _________________________ Vegetable ________________________ Fruit ____________________________ Dairy ____________________________ Meat ____________________________ Fat ______________________________ Document Released: 11/19/2004 Document Revised: 02/11/2011 Document Reviewed: 09/30/2008 [employer] Patient Information 2012 [place], [place].  Transient Ischemic Attack A transient ischemic attack (TIA) is a "warning stroke" that causes stroke-like symptoms. Unlike a stroke, a TIA does not cause permanent damage to the brain. The symptoms of a TIA can happen very fast and do not last long. It is important to know the symptoms of a TIA and what to do. This can  help prevent a major stroke or death. CAUSES   A TIA is caused by a temporary blockage in an artery in the brain or neck (carotid artery). The blockage does not allow the brain to get the blood supply it needs and can cause different symptoms. The blockage can be caused by either:   A blood clot.   Fatty buildup (plaque) in a neck or brain artery.  SYMPTOMS  TIA symptoms are the same as a stroke but are temporary. Symptoms can include sudden:  Numbness or weakness on one side of the body. Especially to the:   Face.   Arm.   Leg.   Trouble speaking, thinking, or confusion.   Change in vision, such as trouble seeing in one or both eyes.   Dizziness, loss of balance, or difficulty walking.   Severe headache.  ANY OF THESE SYMPTOMS MAY REPRESENT A SERIOUS PROBLEM THAT IS AN EMERGENCY. Do not wait to see if the symptoms will go away. Get medical help at once. Call your local emergency services (911 in [place] IMMEDIATELY. DO NOT drive yourself to the hospital. RISK FACTORS Risk factors can increase the risk of developing a TIA. These can include.   High blood pressure (hypertension).   High cholesterol (hyperlipidemia).  Heart disease (atherosclerosis).   Smoking.   Diabetes.   Abnormal heart rhythm (atrial fibrillation).   Family history of a stroke or heart attack.   Use of oral contraceptives (especially when combined with smoking).  DIAGNOSIS   A TIA can be diagnosed based on your:   Symptoms.   History.   Risk factors.   Tests that can help diagnose the symptoms of a TIA include:   CT or MRI scan. These tests can provide detailed images of the brain.   Carotid ultrasound. This test looks to see if there are blockages in the carotid arteries of your neck.   Arteriography. A thin, small flexible tube (catheter) is inserted through a small cut (incision) in your groin. The catheter is threaded to your carotid or vertebral artery. A dye is then injected into  the catheter. The dye highlights the arteries in your brain and allows your caregiver to look for narrowing or blockages that can cause a TIA.  TREATMENT  Based on the cause of a TIA, treatment options can vary. Treatment is important to help prevent a stroke. Treatment options can include:  Medication. Such as:   Clot-busting medicine.   Anti-platelet medicine.   Blood pressure medicine.   Blood thinner medicine.   Surgery:   Carotid endarterectomy. The carotid arteries are the arteries that supply the head and neck with oxygenated blood. This surgery can help remove fatty deposits (plaque) in the carotid arteries.   Angioplasty and stenting. This surgery uses a balloon to dilate a blocked artery in the brain. A stent is a small, metal mesh tube that can help keep an artery open  HOME CARE INSTRUCTIONS   It is important to take all medicine as told by your caregiver. If the medicine has side effects that affect you negatively, tell your caregiver right away. Do not stop taking medicine unless told by your caregiver. Some medicines may need to be changed to better treat your condition.   Do not smoke. Talk to your caregiver on how to quit smoking.   Eat a diet high in fruits, vegetables and lean meat. Avoid a high fat, high salt diet. A dietician can you help you make healthy food choices.   Maintain a healthy weight. Develop an exercise plan approved by your caregiver.  SEEK IMMEDIATE MEDICAL CARE IF:   You develop weakness or numbness on one side of your body.   You have problems thinking, speaking, or feel confused.   You have vision changes.   You feel dizzy, have trouble walking, or lose your balance.   You develop a severe headache.  MAKE SURE YOU:   Understand these instructions.   Will watch your condition.   Will get help right away if you are not doing well or get worse.  Document Released: 12/02/2004 Document Revised: 02/11/2011 Document Reviewed:  04/17/2009 [facility] Patient Information 2012 [place], [place].  Return if symptoms recur, worsen or new problems develop.

## 2011-07-20 NOTE — Progress Notes (Signed)
07/20/11 called report to [facility] for snf discharge [employer] rn

## 2012-03-18 ENCOUNTER — Encounter (HOSPITAL_COMMUNITY): Payer: Self-pay | Admitting: Emergency Medicine

## 2012-03-18 ENCOUNTER — Emergency Department (HOSPITAL_COMMUNITY)
Admission: EM | Admit: 2012-03-18 | Discharge: 2012-03-18 | Disposition: A | Discharge status: Home / Self Care | Payer: Medicare Other | Attending: Emergency Medicine | Admitting: Emergency Medicine

## 2012-03-18 ENCOUNTER — Emergency Department (HOSPITAL_COMMUNITY): Payer: Medicare Other

## 2012-03-18 DIAGNOSIS — R1110 Vomiting, unspecified: Secondary | ICD-10-CM

## 2012-03-18 DIAGNOSIS — Z7901 Long term (current) use of anticoagulants: Secondary | ICD-10-CM | POA: Insufficient documentation

## 2012-03-18 DIAGNOSIS — Z7982 Long term (current) use of aspirin: Secondary | ICD-10-CM | POA: Insufficient documentation

## 2012-03-18 DIAGNOSIS — Z79899 Other long term (current) drug therapy: Secondary | ICD-10-CM | POA: Insufficient documentation

## 2012-03-18 DIAGNOSIS — Z8673 Personal history of transient ischemic attack (TIA), and cerebral infarction without residual deficits: Secondary | ICD-10-CM | POA: Insufficient documentation

## 2012-03-18 DIAGNOSIS — E119 Type 2 diabetes mellitus without complications: Secondary | ICD-10-CM | POA: Insufficient documentation

## 2012-03-18 DIAGNOSIS — I2510 Atherosclerotic heart disease of native coronary artery without angina pectoris: Secondary | ICD-10-CM | POA: Insufficient documentation

## 2012-03-18 LAB — URINALYSIS, ROUTINE W REFLEX MICROSCOPIC
Nitrite: NEGATIVE
Protein, ur: 100 mg/dL — AB
Specific Gravity, Urine: 1.02 (ref 1.005–1.030)
Urobilinogen, UA: 1 mg/dL (ref 0.0–1.0)

## 2012-03-18 LAB — BASIC METABOLIC PANEL
Calcium: 9.8 mg/dL (ref 8.4–10.5)
GFR calc non Af Amer: 53 mL/min — ABNORMAL LOW (ref >90)
Glucose, Bld: 102 mg/dL — ABNORMAL HIGH (ref 70–99)
Potassium: 4.3 mEq/L (ref 3.5–5.1)
Sodium: 141 mEq/L (ref 135–145)

## 2012-03-18 LAB — CBC WITH DIFFERENTIAL/PLATELET
Basophils Absolute: 0.1 10*3/uL (ref 0.0–0.1)
Eosinophils Absolute: 0.2 10*3/uL (ref 0.0–0.7)
Eosinophils Relative: 2 % (ref 0–5)
Lymphocytes Relative: 18 % (ref 12–46)
Lymphs Abs: 2.1 10*3/uL (ref 0.7–4.0)
MCH: 31.5 pg (ref 26.0–34.0)
MCV: 96.2 fL (ref 78.0–100.0)
Neutrophils Relative %: 69 % (ref 43–77)
Platelets: 252 10*3/uL (ref 150–400)
RBC: 3.9 MIL/uL — ABNORMAL LOW (ref 4.22–5.81)
RDW: 12.5 % (ref 11.5–15.5)
WBC: 11.4 10*3/uL — ABNORMAL HIGH (ref 4.0–10.5)

## 2012-03-18 LAB — TROPONIN I: Troponin I: <0.3 ng/mL (ref <0.30)

## 2012-03-18 LAB — URINE MICROSCOPIC-ADD ON

## 2012-03-18 MED ORDER — ONDANSETRON HCL 4 MG/2ML IJ SOLN
4.0000 mg | Freq: Once | INTRAMUSCULAR | Status: AC
  Administered 2012-03-18: 4 mg via INTRAVENOUS
  Filled 2012-03-18: qty 2

## 2012-03-18 MED ORDER — SODIUM CHLORIDE 0.9 % IV SOLN
1000.0000 mL | INTRAVENOUS | Status: DC
  Administered 2012-03-18: 1000 mL via INTRAVENOUS

## 2012-03-18 MED ORDER — ONDANSETRON HCL 4 MG PO TABS: 4.0000 mg | ORAL_TABLET | Freq: Four times a day (QID) | ORAL | Status: DC

## 2012-03-18 MED ORDER — SODIUM CHLORIDE 0.9 % IV SOLN
1000.0000 mL | Freq: Once | INTRAVENOUS | Status: AC
  Administered 2012-03-18: 1000 mL via INTRAVENOUS

## 2012-03-18 NOTE — ED Notes (Signed)
Pt discharged to home with family. NAD.  

## 2012-03-18 NOTE — ED Provider Notes (Signed)
History     CSN: [id number]  Arrival date & time 03/18/12  1205   First MD Initiated Contact with Patient 03/18/12 1305      Chief Complaint  Patient presents with  . Emesis    (Consider location/radiation/quality/duration/timing/severity/associated sxs/prior treatment) HPI  Patient's care his wife. Patient has had a stroke and is living at home. Wife states he goes to day care during the week to give her some relief so he can continue to stay at home. Yesterday he started spitting out. She thinks it's vomiting "he doesn't vomit like the rest of [place]". He also has had some mild coughing in between. She denies fever. He also has some suprapubic pain. He denies any urinary problems but is clearly documented he has trouble controlling his urine which his wife states is an old problem. Patient states she's been getting some chest pain he indicates his lower sternal area which he states he had this morning when he woke up. He cannot give me any details about the chest pain. They deny any diarrhea. Patient does have a cardiologist and is status post bypass surgery.  PCP Dr. Margaretmary Bayley Cardiologist Dr. Dorethea Clan  Past Medical History  Diagnosis Date  . Diabetes mellitus   . Stroke   . Coronary artery disease     Past Surgical History  Procedure Date  . Coronary artery bypass graft   . Coronary stent placement     No family history on file.  History  Substance Use Topics  . Smoking status: Never Smoker   . Smokeless tobacco: Not on file  . Alcohol Use: No  Lives at home Lives with spouse    Review of Systems  All other systems reviewed and are negative.    Allergies  Penicillins  Home Medications   Current Outpatient Rx  Name  Route  Sig  Dispense  Refill  . ACETAMINOPHEN 325 MG PO TABS   Oral   Take 2 tablets (650 mg total) by mouth every 6 (six) hours as needed for pain or fever.   30 tablet   0   . ALLOPURINOL 100 MG PO TABS   Oral   Take 100 mg by mouth  daily.         . ASPIRIN EC 81 MG PO TBEC   Oral   Take 81 mg by mouth daily.         Marland Kitchen CLOPIDOGREL BISULFATE 75 MG PO TABS   Oral   Take 75 mg by mouth daily.         Marland Kitchen DILTIAZEM HCL ER BEADS 180 MG PO CP24   Oral   Take 180 mg by mouth daily.         . DUTASTERIDE 0.5 MG PO CAPS   Oral   Take 0.5 mg by mouth daily.         Marland Kitchen GLIMEPIRIDE 4 MG PO TABS   Oral   Take 4 mg by mouth daily before breakfast.         . LANSOPRAZOLE 30 MG PO CPDR   Oral   Take 30 mg by mouth daily.         Marland Kitchen CITRUCEL PO   Oral   Take 2 tablets by mouth daily.         Marland Kitchen NABUMETONE 500 MG PO TABS   Oral   Take 500 mg by mouth 2 (two) times daily.         . OMEGA-3-ACID ETHYL ESTERS 1  G PO CAPS   Oral   Take 1 capsule (1 g total) by mouth daily.   30 capsule   0   . ROSUVASTATIN CALCIUM 10 MG PO TABS   Oral   Take 1 tablet (10 mg total) by mouth daily at 6 PM.   30 tablet   0   . SOLIFENACIN SUCCINATE 10 MG PO TABS   Oral   Take 10 mg by mouth daily.         Marland Kitchen VALSARTAN 160 MG PO TABS   Oral   Take 160 mg by mouth daily.           BP 107/64  Pulse 82  Temp 99.1 F (37.3 C) (Oral)  Resp 15  SpO2 99%  Vital signs normal    Physical Exam  Nursing note and vitals reviewed. Constitutional: He is oriented to person, place, and time. He appears well-developed and well-nourished.  Non-toxic appearance. He does not appear ill. No distress.  HENT:  Head: Normocephalic and atraumatic.  Right Ear: External ear normal.  Left Ear: External ear normal.  Nose: Nose normal. No mucosal edema or rhinorrhea.  Mouth/Throat: Oropharynx is clear and moist and mucous membranes are normal. No dental abscesses or uvula swelling.  Eyes: Conjunctivae normal and EOM are normal. Pupils are equal, round, and reactive to light.  Neck: Normal range of motion and full passive range of motion without pain. Neck supple.  Cardiovascular: Normal rate, regular rhythm and normal heart  sounds.  Exam reveals no gallop and no friction rub.   No murmur heard. Pulmonary/Chest: Effort normal and breath sounds normal. No respiratory distress. He has no wheezes. He has no rhonchi. He has no rales. He exhibits no tenderness and no crepitus.  Abdominal: Soft. Normal appearance and bowel sounds are normal. He exhibits no distension. There is no tenderness. There is no rebound and no guarding.  Musculoskeletal: Normal range of motion. He exhibits no edema and no tenderness.       Moves all extremities well.   Neurological: He is alert and oriented to person, place, and time. He has normal strength. No cranial nerve deficit.  Skin: Skin is warm, dry and intact. No rash noted. No erythema. No pallor.  Psychiatric: He has a normal mood and affect. His speech is normal and behavior is normal. His mood appears not anxious.    ED Course  Procedures (including critical care time)   Medications  0.9 %  sodium chloride infusion (0 mL Intravenous Stopped 03/18/12 1500)    Followed by  0.9 %  sodium chloride infusion (1000 mL Intravenous New Bag/Given 03/18/12 1506)  ondansetron (ZOFRAN) injection 4 mg (not administered)  ondansetron (ZOFRAN) injection 4 mg (4 mg Intravenous Given 03/18/12 1403)   Patient appears to be spitting clear saliva into his emesis bag.    Results for orders placed during the hospital encounter of 03/18/12  CBC WITH DIFFERENTIAL      Component Value Range   WBC 11.4 (*) 4.0 - 10.5 K/uL   RBC 3.90 (*) 4.22 - 5.81 MIL/uL   Hemoglobin 12.3 (*) 13.0 - 17.0 g/dL   HCT 16.1 (*) 09.6 - 04.5 %   MCV 96.2  78.0 - 100.0 fL   MCH 31.5  26.0 - 34.0 pg   MCHC 32.8  30.0 - 36.0 g/dL   RDW 40.9  81.1 - 91.4 %   Platelets 252  150 - 400 K/uL   Neutrophils Relative 69  43 -  77 %   Neutro Abs 7.9 (*) 1.7 - 7.7 K/uL   Lymphocytes Relative 18  12 - 46 %   Lymphs Abs 2.1  0.7 - 4.0 K/uL   Monocytes Relative 11  3 - 12 %   Monocytes Absolute 1.2 (*) 0.1 - 1.0 K/uL    Eosinophils Relative 2  0 - 5 %   Eosinophils Absolute 0.2  0.0 - 0.7 K/uL   Basophils Relative 0  0 - 1 %   Basophils Absolute 0.1  0.0 - 0.1 K/uL  BASIC METABOLIC PANEL      Component Value Range   Sodium 141  135 - 145 mEq/L   Potassium 4.3  3.5 - 5.1 mEq/L   Chloride 104  96 - 112 mEq/L   CO2 24  19 - 32 mEq/L   Glucose, Bld 102 (*) 70 - 99 mg/dL   BUN 23  6 - 23 mg/dL   Creatinine, Ser 1.61  0.50 - 1.35 mg/dL   Calcium 9.8  8.4 - 09.6 mg/dL   GFR calc non Af Amer 53 (*) >90 mL/min   GFR calc Af Amer 62 (*) >90 mL/min  URINALYSIS, ROUTINE W REFLEX MICROSCOPIC      Component Value Range   Color, Urine YELLOW  YELLOW   APPearance CLOUDY (*) CLEAR   Specific Gravity, Urine 1.020  1.005 - 1.030   pH 6.5  5.0 - 8.0   Glucose, UA NEGATIVE  NEGATIVE mg/dL   Hgb urine dipstick NEGATIVE  NEGATIVE   Bilirubin Urine SMALL (*) NEGATIVE   Ketones, ur 15 (*) NEGATIVE mg/dL   Protein, ur 045 (*) NEGATIVE mg/dL   Urobilinogen, UA 1.0  0.0 - 1.0 mg/dL   Nitrite NEGATIVE  NEGATIVE   Leukocytes, UA TRACE (*) NEGATIVE  TROPONIN I      Component Value Range   Troponin I <0.30  <0.30 ng/mL  URINE MICROSCOPIC-ADD ON      Component Value Range   Squamous Epithelial / LPF FEW (*) RARE   WBC, UA 3-6  <3 WBC/hpf   Bacteria, UA MANY (*) RARE   Laboratory interpretation all normal except mild anemia   Dg Chest 2 View  03/18/2012  *RADIOLOGY REPORT*  Clinical Data: Cough  CHEST - 2 VIEW  Comparison: 02/27/2008  Findings: Previous median sternotomy and CABG procedure. Mild cardiac enlargement.  No pleural effusion or edema.  There is no airspace consolidation identified.  IMPRESSION:  1.  No acute cardiopulmonary abnormalities.   Original Report Authenticated By: Signa Kell, M.D.      Date: 03/18/2012  Rate: 81  Rhythm: normal sinus rhythm  QRS Axis: normal  Intervals: normal  ST/T Wave abnormalities: nonspecific T wave changes  Conduction Disutrbances:none  Narrative Interpretation:    Old EKG Reviewed: unchanged from 07/15/2011      1. Vomiting    New Prescriptions   ONDANSETRON (ZOFRAN) 4 MG TABLET    Take 1 tablet (4 mg total) by mouth every 6 (six) hours.    Plan discharge  Devoria Albe, MD, FACEP    MDM           Ward Givens, MD 03/18/12 [phone]

## 2012-03-18 NOTE — ED Notes (Signed)
Pt. Stated, I started throwing up and sick on my stomach and I don't have any control of my urine.  This is been going on for 2 weeks.

## 2012-03-20 LAB — URINE CULTURE: Colony Count: >=100000

## 2012-03-21 ENCOUNTER — Telehealth (HOSPITAL_COMMUNITY): Payer: Self-pay | Admitting: Emergency Medicine

## 2012-03-21 NOTE — ED Notes (Signed)
Results received from [facility]. (+) URNC -> >/= 100,000 colonies, Aerococcus Species.  No Abx Rx given in ED.  Chart to MD office for review.

## 2012-03-22 ENCOUNTER — Telehealth (HOSPITAL_COMMUNITY): Payer: Self-pay | Admitting: Emergency Medicine

## 2012-03-22 NOTE — ED Notes (Signed)
Chart reviewed by B. Tran PA "Recommend Cipro 500 mg po BID x 7 days"  1/15 LVM requesting callback.   

## 2012-03-22 NOTE — ED Notes (Signed)
Spoke w/pts wife.  Informed of dx and need for addl tx.  Rx called to [facility] [phone].  Results also faxed to Dr Judie Petit. Nesi's attn @ [phone].

## 2012-09-17 ENCOUNTER — Inpatient Hospital Stay (HOSPITAL_COMMUNITY)
Admission: EM | Admit: 2012-09-17 | Discharge: 2012-09-22 | DRG: 312 | Disposition: A | Discharge status: Skilled Nursing Facility | Payer: Medicare Other | Attending: Internal Medicine | Admitting: Internal Medicine

## 2012-09-17 ENCOUNTER — Emergency Department (HOSPITAL_COMMUNITY): Payer: Medicare Other

## 2012-09-17 ENCOUNTER — Encounter (HOSPITAL_COMMUNITY): Payer: Self-pay | Admitting: *Deleted

## 2012-09-17 DIAGNOSIS — F0390 Unspecified dementia without behavioral disturbance: Secondary | ICD-10-CM | POA: Diagnosis present

## 2012-09-17 DIAGNOSIS — T675XXA Heat exhaustion, unspecified, initial encounter: Secondary | ICD-10-CM

## 2012-09-17 DIAGNOSIS — M545 Low back pain, unspecified: Secondary | ICD-10-CM | POA: Diagnosis present

## 2012-09-17 DIAGNOSIS — R55 Syncope and collapse: Principal | ICD-10-CM | POA: Diagnosis present

## 2012-09-17 DIAGNOSIS — M549 Dorsalgia, unspecified: Secondary | ICD-10-CM | POA: Diagnosis present

## 2012-09-17 DIAGNOSIS — E7800 Pure hypercholesterolemia, unspecified: Secondary | ICD-10-CM | POA: Diagnosis present

## 2012-09-17 DIAGNOSIS — G8929 Other chronic pain: Secondary | ICD-10-CM | POA: Insufficient documentation

## 2012-09-17 DIAGNOSIS — I10 Essential (primary) hypertension: Secondary | ICD-10-CM | POA: Diagnosis present

## 2012-09-17 DIAGNOSIS — Y92009 Unspecified place in unspecified non-institutional (private) residence as the place of occurrence of the external cause: Secondary | ICD-10-CM

## 2012-09-17 DIAGNOSIS — X30XXXA Exposure to excessive natural heat, initial encounter: Secondary | ICD-10-CM | POA: Diagnosis present

## 2012-09-17 DIAGNOSIS — K219 Gastro-esophageal reflux disease without esophagitis: Secondary | ICD-10-CM | POA: Insufficient documentation

## 2012-09-17 DIAGNOSIS — B954 Other streptococcus as the cause of diseases classified elsewhere: Secondary | ICD-10-CM | POA: Diagnosis present

## 2012-09-17 DIAGNOSIS — N179 Acute kidney failure, unspecified: Secondary | ICD-10-CM | POA: Diagnosis present

## 2012-09-17 DIAGNOSIS — E119 Type 2 diabetes mellitus without complications: Secondary | ICD-10-CM

## 2012-09-17 DIAGNOSIS — A498 Other bacterial infections of unspecified site: Secondary | ICD-10-CM | POA: Diagnosis present

## 2012-09-17 DIAGNOSIS — Z951 Presence of aortocoronary bypass graft: Secondary | ICD-10-CM

## 2012-09-17 DIAGNOSIS — Z66 Do not resuscitate: Secondary | ICD-10-CM | POA: Diagnosis present

## 2012-09-17 DIAGNOSIS — I252 Old myocardial infarction: Secondary | ICD-10-CM

## 2012-09-17 DIAGNOSIS — Y998 Other external cause status: Secondary | ICD-10-CM

## 2012-09-17 DIAGNOSIS — N390 Urinary tract infection, site not specified: Secondary | ICD-10-CM | POA: Diagnosis present

## 2012-09-17 DIAGNOSIS — I2510 Atherosclerotic heart disease of native coronary artery without angina pectoris: Secondary | ICD-10-CM | POA: Diagnosis present

## 2012-09-17 DIAGNOSIS — T6701XA Heatstroke and sunstroke, initial encounter: Secondary | ICD-10-CM | POA: Diagnosis present

## 2012-09-17 DIAGNOSIS — Z880 Allergy status to penicillin: Secondary | ICD-10-CM

## 2012-09-17 DIAGNOSIS — Z8673 Personal history of transient ischemic attack (TIA), and cerebral infarction without residual deficits: Secondary | ICD-10-CM

## 2012-09-17 HISTORY — DX: Essential (primary) hypertension: I10

## 2012-09-17 HISTORY — DX: Pure hypercholesterolemia, unspecified: E78.00

## 2012-09-17 HISTORY — DX: Dorsalgia, unspecified: M54.9

## 2012-09-17 HISTORY — DX: Gastro-esophageal reflux disease without esophagitis: K21.9

## 2012-09-17 HISTORY — DX: Other chronic pain: G89.29

## 2012-09-17 HISTORY — DX: Shortness of breath: R06.02

## 2012-09-17 HISTORY — DX: Unspecified dementia, unspecified severity, without behavioral disturbance, psychotic disturbance, mood disturbance, and anxiety: F03.90

## 2012-09-17 HISTORY — DX: Anemia, unspecified: D64.9

## 2012-09-17 LAB — GLUCOSE, CAPILLARY
Glucose-Capillary: 219 mg/dL — ABNORMAL HIGH (ref 70–99)
Glucose-Capillary: 150 mg/dL — ABNORMAL HIGH (ref 70–99)

## 2012-09-17 LAB — CREATININE, SERUM
Creatinine, Ser: 1.31 mg/dL (ref 0.50–1.35)
GFR calc Af Amer: 55 mL/min — ABNORMAL LOW (ref >90)
GFR calc non Af Amer: 48 mL/min — ABNORMAL LOW (ref >90)

## 2012-09-17 LAB — CBC
Platelets: 222 10*3/uL (ref 150–400)
RDW: 13.2 % (ref 11.5–15.5)
WBC: 14.7 10*3/uL — ABNORMAL HIGH (ref 4.0–10.5)

## 2012-09-17 LAB — CBC WITH DIFFERENTIAL/PLATELET
Basophils Absolute: 0 10*3/uL (ref 0.0–0.1)
Basophils Relative: 0 % (ref 0–1)
Eosinophils Absolute: 0.2 10*3/uL (ref 0.0–0.7)
Lymphs Abs: 2 10*3/uL (ref 0.7–4.0)
MCH: 31.7 pg (ref 26.0–34.0)
MCHC: 33.9 g/dL (ref 30.0–36.0)
Neutrophils Relative %: 65 % (ref 43–77)
Platelets: 240 10*3/uL (ref 150–400)
RBC: 3.72 MIL/uL — ABNORMAL LOW (ref 4.22–5.81)
RDW: 13 % (ref 11.5–15.5)

## 2012-09-17 LAB — POCT I-STAT 3, VENOUS BLOOD GAS (G3P V)
Acid-base deficit: 3 mmol/L — ABNORMAL HIGH (ref 0.0–2.0)
O2 Saturation: 96 %
pCO2, Ven: 32.7 mmHg — ABNORMAL LOW (ref 45.0–50.0)
pO2, Ven: 80 mmHg — ABNORMAL HIGH (ref 30.0–45.0)

## 2012-09-17 LAB — COMPREHENSIVE METABOLIC PANEL
ALT: 8 U/L (ref 0–53)
AST: 14 U/L (ref 0–37)
Albumin: 3.7 g/dL (ref 3.5–5.2)
Alkaline Phosphatase: 81 U/L (ref 39–117)
Potassium: 5.5 mEq/L — ABNORMAL HIGH (ref 3.5–5.1)
Sodium: 136 mEq/L (ref 135–145)
Total Protein: 7.6 g/dL (ref 6.0–8.3)

## 2012-09-17 LAB — URINALYSIS, ROUTINE W REFLEX MICROSCOPIC
Glucose, UA: NEGATIVE mg/dL
Specific Gravity, Urine: 1.028 (ref 1.005–1.030)

## 2012-09-17 LAB — URINE MICROSCOPIC-ADD ON

## 2012-09-17 LAB — POCT I-STAT TROPONIN I: Troponin i, poc: 0.01 ng/mL (ref 0.00–0.08)

## 2012-09-17 LAB — POCT I-STAT, CHEM 8
BUN: 27 mg/dL — ABNORMAL HIGH (ref 6–23)
Creatinine, Ser: 1.5 mg/dL — ABNORMAL HIGH (ref 0.50–1.35)
Hemoglobin: 12.6 g/dL — ABNORMAL LOW (ref 13.0–17.0)
Potassium: 5.5 mEq/L — ABNORMAL HIGH (ref 3.5–5.1)
Sodium: 141 mEq/L (ref 135–145)

## 2012-09-17 LAB — CG4 I-STAT (LACTIC ACID): Lactic Acid, Venous: 2.32 mmol/L — ABNORMAL HIGH (ref 0.5–2.2)

## 2012-09-17 MED ORDER — DEXTROSE 5 % IV SOLN
1.0000 g | Freq: Once | INTRAVENOUS | Status: DC
  Filled 2012-09-17: qty 10

## 2012-09-17 MED ORDER — ATORVASTATIN CALCIUM 10 MG PO TABS
10.0000 mg | ORAL_TABLET | Freq: Every day | ORAL | Status: DC
  Administered 2012-09-18 – 2012-09-21 (×4): 10 mg via ORAL
  Filled 2012-09-17 (×6): qty 1

## 2012-09-17 MED ORDER — ACETAMINOPHEN 325 MG PO TABS: 650.0000 mg | ORAL_TABLET | Freq: Four times a day (QID) | ORAL | Status: DC | PRN

## 2012-09-17 MED ORDER — DUTASTERIDE 0.5 MG PO CAPS
0.5000 mg | ORAL_CAPSULE | Freq: Every day | ORAL | Status: DC
  Administered 2012-09-18 – 2012-09-22 (×5): 0.5 mg via ORAL
  Filled 2012-09-17 (×5): qty 1

## 2012-09-17 MED ORDER — OMEGA-3-ACID ETHYL ESTERS 1 G PO CAPS
1.0000 g | ORAL_CAPSULE | Freq: Every day | ORAL | Status: DC
  Administered 2012-09-18 – 2012-09-22 (×5): 1 g via ORAL
  Filled 2012-09-17 (×5): qty 1

## 2012-09-17 MED ORDER — POLYETHYLENE GLYCOL 3350 17 G PO PACK
17.0000 g | PACK | Freq: Every day | ORAL | Status: DC | PRN
  Filled 2012-09-17: qty 1

## 2012-09-17 MED ORDER — ACETAMINOPHEN 650 MG RE SUPP: 650.0000 mg | Freq: Four times a day (QID) | RECTAL | Status: DC | PRN

## 2012-09-17 MED ORDER — SODIUM CHLORIDE 0.9 % IV BOLUS (SEPSIS)
1000.0000 mL | Freq: Once | INTRAVENOUS | Status: AC
  Administered 2012-09-17: 1000 mL via INTRAVENOUS

## 2012-09-17 MED ORDER — ENOXAPARIN SODIUM 40 MG/0.4ML ~~LOC~~ SOLN
40.0000 mg | SUBCUTANEOUS | Status: DC
  Administered 2012-09-17 – 2012-09-21 (×5): 40 mg via SUBCUTANEOUS
  Filled 2012-09-17 (×6): qty 0.4

## 2012-09-17 MED ORDER — OCUVITE PO TABS
1.0000 | ORAL_TABLET | Freq: Every day | ORAL | Status: DC
  Administered 2012-09-18 – 2012-09-22 (×5): 1 via ORAL
  Filled 2012-09-17 (×6): qty 1

## 2012-09-17 MED ORDER — SODIUM CHLORIDE 0.9 % IJ SOLN
3.0000 mL | Freq: Two times a day (BID) | INTRAMUSCULAR | Status: DC
  Administered 2012-09-19 – 2012-09-20 (×2): 3 mL via INTRAVENOUS

## 2012-09-17 MED ORDER — SODIUM CHLORIDE 0.9 % IV SOLN
INTRAVENOUS | Status: DC
Start: 2012-09-17 — End: 2012-09-22
  Administered 2012-09-17: 17:00:00 via INTRAVENOUS
  Administered 2012-09-18: 1000 mL via INTRAVENOUS
  Administered 2012-09-19: 04:00:00 via INTRAVENOUS

## 2012-09-17 MED ORDER — IRBESARTAN 150 MG PO TABS
150.0000 mg | ORAL_TABLET | Freq: Every day | ORAL | Status: DC
  Administered 2012-09-18 – 2012-09-22 (×5): 150 mg via ORAL
  Filled 2012-09-17 (×5): qty 1

## 2012-09-17 MED ORDER — OMEGA-3 FATTY ACIDS 1000 MG PO CAPS: 1.0000 g | ORAL_CAPSULE | Freq: Every day | ORAL | Status: DC

## 2012-09-17 MED ORDER — INSULIN ASPART 100 UNIT/ML ~~LOC~~ SOLN: 0.0000 [IU] | Freq: Every day | SUBCUTANEOUS | Status: DC

## 2012-09-17 MED ORDER — INSULIN ASPART 100 UNIT/ML ~~LOC~~ SOLN
0.0000 [IU] | Freq: Three times a day (TID) | SUBCUTANEOUS | Status: DC
  Administered 2012-09-18: 3 [IU] via SUBCUTANEOUS
  Administered 2012-09-19: 2 [IU] via SUBCUTANEOUS
  Administered 2012-09-19: 3 [IU] via SUBCUTANEOUS
  Administered 2012-09-20 (×2): 2 [IU] via SUBCUTANEOUS
  Administered 2012-09-20 – 2012-09-21 (×2): 3 [IU] via SUBCUTANEOUS
  Administered 2012-09-21: 18:00:00 via SUBCUTANEOUS
  Administered 2012-09-22 (×2): 2 [IU] via SUBCUTANEOUS

## 2012-09-17 MED ORDER — DOCUSATE SODIUM 100 MG PO CAPS
100.0000 mg | ORAL_CAPSULE | Freq: Two times a day (BID) | ORAL | Status: DC
  Administered 2012-09-18 – 2012-09-22 (×8): 100 mg via ORAL
  Filled 2012-09-17 (×11): qty 1

## 2012-09-17 MED ORDER — HYDROCODONE-ACETAMINOPHEN 5-325 MG PO TABS
1.0000 | ORAL_TABLET | ORAL | Status: DC | PRN
  Administered 2012-09-21: 1 via ORAL
  Filled 2012-09-17: qty 1

## 2012-09-17 MED ORDER — ASPIRIN EC 81 MG PO TBEC
81.0000 mg | DELAYED_RELEASE_TABLET | Freq: Every day | ORAL | Status: DC
  Administered 2012-09-18 – 2012-09-22 (×5): 81 mg via ORAL
  Filled 2012-09-17 (×6): qty 1

## 2012-09-17 MED ORDER — PANTOPRAZOLE SODIUM 20 MG PO TBEC
20.0000 mg | DELAYED_RELEASE_TABLET | Freq: Every day | ORAL | Status: DC
  Administered 2012-09-18 – 2012-09-22 (×5): 20 mg via ORAL
  Filled 2012-09-17 (×5): qty 1

## 2012-09-17 MED ORDER — HYDROMORPHONE HCL PF 1 MG/ML IJ SOLN: 0.5000 mg | INTRAMUSCULAR | Status: DC | PRN

## 2012-09-17 MED ORDER — BISACODYL 5 MG PO TBEC
5.0000 mg | DELAYED_RELEASE_TABLET | Freq: Every day | ORAL | Status: DC | PRN
  Administered 2012-09-18: 5 mg via ORAL
  Filled 2012-09-17 (×3): qty 1

## 2012-09-17 NOTE — ED Notes (Signed)
Pt was iced down by EMS.

## 2012-09-17 NOTE — ED Notes (Signed)
Pt was seen sitting up on front porch by neighbors at 1000 then found laying back with feet in the air and unresponsive.  Pt moans to pain.  Pt has axillary temp of 104, cbg 273, and pt is nonverbal but grimaces and moans with painful stimuli

## 2012-09-17 NOTE — ED Notes (Signed)
Admitting MD at bedside. Wife remains at bedside

## 2012-09-17 NOTE — ED Notes (Signed)
Pt arrived with skin flushed, red, dry & very warm to touch. Mucous membranes dry. Pt with eyes closed, will follow commands & open eyes but slow to respond. Answers simple questions appropriately, also slow to respond & speaks with a soft voice. Denies any pain. Oriented to person only. Strength equal BUE, BLE

## 2012-09-17 NOTE — ED Notes (Signed)
Dr Shyrl Numbers stated pt no longer needed to be ambulated in the hall

## 2012-09-17 NOTE — H&P (Addendum)
[facility] History and Physical  Albert Nelson ZOX:[id number] DOB: Jun 14, 1923 DOA: 09/17/2012  Referring physician: Emergency Department PCP: Laurena Slimmer, MD  Specialists:   Chief Complaint: Unresponsiveness  HPI: Albert Nelson is a 79 y.o. male with a pmh of diabetes and cad s/p CABG and multiple stenting who presents to the ED after being found unresponsive. The patient reportedly was locked out of his house at around 10am and shortly thereafter, became unresponsive in the heat. The patient was found on the ground by neighbors and the patient was then brought to the ED via EMS. The patient was noted to be initially hyperthermic, requiring icing down. In the ED, the patient had a presenting max temp of 100.9. IVF were started. The patient was noted to have large leuks and many bacteria in the urine.  The hospitalist service was consulted for possible admission.  Review of Systems: Syncope, back pain, chills, the remainder of the 10pt review of systems reviewed and are negative  Past Medical History  Diagnosis Date  . Dementia   . Diabetes mellitus without complication   . Chronic back pain    History reviewed. No pertinent past surgical history. Social History:  has no tobacco, alcohol, and drug history on file.   Allergies  Allergen Reactions  . Penicillins Anaphylaxis    No family history on file. DM and heart disease, mother and father (be sure to complete)  Prior to Admission medications   Medication Sig Start Date End Date Taking? Authorizing Provider  acetaminophen (TYLENOL) 500 MG tablet Take 500 mg by mouth every 6 (six) hours as needed for pain.   Yes Historical Provider, MD  aspirin EC 81 MG tablet Take 81 mg by mouth daily.   Yes Historical Provider, MD  beta carotene w/minerals (OCUVITE) tablet Take 1 tablet by mouth daily.   Yes Historical Provider, MD  dutasteride (AVODART) 0.5 MG capsule Take 0.5 mg by mouth daily.   Yes Historical Provider, MD  fish  oil-omega-3 fatty acids 1000 MG capsule Take 1 g by mouth daily.   Yes Historical Provider, MD  GLIMEPIRIDE PO Take 1 tablet by mouth daily.   Yes Historical Provider, MD  lansoprazole (PREVACID) 30 MG capsule Take 30 mg by mouth daily.   Yes Historical Provider, MD  rosuvastatin (CRESTOR) 10 MG tablet Take 10 mg by mouth daily.   Yes Historical Provider, MD  Solifenacin Succinate (VESICARE PO) Take 1 tablet by mouth daily.   Yes Historical Provider, MD  valsartan-hydrochlorothiazide (DIOVAN-HCT) 160-12.5 MG per tablet Take 1 tablet by mouth daily.   Yes Historical Provider, MD   Physical Exam: Filed Vitals:   09/17/12 1500 09/17/12 1545 09/17/12 1615 09/17/12 1730  BP: 150/54 126/56 101/57 116/50  Pulse: 97 85 79 70  Temp: 100.9 F (38.3 C) 99.7 F (37.6 C) 99 F (37.2 C) 98.6 F (37 C)  TempSrc: Core (Comment)   Core (Comment)  Resp: 16 20 19 23   SpO2: 99% 99% 100% 98%     Nelson:  Awake, in nad  Eyes: PERRL  ENT: membranes dry, dentition fair  Neck: trachea midline, neck supple  Cardiovascular: regular, s1, s2  Respiratory: normal resp effort, no wheezing  Abdomen: soft, nondistended  Skin: flushed skin, no abnormal skin lesions seen  Musculoskeletal: perfused, no clubbing or cyanosis, no LE edema  Psychiatric: normal  Neurologic: cn2-12 grossly in tact, strength and sensation intact  Labs on Admission:  Basic Metabolic Panel:  Recent Labs Lab 09/17/12 1431 09/17/12  1443  NA 136 141  K 5.5* 5.5*  CL 103 108  CO2 21  --   GLUCOSE 223* 226*  BUN 27* 27*  CREATININE 1.44* 1.50*  CALCIUM 9.7  --   MG 2.1  --    Liver Function Tests:  Recent Labs Lab 09/17/12 1431  AST 14  ALT 8  ALKPHOS 81  BILITOT 0.4  PROT 7.6  ALBUMIN 3.7   No results found for this basename: LIPASE, AMYLASE,  in the last 168 hours No results found for this basename: AMMONIA,  in the last 168 hours CBC:  Recent Labs Lab 09/17/12 1431 09/17/12 1443  WBC 8.9  --    NEUTROABS 5.8  --   HGB 11.8* 12.6*  HCT 34.8* 37.0*  MCV 93.5  --   PLT 240  --    Cardiac Enzymes:  Recent Labs Lab 09/17/12 1431  CKTOTAL 50    BNP (last 3 results) No results found for this basename: PROBNP,  in the last 8760 hours CBG: No results found for this basename: GLUCAP,  in the last 168 hours  Radiological Exams on Admission: Dg Chest 2 View  09/17/2012   *RADIOLOGY REPORT*  Clinical Data: Chest pain and fever  CHEST - 2 VIEW  Comparison: None  Findings: Prior open heart surgery.  Negative for heart failure. Negative for pneumonia or effusion.  Lungs are clear.  IMPRESSION: No active cardiopulmonary abnormality.   Original Report Authenticated By: Janeece Riggers, M.D.   Ct Head Wo Contrast  09/17/2012   *RADIOLOGY REPORT*  Clinical Data: Elderly patient passed out.  Elevated blood sugar.  CT HEAD WITHOUT CONTRAST  Technique:  Contiguous axial images were obtained from the base of the skull through the vertex without contrast.  Comparison: None.  Findings: No skull fracture or intracranial hemorrhage.  Atrophy.  Superimposed hydrocephalus suspected.  Small vessel disease type changes without CT evidence of large acute infarct.  No intracranial mass lesion detected on this unenhanced exam.  Vascular calcifications.  Mastoid air cells and middle ear cavities as well as paranasal sinuses are clear.  Orbital structures unremarkable.  IMPRESSION: No skull fracture or intracranial hemorrhage.  Atrophy.  Superimposed hydrocephalus suspected.  Small vessel disease type changes without CT evidence of large acute infarct.   Original Report Authenticated By: Lacy Duverney, M.D.    Assessment/Plan Principal Problem:   Syncope and collapse Active Problems:   UTI (lower urinary tract infection)   Diabetes mellitus   CAD (coronary artery disease)   Back pain   Syncope: - Likely secondary to heat stroke with active UTI - Cont IVF and abx - Admit to med-tele - Hold diuretics for  now - Given hx of CAD, will check cardiac enzymes  UTI: - Reports PCN allergy - cont on empiric Cipro for now - Will follow up urine culture  Diabetes: - Will continue the patient on SSI - hold oral meds for now - Diabetic diet  CAD: - Stable. Asymptomatic - Cont tele  Back pain: - Cont pain meds as tolerated  Code Status: DNR - confirmed with pt's wife (must indicate code status--if unknown or must be presumed, indicate so) Family Communication: Pt and his wife in room (indicate person spoken with, if applicable, with phone number if by telephone) Disposition Plan: Pending (indicate anticipated LOS)  Time spent:  Huntington Leverich K [facility] Pager [phone]  If 7PM-7AM, please contact night-coverage [email or web address] Password [facility] 09/17/2012, 5:36 PM
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
## 2012-09-17 NOTE — ED Notes (Addendum)
Pt wife now at beside. Obtaining information about pt in order to update pt chart. Wife reports pt is acting per his norm.

## 2012-09-17 NOTE — Progress Notes (Signed)
NURSING PROGRESS NOTE  Albert Nelson [id number] Admission Data: 09/17/2012 6:47 PM Attending Provider: Jerald Kief, MD WUJ:WJXBJ,YNWGNFA S, MD Code Status: dnr    Albert Nelson is a 79 y.o. male patient admitted from ED:  -No acute distress noted.  -No complaints of shortness of breath.  -No complaints of chest pain.   Cardiac Monitoring: Box # [id number] in place. Cardiac monitor yields:normal sinus rhythm.  Blood pressure 135/66, pulse 69, temperature 98.6 F (37 C), temperature source Core (Comment), resp. rate 20, height 5\' 6"  (1.676 m), weight 89.6 kg (197 lb 8.5 oz), SpO2 99.00%.   IV Fluids:  IV in place, occlusive dsg intact without redness, IV cath hand right, condition patent and no redness and left, condition patent and no redness none.   Allergies:  Penicillins  Past Medical History:   has a past medical history of Dementia; Diabetes mellitus without complication; Chronic back pain; GERD (gastroesophageal reflux disease); High cholesterol; and Hypertension.  Past Surgical History:   has past surgical history that includes Cardiac surgery.  Social History:   reports that he has never smoked. He does not have any smokeless tobacco history on file. He reports that he does not drink alcohol or use illicit drugs.  Skin: see assessment   Patient/Family orientated to room. Information packet given to patient/family. Admission inpatient armband information verified with patient/family to include name and date of birth and placed on patient arm. Side rails up x 2, fall assessment and education completed with patient/family. Patient/family able to verbalize understanding of risk associated with falls and verbalized understanding to call for assistance before getting out of bed. Call light within reach. Patient/family able to voice and demonstrate understanding of unit orientation instructions.    Will continue to evaluate and treat per MD orders.   Madelin Rear, MSN, RN,  [employer]

## 2012-09-17 NOTE — ED Notes (Signed)
MD paged & informed of rocephin ordered by ED MD & pt's allergy to penicillin. Order discontinued

## 2012-09-17 NOTE — ED Notes (Signed)
Patient transported to CT/Xray. 

## 2012-09-18 ENCOUNTER — Inpatient Hospital Stay (HOSPITAL_COMMUNITY): Payer: Medicare Other

## 2012-09-18 ENCOUNTER — Encounter (HOSPITAL_COMMUNITY): Payer: Self-pay | Admitting: General Practice

## 2012-09-18 DIAGNOSIS — E119 Type 2 diabetes mellitus without complications: Secondary | ICD-10-CM

## 2012-09-18 DIAGNOSIS — G8929 Other chronic pain: Secondary | ICD-10-CM

## 2012-09-18 DIAGNOSIS — N390 Urinary tract infection, site not specified: Secondary | ICD-10-CM

## 2012-09-18 DIAGNOSIS — M549 Dorsalgia, unspecified: Secondary | ICD-10-CM

## 2012-09-18 LAB — GLUCOSE, CAPILLARY
Glucose-Capillary: 82 mg/dL (ref 70–99)
Glucose-Capillary: 153 mg/dL — ABNORMAL HIGH (ref 70–99)

## 2012-09-18 LAB — TROPONIN I: Troponin I: <0.3 ng/mL (ref <0.30)

## 2012-09-18 LAB — COMPREHENSIVE METABOLIC PANEL
AST: 11 U/L (ref 0–37)
Albumin: 3 g/dL — ABNORMAL LOW (ref 3.5–5.2)
Calcium: 8.9 mg/dL (ref 8.4–10.5)
Creatinine, Ser: 1.12 mg/dL (ref 0.50–1.35)
GFR calc non Af Amer: 57 mL/min — ABNORMAL LOW (ref >90)

## 2012-09-18 LAB — CBC
Hemoglobin: 10.3 g/dL — ABNORMAL LOW (ref 13.0–17.0)
MCH: 31 pg (ref 26.0–34.0)
MCV: 94 fL (ref 78.0–100.0)
RBC: 3.32 MIL/uL — ABNORMAL LOW (ref 4.22–5.81)

## 2012-09-18 MED ORDER — PNEUMOCOCCAL VAC POLYVALENT 25 MCG/0.5ML IJ INJ
0.5000 mL | INJECTION | INTRAMUSCULAR | Status: AC
  Administered 2012-09-19: 0.5 mL via INTRAMUSCULAR
  Filled 2012-09-18: qty 0.5

## 2012-09-18 MED ORDER — CIPROFLOXACIN IN D5W 400 MG/200ML IV SOLN
400.0000 mg | Freq: Two times a day (BID) | INTRAVENOUS | Status: DC
  Administered 2012-09-18 – 2012-09-19 (×2): 400 mg via INTRAVENOUS
  Filled 2012-09-18 (×3): qty 200

## 2012-09-18 NOTE — Progress Notes (Signed)
Pt remove tele, refuse to let RN put tele back on. MD made aware.
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
## 2012-09-18 NOTE — Progress Notes (Signed)
[facility] PROGRESS NOTE  Albert Nelson WUJ:[id number] DOB: 05-16-1923 DOA: 09/17/2012 PCP: Laurena Slimmer, MD  HPI/Subjective: Feels okay, denies any fever or chills.  Assessment/Plan:  Syncope:  -Likely secondary to heat stroke/dehydration with active UTI  -Hold diuretics for now, hydrate aggressively with IV fluids. -Currently denies any syncope.  UTI:  - Reports PCN allergy. - cont on empiric Cipro for now  - Will follow up urine culture and adjust antibiotics according to culture results.  Acute renal failure -Secondary to dehydration volume depletion. -Presented with creatinine of 1.5, creatinine is normal today. This is resolved.  Diabetes:  - Will continue the patient on SSI  - hold oral meds for now  - Diabetic diet   CAD:  - Stable. Asymptomatic  - Cont tele   Back pain:  -Cont pain meds as tolerated -Reported more chest pain to the left side, I will get a rib x-ray to rule out any fractures from fall.   Code Status: Full code Family Communication: Plan discussed with the patient Disposition Plan: Remain inpatient   Consultants:  None  Procedures:  None  Antibiotics:  Ciprofloxacin started on 7/14    Objective: Filed Vitals:   09/17/12 1730 09/17/12 1827 09/17/12 2136 09/18/12 0547  BP: 116/50 135/66 146/65 115/63  Pulse: 70 69 65 59  Temp: 98.6 F (37 C)  97.7 F (36.5 C) 97.8 F (36.6 C)  TempSrc: Core (Comment) Oral Oral Oral  Resp: 23 20 18 18   Height:  5\' 6"  (1.676 m)    Weight:  89.6 kg (197 lb 8.5 oz)    SpO2: 98% 99% 100% 100%    Intake/Output Summary (Last 24 hours) at 09/18/12 1342 Last data filed at 09/18/12 0600  Gross per 24 hour  Intake 1947.5 ml  Output   1100 ml  Net  847.5 ml   Filed Weights   09/17/12 1827  Weight: 89.6 kg (197 lb 8.5 oz)    Exam: General: Alert and awake, oriented x3, not in any acute distress. HEENT: anicteric sclera, pupils reactive to light and accommodation, EOMI CVS:  S1-S2 clear, no murmur rubs or gallops Chest: clear to auscultation bilaterally, no wheezing, rales or rhonchi Abdomen: soft nontender, nondistended, normal bowel sounds, no organomegaly Extremities: no cyanosis, clubbing or edema noted bilaterally Neuro: Cranial nerves II-XII intact, no focal neurological deficits   Data Reviewed: Basic Metabolic Panel:  Recent Labs Lab 09/17/12 1431 09/17/12 1443 09/17/12 1714 09/18/12 0500  NA 136 141  --  138  K 5.5* 5.5*  --  4.3  CL 103 108  --  108  CO2 21  --   --  23  GLUCOSE 223* 226*  --  76  BUN 27* 27*  --  21  CREATININE 1.44* 1.50* 1.31 1.12  CALCIUM 9.7  --   --  8.9  MG 2.1  --   --   --    Liver Function Tests:  Recent Labs Lab 09/17/12 1431 09/18/12 0500  AST 14 11  ALT 8 7  ALKPHOS 81 59  BILITOT 0.4 0.5  PROT 7.6 6.4  ALBUMIN 3.7 3.0*   No results found for this basename: LIPASE, AMYLASE,  in the last 168 hours No results found for this basename: AMMONIA,  in the last 168 hours CBC:  Recent Labs Lab 09/17/12 1431 09/17/12 1443 09/17/12 1714 09/18/12 0500  WBC 8.9  --  14.7* 11.3*  NEUTROABS 5.8  --   --   --  HGB 11.8* 12.6* 10.8* 10.3*  HCT 34.8* 37.0* 32.5* 31.2*  MCV 93.5  --  94.2 94.0  PLT 240  --  222 209   Cardiac Enzymes:  Recent Labs Lab 09/17/12 1431 09/17/12 1742 09/17/12 2312 09/18/12 0500  CKTOTAL 50  --   --   --   TROPONINI  --  <0.30 <0.30 <0.30   BNP (last 3 results) No results found for this basename: PROBNP,  in the last 8760 hours CBG:  Recent Labs Lab 09/17/12 1834 09/17/12 2138 09/18/12 0733 09/18/12 1146  GLUCAP 219* 150* 82 93    No results found for this or any previous visit (from the past 240 hour(s)).   Studies: Dg Chest 2 View  09/17/2012   *RADIOLOGY REPORT*  Clinical Data: Chest pain and fever  CHEST - 2 VIEW  Comparison: None  Findings: Prior open heart surgery.  Negative for heart failure. Negative for pneumonia or effusion.  Lungs are clear.   IMPRESSION: No active cardiopulmonary abnormality.   Original Report Authenticated By: Janeece Riggers, M.D.   Ct Head Wo Contrast  09/17/2012   *RADIOLOGY REPORT*  Clinical Data: Elderly patient passed out.  Elevated blood sugar.  CT HEAD WITHOUT CONTRAST  Technique:  Contiguous axial images were obtained from the base of the skull through the vertex without contrast.  Comparison: None.  Findings: No skull fracture or intracranial hemorrhage.  Atrophy.  Superimposed hydrocephalus suspected.  Small vessel disease type changes without CT evidence of large acute infarct.  No intracranial mass lesion detected on this unenhanced exam.  Vascular calcifications.  Mastoid air cells and middle ear cavities as well as paranasal sinuses are clear.  Orbital structures unremarkable.  IMPRESSION: No skull fracture or intracranial hemorrhage.  Atrophy.  Superimposed hydrocephalus suspected.  Small vessel disease type changes without CT evidence of large acute infarct.   Original Report Authenticated By: Lacy Duverney, M.D.    Scheduled Meds: . aspirin EC  81 mg Oral Daily  . atorvastatin  10 mg Oral q1800  . beta carotene w/minerals  1 tablet Oral Daily  . docusate sodium  100 mg Oral BID  . dutasteride  0.5 mg Oral Daily  . enoxaparin (LOVENOX) injection  40 mg Subcutaneous Q24H  . insulin aspart  0-15 Units Subcutaneous TID WC  . insulin aspart  0-5 Units Subcutaneous QHS  . irbesartan  150 mg Oral Daily  . omega-3 acid ethyl esters  1 g Oral Daily  . pantoprazole  20 mg Oral Daily  . pneumococcal 23 valent vaccine  0.5 mL Intramuscular Tomorrow-1000  . sodium chloride  3 mL Intravenous Q12H   Continuous Infusions: . sodium chloride 1,000 mL (09/18/12 0807)    Principal Problem:   Syncope and collapse Active Problems:   UTI (lower urinary tract infection)   Diabetes mellitus   CAD (coronary artery disease)   Back pain    Time spent: 35 minutes    [facility] A  [facility] Pager  [phone]. If 7PM-7AM, please contact night-coverage at [email or web address], password [facility] 09/18/2012, 1:42 PM  LOS: 1 day

## 2012-09-18 NOTE — Evaluation (Signed)
Occupational Therapy Evaluation Patient Details Name: Albert Nelson MRN: [id number] DOB: 24-Dec-1923 Today's Date: 09/18/2012 Time: 4098-1191 OT Time Calculation (min): 22 min  OT Assessment / Plan / Recommendation History of present illness Albert Nelson is a 79 y.o. male with a pmh of diabetes and cad s/p CABG and multiple stenting who presents to the ED after being found unresponsive. The patient reportedly was locked out of his house at around 10am and shortly thereafter, became unresponsive in the heat   Clinical Impression   Pt admitted with above. Pt currently with functional limitations due to the deficits listed below (see OT Problem List).  Pt will benefit from skilled OT to increase their safety and independence with ADL and functional mobility for ADL to facilitate discharge to venue listed below.       OT Assessment  Patient needs continued OT Services    Follow Up Recommendations  Home health OT       Equipment Recommendations  None recommended by OT       Frequency  Min 2X/week    Precautions / Restrictions Precautions Precautions: Fall   Pertinent Vitals/Pain Pain in left knee to ankle, repositioned    ADL  Eating/Feeding: Performed;Set up Where Assessed - Eating/Feeding: Chair Grooming: Simulated;Set up;Supervision/safety Where Assessed - Grooming: Supported standing Upper Body Bathing: Set up;Supervision/safety Where Assessed - Upper Body Bathing: Unsupported sitting Lower Body Bathing: Simulated;Moderate assistance Where Assessed - Lower Body Bathing: Supported sit to stand Upper Body Dressing: Simulated;Moderate assistance Where Assessed - Upper Body Dressing: Unsupported sitting Lower Body Dressing: Simulated;Maximal assistance Where Assessed - Lower Body Dressing: Supported sit to stand Toilet Transfer: Simulated;Minimal assistance Toilet Transfer Method: Sit to Barista:  (Bed to recliner, recliner to recliner) Toileting  - Clothing Manipulation and Hygiene: Simulated;Moderate assistance Where Assessed - Toileting Clothing Manipulation and Hygiene: Standing Equipment Used: Gait belt;Rolling walker Transfers/Ambulation Related to ADLs: Min A for all with RW ADL Comments: Pt could not donn his socks, wife reports that she usually puts them on for him    OT Diagnosis: Generalized weakness;Cognitive deficits;Acute pain  OT Problem List: Decreased strength;Decreased activity tolerance;Impaired balance (sitting and/or standing);Decreased safety awareness;Decreased cognition;Pain;Decreased knowledge of use of DME or AE OT Treatment Interventions: Self-care/ADL training;Balance training;Patient/family education;DME and/or AE instruction;Therapeutic activities   OT Goals(Current goals can be found in the care plan section) Acute Rehab OT Goals Patient Stated Goal: go home OT Goal Formulation: With patient Time For Goal Achievement: 09/25/12 Potential to Achieve Goals: Good ADL Goals Pt Will Perform Grooming: with min guard assist;standing Pt Will Perform Upper Body Bathing: sitting;standing;with min guard assist Pt Will Perform Lower Body Bathing: with min assist;sit to/from stand Pt Will Transfer to Toilet: with min guard assist;ambulating;bedside commode Pt Will Perform Toileting - Clothing Manipulation and hygiene: with min assist;sit to/from stand Additional ADL Goal #1: Pt will be min A to get in and OOB.  Visit Information  Last OT Received On: 09/18/12 Assistance Needed: +2 (safety for chair with ambulation) PT/OT Co-Evaluation/Treatment: Yes History of Present Illness: Albert Nelson is a 79 y.o. male with a pmh of diabetes and cad s/p CABG and multiple stenting who presents to the ED after being found unresponsive. The patient reportedly was locked out of his house at around 10am and shortly thereafter, became unresponsive in the heat       Prior Functioning     Home Living Family/patient expects  to be discharged to:: Private residence Living Arrangements: Spouse/significant other  Available Help at Discharge: Available 24 hours/day Type of Home: House Home Access: Stairs to enter Entrance Stairs-Number of Steps: 1 Home Equipment: Walker - 2 wheels;Shower seat;Bedside commode;Hand held shower head;Cane - single point Additional Comments: walk in shower, one step up in the house Prior Function Level of Independence: Independent with assistive device(s);Needs assistance Gait / Transfers Assistance Needed: limited gait of grossly 15' at baseline. Wife has to assist with transfers out of low chairs ADL's / Homemaking Assistance Needed: wife provides supervision for ADLs and assists with lower body Comments: wife with pt all day Sat and normally leaves for church on Sunday but states she will no longer do this and have 24hr supervision for him.  Communication Communication: No difficulties         Vision/Perception Vision - History Baseline Vision: Wears glasses all the time Patient Visual Report: No change from baseline   Cognition  Cognition Arousal/Alertness: Awake/alert Behavior During Therapy: Flat affect Overall Cognitive Status: Impaired/Different from baseline Area of Impairment: Orientation;Memory;Safety/judgement;Problem solving Orientation Level: Time;Situation;Place Memory: Decreased short-term memory Safety/Judgement: Decreased awareness of deficits;Decreased awareness of safety Problem Solving: Slow processing General Comments: Pt repeatedly stating need to urinate despite foley and education for such    Extremity/Trunk Assessment Upper Extremity Assessment Upper Extremity Assessment: Overall WFL for tasks assessed Lower Extremity Assessment Lower Extremity Assessment: Generalized weakness Cervical / Trunk Assessment Cervical / Trunk Assessment: Kyphotic     Mobility Bed Mobility Bed Mobility: Supine to Sit Supine to Sit: HOB elevated;4: Min assist;With  rails Details for Bed Mobility Assistance: cueing  and assist to bring legs to EOB and elevate trunk even with HOB 30 degrees unable to sequence how to push into bed to elevate from surface without max multimodal cues Transfers Sit to Stand: 4: Min assist;From bed;From chair/3-in-1 Stand to Sit: 4: Min assist;To chair/3-in-1;To bed Details for Transfer Assistance: max cueing for hand placement and safety with transfers x 2           End of Session OT - End of Session Equipment Utilized During Treatment: Gait belt Activity Tolerance: Patient limited by pain Patient left: in chair;with call bell/phone within reach;with family/visitor present  GO Functional Assessment Tool Used: Clinical observation Functional Limitation: Self care Self Care Current Status (Z6109): At least 60 percent but less than 80 percent impaired, limited or restricted Self Care Goal Status (U0454): At least 20 percent but less than 40 percent impaired, limited or restricted   Evette Georges [phone] 09/18/2012, 1:15 PM

## 2012-09-19 DIAGNOSIS — F0390 Unspecified dementia without behavioral disturbance: Secondary | ICD-10-CM

## 2012-09-19 LAB — URINE CULTURE: Colony Count: >=100000

## 2012-09-19 LAB — BASIC METABOLIC PANEL
BUN: 18 mg/dL (ref 6–23)
Calcium: 8.9 mg/dL (ref 8.4–10.5)
Creatinine, Ser: 1.03 mg/dL (ref 0.50–1.35)
GFR calc Af Amer: 74 mL/min — ABNORMAL LOW (ref >90)
GFR calc non Af Amer: 64 mL/min — ABNORMAL LOW (ref >90)
Glucose, Bld: 132 mg/dL — ABNORMAL HIGH (ref 70–99)
Potassium: 4.3 mEq/L (ref 3.5–5.1)

## 2012-09-19 LAB — GLUCOSE, CAPILLARY
Glucose-Capillary: 116 mg/dL — ABNORMAL HIGH (ref 70–99)
Glucose-Capillary: 150 mg/dL — ABNORMAL HIGH (ref 70–99)

## 2012-09-19 LAB — CBC
Hemoglobin: 10.1 g/dL — ABNORMAL LOW (ref 13.0–17.0)
MCH: 31.2 pg (ref 26.0–34.0)
MCHC: 33.1 g/dL (ref 30.0–36.0)
Platelets: 199 10*3/uL (ref 150–400)
RDW: 13.1 % (ref 11.5–15.5)

## 2012-09-19 MED ORDER — LEVOFLOXACIN IN D5W 500 MG/100ML IV SOLN
500.0000 mg | INTRAVENOUS | Status: DC
  Filled 2012-09-19: qty 100

## 2012-09-19 MED ORDER — LEVOFLOXACIN 500 MG PO TABS
500.0000 mg | ORAL_TABLET | Freq: Every day | ORAL | Status: DC
  Administered 2012-09-19: 500 mg via ORAL
  Filled 2012-09-19 (×2): qty 1

## 2012-09-19 NOTE — Progress Notes (Signed)
IV team attempted per policy to restart IV. Unable to obtain access. Hospitalist notified.

## 2012-09-19 NOTE — Progress Notes (Signed)
Hospitalist states to leave IV out until morning rounds.

## 2012-09-19 NOTE — Progress Notes (Signed)
Occupational Therapy Treatment Patient Details Name: Albert Nelson MRN: [id number] DOB: 1923/04/03 Today's Date: 09/19/2012 Time: 7829-5621 OT Time Calculation (min): 45 min  OT Assessment / Plan / Recommendation  OT comments  Patient willing to participate and progressing toward OT goals.  Patient engaged in bed mobility sitting balance, activity tolerance, sit><stand 2 times, lateral side steps with RW, scoot EOB while seated, grooming and some LB bathing. Patient declined to ambulate to sink for grooming tasks this am.  Patient very appreciative of OT services.   Follow Up Recommendations  Home health OT    Frequency Min 2X/week   Progress towards OT Goals Progress towards OT goals: Progressing toward goals  Plan Discharge plan remains appropriate;Frequency remains appropriate    Precautions / Restrictions Precautions Precautions: Fall Restrictions Weight Bearing Restrictions: No   Pertinent Vitals/Pain PAin in right groin and when urinates, not rated, RN aware, rest, repositioned, see Doc Flowsheet for details.    ADL  Grooming: Performed;Wash/dry hands;Wash/dry face;Set up Where Assessed - Grooming: Unsupported sitting Lower Body Bathing: Performed;Moderate assistance Where Assessed - Lower Body Bathing: Supported standing;Supported sit to stand ADL Comments: declined ambulate with RW to sink to perform grooming and bathing tasks.     OT Goals(current goals can now be found in the care plan section)    Visit Information  Last OT Received On: 09/19/12    Cognition  Cognition Arousal/Alertness: Awake/alert Behavior During Therapy: [facility] for tasks assessed/performed;Anxious Area of Impairment: Orientation;Memory;Safety/judgement;Problem solving Memory: Decreased short-term memory Safety/Judgement: Decreased awareness of deficits;Decreased awareness of safety Problem Solving: Slow processing (especially with bed mobility and following verbal cues) General Comments: Pt  repeatedly stating need to urinate despite foley and education for such, patient requested this OT to call his wife.  He was able to recall her phone number and he spoke with her when phone was handed to him.    Mobility  Bed Mobility Bed Mobility: Rolling Left;Left Sidelying to Sit;Sit to Sidelying Left;Scooting to [facility] Rolling Left: 4: Min assist (without rail) Left Sidelying to Sit: 3: Mod assist;HOB flat Sit to Sidelying Left: 2: Max assist;HOB flat (no bed rail) Scooting to HOB: 3: Mod assist;With rail Details for Bed Mobility Assistance: cueing and assist to bend knees, roll left, bring legs to EOB and elevate trunk.  Slow to process cues and difficulty with sequencing to push into bed to elevate from surface without max multimodal cues. Transfers Sit to Stand: 4: Min assist;From bed;From elevated surface;With upper extremity assist Stand to Sit: 4: Min assist;To bed;With upper extremity assist Details for Transfer Assistance: max cueing for hand placement.  Patient declined to ambulate to sink or to transfer to recliner this am.  Patient agreed to transfer to recliner later today if PT comes in to see him.    Balance Balance Balance Assessed: Yes Dynamic Standing Balance Dynamic Standing - Balance Support: Left upper extremity supported Dynamic Standing - Level of Assistance: 4: Min assist Dynamic Standing - Balance Activities: Lateral lean/weight shifting Dynamic Standing - Comments: during LB bath to wash buttocks.   End of Session OT - End of Session Equipment Utilized During Treatment: Rolling walker Activity Tolerance: Patient limited by pain Patient left: in bed;with call bell/phone within reach;with bed alarm set Nurse Communication: Mobility status;Patient requests pain meds  GO     Alayza Pieper 09/19/2012, 10:37 AM

## 2012-09-19 NOTE — Progress Notes (Signed)
[facility] PROGRESS NOTE  Albert Nelson MVH:[id number] DOB: [birth year] DOA: 09/17/2012 PCP: Laurena Slimmer, MD  HPI/Subjective: Feels okay, denies any fever or chills. Was confused last night, he pulled off his cardiac monitor wires repeatedly.  Assessment/Plan:  Syncope:  -Likely secondary to dehydration and active UTI  -Hold diuretics for now, hydrate aggressively with IV fluids. -Currently denies any dizziness  UTI:  -Reports PCN allergy. -Urine culture showed GNR and Streptococcus viridans. -Blood culture obtained, because of Streptococcus viridans, antibiotic switched to Levaquin.  Acute renal failure -Secondary to dehydration volume depletion. -Presented with creatinine of 1.5, creatinine is normal today. This is resolved.  Diabetes:  - Will continue the patient on SSI  - hold oral meds for now  - Diabetic diet   CAD:  - Stable. Asymptomatic  - Cont tele   Back pain:  -Cont pain meds as tolerated -Reported more chest pain to the left side, I will get a rib x-ray to rule out any fractures from fall.   Code Status: Full code Family Communication: Plan discussed with the patient Disposition Plan: Remain inpatient   Consultants:  None  Procedures:  None  Antibiotics:  Ciprofloxacin started on 7/14  Objective: Filed Vitals:   09/17/12 2136 09/18/12 0547 09/18/12 2159 09/19/12 0532  BP: 146/65 115/63 121/69 110/63  Pulse: 65 59 67 65  Temp: 97.7 F (36.5 C) 97.8 F (36.6 C) 99 F (37.2 C) 98.6 F (37 C)  TempSrc: Oral Oral Oral Oral  Resp: 18 18 22 18   Height:      Weight:      SpO2: 100% 100% 98% 98%    Intake/Output Summary (Last 24 hours) at 09/19/12 1102 Last data filed at 09/19/12 8413  Gross per 24 hour  Intake 2110.16 ml  Output   1650 ml  Net 460.16 ml   Filed Weights   09/17/12 1827  Weight: 89.6 kg (197 lb 8.5 oz)    Exam: General: Alert and awake, oriented x3, not in any acute distress. HEENT: anicteric sclera,  pupils reactive to light and accommodation, EOMI CVS: S1-S2 clear, no murmur rubs or gallops Chest: clear to auscultation bilaterally, no wheezing, rales or rhonchi Abdomen: soft nontender, nondistended, normal bowel sounds, no organomegaly Extremities: no cyanosis, clubbing or edema noted bilaterally Neuro: Cranial nerves II-XII intact, no focal neurological deficits   Data Reviewed: Basic Metabolic Panel:  Recent Labs Lab 09/17/12 1431 09/17/12 1443 09/17/12 1714 09/18/12 0500 09/19/12 0425  NA 136 141  --  138 137  K 5.5* 5.5*  --  4.3 4.3  CL 103 108  --  108 105  CO2 21  --   --  23 24  GLUCOSE 223* 226*  --  76 132*  BUN 27* 27*  --  21 18  CREATININE 1.44* 1.50* 1.31 1.12 1.03  CALCIUM 9.7  --   --  8.9 8.9  MG 2.1  --   --   --   --    Liver Function Tests:  Recent Labs Lab 09/17/12 1431 09/18/12 0500  AST 14 11  ALT 8 7  ALKPHOS 81 59  BILITOT 0.4 0.5  PROT 7.6 6.4  ALBUMIN 3.7 3.0*   No results found for this basename: LIPASE, AMYLASE,  in the last 168 hours No results found for this basename: AMMONIA,  in the last 168 hours CBC:  Recent Labs Lab 09/17/12 1431 09/17/12 1443 09/17/12 1714 09/18/12 0500 09/19/12 0425  WBC 8.9  --  14.7* 11.3*  10.4  NEUTROABS 5.8  --   --   --   --   HGB 11.8* 12.6* 10.8* 10.3* 10.1*  HCT 34.8* 37.0* 32.5* 31.2* 30.5*  MCV 93.5  --  94.2 94.0 94.1  PLT 240  --  222 209 199   Cardiac Enzymes:  Recent Labs Lab 09/17/12 1431 09/17/12 1742 09/17/12 2312 09/18/12 0500  CKTOTAL 50  --   --   --   TROPONINI  --  <0.30 <0.30 <0.30   BNP (last 3 results) No results found for this basename: PROBNP,  in the last 8760 hours CBG:  Recent Labs Lab 09/18/12 0733 09/18/12 1146 09/18/12 1711 09/18/12 2204 09/19/12 0730  GLUCAP 82 93 153* 158* 116*    Recent Results (from the past 240 hour(s))  URINE CULTURE     Status: None   Collection Time    09/17/12  2:44 PM      Result Value Range Status   Specimen  Description URINE, CATHETERIZED   Final   Special Requests NONE   Final   Culture  Setup Time 09/17/2012 22:33   Final   Colony Count >=100,000 COLONIES/ML   Final   Culture     Final   Value: GRAM NEGATIVE RODS     VIRIDANS STREPTOCOCCUS   Report Status PENDING   Incomplete     Studies: Dg Chest 2 View  09/17/2012   *RADIOLOGY REPORT*  Clinical Data: Chest pain and fever  CHEST - 2 VIEW  Comparison: None  Findings: Prior open heart surgery.  Negative for heart failure. Negative for pneumonia or effusion.  Lungs are clear.  IMPRESSION: No active cardiopulmonary abnormality.   Original Report Authenticated By: Janeece Riggers, M.D.   Dg Ribs Unilateral Left  09/19/2012   *RADIOLOGY REPORT*  Clinical Data: Left rib pain.  Rule out fracture.  LEFT RIBS - 2 VIEW  Comparison: Chest radiograph 09/17/2012  Findings: No evidence for a displaced left rib fracture.  Median sternotomy wires are present.  No evidence for a left pneumothorax.  IMPRESSION: No evidence for displaced left rib fractures.   Original Report Authenticated By: Richarda Overlie, M.D.   Ct Head Wo Contrast  09/17/2012   *RADIOLOGY REPORT*  Clinical Data: Elderly patient passed out.  Elevated blood sugar.  CT HEAD WITHOUT CONTRAST  Technique:  Contiguous axial images were obtained from the base of the skull through the vertex without contrast.  Comparison: None.  Findings: No skull fracture or intracranial hemorrhage.  Atrophy.  Superimposed hydrocephalus suspected.  Small vessel disease type changes without CT evidence of large acute infarct.  No intracranial mass lesion detected on this unenhanced exam.  Vascular calcifications.  Mastoid air cells and middle ear cavities as well as paranasal sinuses are clear.  Orbital structures unremarkable.  IMPRESSION: No skull fracture or intracranial hemorrhage.  Atrophy.  Superimposed hydrocephalus suspected.  Small vessel disease type changes without CT evidence of large acute infarct.   Original Report  Authenticated By: Lacy Duverney, M.D.    Scheduled Meds: . aspirin EC  81 mg Oral Daily  . atorvastatin  10 mg Oral q1800  . beta carotene w/minerals  1 tablet Oral Daily  . docusate sodium  100 mg Oral BID  . dutasteride  0.5 mg Oral Daily  . enoxaparin (LOVENOX) injection  40 mg Subcutaneous Q24H  . insulin aspart  0-15 Units Subcutaneous TID WC  . insulin aspart  0-5 Units Subcutaneous QHS  . irbesartan  150 mg Oral Daily  .  levofloxacin  500 mg Oral Q2000  . omega-3 acid ethyl esters  1 g Oral Daily  . pantoprazole  20 mg Oral Daily  . sodium chloride  3 mL Intravenous Q12H   Continuous Infusions: . sodium chloride Stopped (09/19/12 0400)    Principal Problem:   Syncope and collapse Active Problems:   UTI (lower urinary tract infection)   Diabetes mellitus   CAD (coronary artery disease)   Back pain    Time spent: 35 minutes    [facility] A  [facility] Pager [phone]. If 7PM-7AM, please contact night-coverage at [email or web address], password [facility] 09/19/2012, 11:02 AM  LOS: 2 days

## 2012-09-19 NOTE — Progress Notes (Signed)
Physical Therapy Treatment Patient Details Name: Albert Nelson MRN: [id number] DOB: 01-16-1924 Today's Date: 09/19/2012 Time: 3474-2595 PT Time Calculation (min): 27 min  PT Assessment / Plan / Recommendation  PT Comments   Patient limited due to chronic pain in back and down left leg.  Wife states has a cyst on spine.  Slow to process information, but able to assist self about 75-80% for mobility.  Will continue skilled PT in acute setting to allow d/c home with assist and HHPT.  Follow Up Recommendations  Supervision/Assistance - 24 hour;Home health PT     Does the patient have the potential to tolerate intense rehabilitation   N/A  Barriers to Discharge  None      Equipment Recommendations  None recommended by PT    Recommendations for Other Services  None  Frequency Min 3X/week   Progress towards PT Goals Progress towards PT goals: Progressing toward goals  Plan Current plan remains appropriate    Precautions / Restrictions Precautions Precautions: Fall Restrictions Weight Bearing Restrictions: No   Pertinent Vitals/Pain Lower back and down left leg pain 8/10 with ambulation    Mobility  Bed Mobility Bed Mobility: Sitting - Scoot to Edge of Bed Rolling Left: 4: Min assist;With rail Left Sidelying to Sit: 4: Min assist;With rails;HOB elevated Sitting - Scoot to [place] of Bed: 4: Min assist Sit to Sidelying Left: 2: Max assist;HOB flat (no bed rail) Scooting to HOB: 3: Mod assist;With rail Details for Bed Mobility Assistance: slow movements and mod cues for technique Transfers Sit to Stand: 4: Min assist;With upper extremity assist Stand to Sit: 4: Min assist;With armrests;To chair/3-in-1 Details for Transfer Assistance: max cues for hand placement and assist with getting center of gravity over base of support Ambulation/Gait Ambulation/Gait Assistance: 3: Mod assist;4: Min assist Ambulation Distance (Feet): 20 Feet Assistive device: Rolling walker Ambulation/Gait  Assistance Details: multiple stops to scratch back; cues for posture, assist to unweight left LE due to pain Gait Pattern: Trunk flexed;Shuffle;Step-to pattern Gait velocity: very slow with stoppage time about 5-6 minutes to walk 20 feet    Exercises General Exercises - Lower Extremity Ankle Circles/Pumps: AROM;Both;10 reps;Seated Long Arc Quad: AROM;Both;10 reps;Seated Hip Flexion/Marching: AROM;Both;10 reps;Seated   :     PT Goals (current goals can now be found in the care plan section)    Visit Information  Last PT Received On: 09/19/12    Subjective Data      Cognition  Cognition Arousal/Alertness: Awake/alert Behavior During Therapy: [facility] for tasks assessed/performed;Anxious Overall Cognitive Status: Impaired/Different from baseline Area of Impairment: Orientation;Memory;Safety/judgement;Problem solving Orientation Level: Time;Situation;Place Memory: Decreased short-term memory Safety/Judgement: Decreased awareness of deficits;Decreased awareness of safety Problem Solving: Slow processing General Comments: Pt repeatedly stating need to urinate despite foley and education for such, patient requested this OT to call his wife.  He was able to recall her phone number and he spoke with her when phone was handed to him.    Balance  Balance Balance Assessed: Yes Dynamic Standing Balance Dynamic Standing - Balance Support: Left upper extremity supported Dynamic Standing - Level of Assistance: 4: Min assist Dynamic Standing - Balance Activities: Lateral lean/weight shifting Dynamic Standing - Comments: during LB bath to wash buttocks.  End of Session PT - End of Session Equipment Utilized During Treatment: Gait belt Activity Tolerance: Patient limited by pain;Patient limited by fatigue Patient left: in chair;with call bell/phone within reach;with chair alarm set;with family/visitor present   GP     [facility] 09/19/2012, 1:26 PM  [place], [place] [phone] 09/19/2012

## 2012-09-19 NOTE — Progress Notes (Addendum)
Patient has pulled out 2 IV sites during Saturday night. Restarted IV. Patient pulled out IV on Sunday night shift. 2 RNs attempted restart x1 each. IV team restuck. IV restarted. Patient within 2 hour time frame pulled IV out again. Programmer, applications notified. Hospitalist states to attempt to restart IV for Cipro admin one more time by IV team, and to have IV team help troubleshoot. If patient pulls out 3rd time, will call her back to evaluate changing to PO. Will continue to monitor.

## 2012-09-19 NOTE — Progress Notes (Signed)
Utilization review completed.  

## 2012-09-20 DIAGNOSIS — I369 Nonrheumatic tricuspid valve disorder, unspecified: Secondary | ICD-10-CM

## 2012-09-20 DIAGNOSIS — I2510 Atherosclerotic heart disease of native coronary artery without angina pectoris: Secondary | ICD-10-CM

## 2012-09-20 DIAGNOSIS — R55 Syncope and collapse: Principal | ICD-10-CM

## 2012-09-20 LAB — GLUCOSE, CAPILLARY: Glucose-Capillary: 163 mg/dL — ABNORMAL HIGH (ref 70–99)

## 2012-09-20 LAB — CBC
HCT: 31.6 % — ABNORMAL LOW (ref 39.0–52.0)
MCH: 31.6 pg (ref 26.0–34.0)
MCV: 93.2 fL (ref 78.0–100.0)
RBC: 3.39 MIL/uL — ABNORMAL LOW (ref 4.22–5.81)
RDW: 13 % (ref 11.5–15.5)
WBC: 10.6 10*3/uL — ABNORMAL HIGH (ref 4.0–10.5)

## 2012-09-20 LAB — BASIC METABOLIC PANEL
CO2: 26 mEq/L (ref 19–32)
Chloride: 103 mEq/L (ref 96–112)
Creatinine, Ser: 1.13 mg/dL (ref 0.50–1.35)
Glucose, Bld: 121 mg/dL — ABNORMAL HIGH (ref 70–99)

## 2012-09-20 MED ORDER — LEVOFLOXACIN 250 MG PO TABS
250.0000 mg | ORAL_TABLET | Freq: Every day | ORAL | Status: DC
  Administered 2012-09-20 – 2012-09-21 (×2): 250 mg via ORAL
  Filled 2012-09-20 (×3): qty 1

## 2012-09-20 MED ORDER — SULFAMETHOXAZOLE-TMP DS 800-160 MG PO TABS
1.0000 | ORAL_TABLET | Freq: Two times a day (BID) | ORAL | Status: DC
  Administered 2012-09-20 – 2012-09-22 (×5): 1 via ORAL
  Filled 2012-09-20 (×6): qty 1

## 2012-09-20 NOTE — Progress Notes (Signed)
PATIENT DETAILS Name: Albert Nelson Age: 79 y.o. Sex: male Date of Birth: 1924/01/01 Admit Date: 09/17/2012 Admitting Physician Jerald Kief, MD WJX:[id number],GNFAOZH S, MD  Subjective: No major complaints  Assessment/Plan: Syncope:  -Likely secondary to dehydration and active UTI  -Hold diuretics for now,was given IVF-will stop today -Currently denies any dizziness  -Tele-unremarkable -Echo-poor study-but likely normal EF -CT head on admit-neg for acute abnormalities  UTI:  -Reports PCN allergy.  -Urine culture showed E Coli and Streptococcus viridans.  -Blood culture obtained on 7/15-pending -Changed antibiotics to Bactrim and Levaquin.   Acute renal failure  -Secondary to dehydration volume depletion.  -Presented with creatinine of 1.5, creatinine is normal today. This is resolved.   Diabetes:  - Will continue the patient on SSI  - hold oral meds for now  - Diabetic diet   CAD:  - Stable. Asymptomatic  - Cont tele   Back pain:  -Cont pain meds as tolerated  -Reported more chest pain to the left side,  rib x-ray was obtained-which was neg for fracture  Deconditioning -PT eval-suspect needs SNF on discharge  Disposition: Remain inpatient  DVT Prophylaxis: Prophylactic Lovenox or Heparin or SCD's  Code Status: DNR  Family Communication None at bedside  Procedures:  None  CONSULTS:  None   MEDICATIONS: Scheduled Meds: . aspirin EC  81 mg Oral Daily  . atorvastatin  10 mg Oral q1800  . beta carotene w/minerals  1 tablet Oral Daily  . docusate sodium  100 mg Oral BID  . dutasteride  0.5 mg Oral Daily  . enoxaparin (LOVENOX) injection  40 mg Subcutaneous Q24H  . insulin aspart  0-15 Units Subcutaneous TID WC  . insulin aspart  0-5 Units Subcutaneous QHS  . irbesartan  150 mg Oral Daily  . levofloxacin  250 mg Oral Q2000  . omega-3 acid ethyl esters  1 g Oral Daily  . pantoprazole  20 mg Oral Daily  . sodium chloride  3 mL Intravenous Q12H  .  sulfamethoxazole-trimethoprim  1 tablet Oral Q12H   Continuous Infusions: . sodium chloride Stopped (09/19/12 0400)   PRN Meds:.acetaminophen, acetaminophen, bisacodyl, HYDROcodone-acetaminophen, HYDROmorphone (DILAUDID) injection, polyethylene glycol  Antibiotics: Anti-infectives   Start     Dose/Rate Route Frequency Ordered Stop   09/20/12 2000  levofloxacin (LEVAQUIN) tablet 250 mg     250 mg Oral Daily 09/20/12 1023 09/26/12 1959   09/20/12 1100  sulfamethoxazole-trimethoprim (BACTRIM DS) 800-160 MG per tablet 1 tablet     1 tablet Oral Every 12 hours 09/20/12 1023 09/27/12 0959   09/19/12 2000  levofloxacin (LEVAQUIN) tablet 500 mg  Status:  Discontinued     500 mg Oral Daily 09/19/12 1059 09/20/12 1023   09/19/12 1100  levofloxacin (LEVAQUIN) IVPB 500 mg  Status:  Discontinued     500 mg 100 mL/hr over 60 Minutes Intravenous Every 24 hours 09/19/12 1005 09/19/12 1059   09/18/12 1500  ciprofloxacin (CIPRO) IVPB 400 mg  Status:  Discontinued     400 mg 200 mL/hr over 60 Minutes Intravenous Every 12 hours 09/18/12 1351 09/19/12 1005   09/17/12 1630  cefTRIAXone (ROCEPHIN) 1 g in dextrose 5 % 50 mL IVPB  Status:  Discontinued     1 g 100 mL/hr over 30 Minutes Intravenous  Once 09/17/12 1615 09/17/12 1739       PHYSICAL EXAM: Vital signs in last 24 hours: Filed Vitals:   09/19/12 0532 09/19/12 1441 09/19/12 2127 09/20/12 0501  BP: 110/63 118/68 128/68 120/76  Pulse: 65 64 70 65  Temp: 98.6 F (37 C) 98.4 F (36.9 C) 99.3 F (37.4 C) 99 F (37.2 C)  TempSrc: Oral Oral Oral Oral  Resp: 18 18 26 18   Height:      Weight:      SpO2: 98% 98% 97% 97%    Weight change:  Filed Weights   09/17/12 1827  Weight: 89.6 kg (197 lb 8.5 oz)   Body mass index is 31.9 kg/(m^2).   Gen Exam: Awake and alert with clear speech.   Neck: Supple, No JVD.   Chest: B/L Clear.   CVS: S1 S2 Regular, no murmurs.  Abdomen: soft, BS +, non tender, non distended.  Extremities: no edema,  lower extremities warm to touch. Neurologic: Non Focal.   Skin: No Rash.   Wounds: N/A.    Intake/Output from previous day:  Intake/Output Summary (Last 24 hours) at 09/20/12 1257 Last data filed at 09/20/12 0600  Gross per 24 hour  Intake     60 ml  Output   1000 ml  Net   -940 ml     LAB RESULTS: CBC  Recent Labs Lab 09/17/12 1431 09/17/12 1443 09/17/12 1714 09/18/12 0500 09/19/12 0425 09/20/12 0708  WBC 8.9  --  14.7* 11.3* 10.4 10.6*  HGB 11.8* 12.6* 10.8* 10.3* 10.1* 10.7*  HCT 34.8* 37.0* 32.5* 31.2* 30.5* 31.6*  PLT 240  --  222 209 199 209  MCV 93.5  --  94.2 94.0 94.1 93.2  MCH 31.7  --  31.3 31.0 31.2 31.6  MCHC 33.9  --  33.2 33.0 33.1 33.9  RDW 13.0  --  13.2 13.3 13.1 13.0  LYMPHSABS 2.0  --   --   --   --   --   MONOABS 0.9  --   --   --   --   --   EOSABS 0.2  --   --   --   --   --   BASOSABS 0.0  --   --   --   --   --     Chemistries   Recent Labs Lab 09/17/12 1431 09/17/12 1443 09/17/12 1714 09/18/12 0500 09/19/12 0425 09/20/12 0708  NA 136 141  --  138 137 138  K 5.5* 5.5*  --  4.3 4.3 4.0  CL 103 108  --  108 105 103  CO2 21  --   --  23 24 26   GLUCOSE 223* 226*  --  76 132* 121*  BUN 27* 27*  --  21 18 17   CREATININE 1.44* 1.50* 1.31 1.12 1.03 1.13  CALCIUM 9.7  --   --  8.9 8.9 9.4  MG 2.1  --   --   --   --   --     CBG:  Recent Labs Lab 09/19/12 1139 09/19/12 1708 09/19/12 2132 09/20/12 0801 09/20/12 1203  GLUCAP 150* 170* 138* 125* 163*    GFR Estimated Creatinine Clearance: 49.2 ml/min (by C-G formula based on Cr of 1.13).  Coagulation profile No results found for this basename: INR, PROTIME,  in the last 168 hours  Cardiac Enzymes  Recent Labs Lab 09/17/12 1742 09/17/12 2312 09/18/12 0500  TROPONINI <0.30 <0.30 <0.30    No components found with this basename: POCBNP,  No results found for this basename: DDIMER,  in the last 72 hours No results found for this basename: HGBA1C,  in the last 72  hours No results found for this  basename: CHOL, HDL, LDLCALC, TRIG, CHOLHDL, LDLDIRECT,  in the last 72 hours No results found for this basename: TSH, T4TOTAL, FREET3, T3FREE, THYROIDAB,  in the last 72 hours No results found for this basename: VITAMINB12, FOLATE, FERRITIN, TIBC, IRON, RETICCTPCT,  in the last 72 hours No results found for this basename: LIPASE, AMYLASE,  in the last 72 hours  Urine Studies No results found for this basename: UACOL, UAPR, USPG, UPH, UTP, UGL, UKET, UBIL, UHGB, UNIT, UROB, ULEU, UEPI, UWBC, URBC, UBAC, CAST, CRYS, UCOM, BILUA,  in the last 72 hours  MICROBIOLOGY: Recent Results (from the past 240 hour(s))  URINE CULTURE     Status: None   Collection Time    09/17/12  2:44 PM      Result Value Range Status   Specimen Description URINE, CATHETERIZED   Final   Special Requests NONE   Final   Culture  Setup Time 09/17/2012 22:33   Final   Colony Count >=100,000 COLONIES/ML   Final   Culture     Final   Value: ESCHERICHIA COLI     VIRIDANS STREPTOCOCCUS   Report Status 09/19/2012 FINAL   Final   Organism ID, Bacteria ESCHERICHIA COLI   Final  CULTURE, BLOOD (ROUTINE X 2)     Status: None   Collection Time    09/19/12  9:40 AM      Result Value Range Status   Specimen Description BLOOD RIGHT ANTECUBITAL   Final   Special Requests BOTTLES DRAWN AEROBIC ONLY 10CC   Final   Culture  Setup Time 09/19/2012 12:59   Final   Culture     Final   Value:        BLOOD CULTURE RECEIVED NO GROWTH TO DATE CULTURE WILL BE HELD FOR 5 DAYS BEFORE ISSUING A FINAL NEGATIVE REPORT   Report Status PENDING   Incomplete  CULTURE, BLOOD (ROUTINE X 2)     Status: None   Collection Time    09/19/12  9:45 AM      Result Value Range Status   Specimen Description BLOOD HAND RIGHT   Final   Special Requests BOTTLES DRAWN AEROBIC ONLY 10CC   Final   Culture  Setup Time 09/19/2012 12:59   Final   Culture     Final   Value:        BLOOD CULTURE RECEIVED NO GROWTH TO DATE CULTURE  WILL BE HELD FOR 5 DAYS BEFORE ISSUING A FINAL NEGATIVE REPORT   Report Status PENDING   Incomplete    RADIOLOGY STUDIES/RESULTS: Dg Chest 2 View  09/17/2012   *RADIOLOGY REPORT*  Clinical Data: Chest pain and fever  CHEST - 2 VIEW  Comparison: None  Findings: Prior open heart surgery.  Negative for heart failure. Negative for pneumonia or effusion.  Lungs are clear.  IMPRESSION: No active cardiopulmonary abnormality.   Original Report Authenticated By: Janeece Riggers, M.D.   Dg Ribs Unilateral Left  09/19/2012   *RADIOLOGY REPORT*  Clinical Data: Left rib pain.  Rule out fracture.  LEFT RIBS - 2 VIEW  Comparison: Chest radiograph 09/17/2012  Findings: No evidence for a displaced left rib fracture.  Median sternotomy wires are present.  No evidence for a left pneumothorax.  IMPRESSION: No evidence for displaced left rib fractures.   Original Report Authenticated By: Richarda Overlie, M.D.   Ct Head Wo Contrast  09/17/2012   *RADIOLOGY REPORT*  Clinical Data: Elderly patient passed out.  Elevated blood sugar.  CT HEAD WITHOUT CONTRAST  Technique:  Contiguous axial images were obtained from the base of the skull through the vertex without contrast.  Comparison: None.  Findings: No skull fracture or intracranial hemorrhage.  Atrophy.  Superimposed hydrocephalus suspected.  Small vessel disease type changes without CT evidence of large acute infarct.  No intracranial mass lesion detected on this unenhanced exam.  Vascular calcifications.  Mastoid air cells and middle ear cavities as well as paranasal sinuses are clear.  Orbital structures unremarkable.  IMPRESSION: No skull fracture or intracranial hemorrhage.  Atrophy.  Superimposed hydrocephalus suspected.  Small vessel disease type changes without CT evidence of large acute infarct.   Original Report Authenticated By: Lacy Duverney, M.D.    Jeoffrey Massed, MD  [facility] Pager:[phone]  If 7PM-7AM, please contact  night-coverage [email or web address] Password [email or web address] 09/20/2012, 12:57 PM   LOS: 3 days

## 2012-09-20 NOTE — Progress Notes (Signed)
Clinical Social Work Department BRIEF PSYCHOSOCIAL ASSESSMENT 09/20/2012  Patient:  Albert Nelson, Albert Nelson     Account Number:  [id number]     Admit date:  09/17/2012  Clinical Social Worker:  Lavell Luster  Date/Time:  09/20/2012 10:56 AM  Referred by:  Physician  Date Referred:  09/20/2012 Referred for  Other - See comment   Other Referral:   CSW needed to contact patient's wife to determine whether the patient should return home or be discharged to a nursing facility. Patient's wife states that she feels comfortable caring for him at home.   Interview type:  Other - See comment Other interview type:   Spoke with patient's wife over the phone, as patient is currently only oriented to self.    PSYCHOSOCIAL DATA Living Status:  WIFE Admitted from facility:   Level of care:   Primary support name:  Albert Nelson Primary support relationship to patient:  SPOUSE Degree of support available:   Strong and very supportive    CURRENT CONCERNS Current Concerns  None Noted   Other Concerns:    SOCIAL WORK ASSESSMENT / PLAN CSW spoke with patient's wife to determine if she would be comfortable taking care of the patient at home after discharge. Patient's wife stated that she can manage the responsibilities of taking care of the patient in home. Patient's wife stated that the patient participates in an adult daycare program Monday-Friday at [facility]). Patients wife was informed that if patient continues to go to the adult daycare during the day, the patient will not be eligible for home health. Patient's wife stated that the patient has a walker and elevated toilet seat, and stated that their home is not wheel chair equiped. Notified RN case Film/video editor of above.   Assessment/plan status:  No Further Intervention Required Other assessment/ plan:   Information/referral to community resources:   NA    PATIENTS/FAMILYS RESPONSE TO PLAN OF  CARE: Patient's wife was pleasant and looking forward to patient's discharge. Patient's wife feels confident in being able to manage the responisibilities of caring for patient at home. No further CSW needs identified. CSW signing off. Albert Nelson, [place] [phone]
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
## 2012-09-20 NOTE — Progress Notes (Signed)
  Echocardiogram 2D Echocardiogram has been performed.  Albert Nelson 09/20/2012, 11:18 AM

## 2012-09-20 NOTE — Progress Notes (Signed)
Wife at beside, moved patient out of bed to chair for meal and wife noted it was 2 person assist. Patient wife is now agreeable to SNF notified SW.

## 2012-09-21 MED ORDER — DARIFENACIN HYDROBROMIDE ER 15 MG PO TB24
15.0000 mg | ORAL_TABLET | Freq: Every day | ORAL | Status: DC
  Administered 2012-09-21 – 2012-09-22 (×2): 15 mg via ORAL
  Filled 2012-09-21 (×2): qty 1

## 2012-09-21 MED ORDER — ALLOPURINOL 100 MG PO TABS
100.0000 mg | ORAL_TABLET | Freq: Every day | ORAL | Status: DC
  Administered 2012-09-21 – 2012-09-22 (×2): 100 mg via ORAL
  Filled 2012-09-21 (×2): qty 1

## 2012-09-21 MED ORDER — NABUMETONE 500 MG PO TABS
500.0000 mg | ORAL_TABLET | Freq: Two times a day (BID) | ORAL | Status: DC
  Administered 2012-09-21 – 2012-09-22 (×3): 500 mg via ORAL
  Filled 2012-09-21 (×4): qty 1

## 2012-09-21 MED ORDER — ACETAMINOPHEN 500 MG PO TABS
1000.0000 mg | ORAL_TABLET | Freq: Three times a day (TID) | ORAL | Status: DC
  Administered 2012-09-21 – 2012-09-22 (×4): 1000 mg via ORAL
  Filled 2012-09-21 (×6): qty 2
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
## 2012-09-21 NOTE — Progress Notes (Signed)
PATIENT DETAILS Name: Albert Nelson Age: 79 y.o. Sex: male Date of Birth: 03-12-1923 Admit Date: 09/17/2012 Admitting Physician Jerald Kief, MD ZOX:[id number], MD  Subjective: No major complaints-except for low back pain which is a chronic issue.  Assessment/Plan: Syncope:  -Likely secondary to dehydration and active UTI  -Hold diuretics for now,was given IVF-will stop today -Currently denies any dizziness  -Tele-unremarkable -Echo-poor study-but likely normal EF -CT head on admit-neg for acute abnormalities  UTI:  -Reports PCN allergy.  -Urine culture showed E Coli and Streptococcus viridans.  -Blood culture obtained on 7/15-pending -Changed antibiotics to Bactrim and Levaquin.   Acute renal failure  -Secondary to dehydration volume depletion.  -Presented with creatinine of 1.5, creatinine is normal today. This has resolved.   Diabetes:  - Will continue the patient on SSI  - hold oral meds for now  - Diabetic diet   CAD:  - Stable. Asymptomatic  - Cont tele   HTN -controlled with Avapro  Chronic Back pain:  -Cont pain meds as tolerated  -Reported more chest pain to the left side,  rib x-ray was obtained-which was neg for fracture -spoke with spouse at bedside-he takes Tylenol atleast twice a day-will start scheduled tylenol TID  Deconditioning -PT eval-suspect needs SNF on discharge. Spouse reluctant on 7/16-now agreeable to SNF 7/17  Disposition: Remain inpatient  DVT Prophylaxis: Prophylactic Lovenox   Code Status: DNR  Family Communication Wife over the phone 7/17-she is now agreeable for SNF  Procedures:  None  CONSULTS:  None   MEDICATIONS: Scheduled Meds: . acetaminophen  1,000 mg Oral TID  . aspirin EC  81 mg Oral Daily  . atorvastatin  10 mg Oral q1800  . beta carotene w/minerals  1 tablet Oral Daily  . docusate sodium  100 mg Oral BID  . dutasteride  0.5 mg Oral Daily  . enoxaparin (LOVENOX) injection  40 mg Subcutaneous  Q24H  . insulin aspart  0-15 Units Subcutaneous TID WC  . insulin aspart  0-5 Units Subcutaneous QHS  . irbesartan  150 mg Oral Daily  . levofloxacin  250 mg Oral Q2000  . omega-3 acid ethyl esters  1 g Oral Daily  . pantoprazole  20 mg Oral Daily  . sodium chloride  3 mL Intravenous Q12H  . sulfamethoxazole-trimethoprim  1 tablet Oral Q12H   Continuous Infusions: . sodium chloride Stopped (09/19/12 0400)   PRN Meds:.acetaminophen, acetaminophen, bisacodyl, HYDROcodone-acetaminophen, HYDROmorphone (DILAUDID) injection, polyethylene glycol  Antibiotics: Anti-infectives   Start     Dose/Rate Route Frequency Ordered Stop   09/20/12 2000  levofloxacin (LEVAQUIN) tablet 250 mg     250 mg Oral Daily 09/20/12 1023 09/26/12 1959   09/20/12 1100  sulfamethoxazole-trimethoprim (BACTRIM DS) 800-160 MG per tablet 1 tablet     1 tablet Oral Every 12 hours 09/20/12 1023 09/27/12 0959   09/19/12 2000  levofloxacin (LEVAQUIN) tablet 500 mg  Status:  Discontinued     500 mg Oral Daily 09/19/12 1059 09/20/12 1023   09/19/12 1100  levofloxacin (LEVAQUIN) IVPB 500 mg  Status:  Discontinued     500 mg 100 mL/hr over 60 Minutes Intravenous Every 24 hours 09/19/12 1005 09/19/12 1059   09/18/12 1500  ciprofloxacin (CIPRO) IVPB 400 mg  Status:  Discontinued     400 mg 200 mL/hr over 60 Minutes Intravenous Every 12 hours 09/18/12 1351 09/19/12 1005   09/17/12 1630  cefTRIAXone (ROCEPHIN) 1 g in dextrose 5 % 50 mL IVPB  Status:  Discontinued  1 g 100 mL/hr over 30 Minutes Intravenous  Once 09/17/12 1615 09/17/12 1739       PHYSICAL EXAM: Vital signs in last 24 hours: Filed Vitals:   09/20/12 0501 09/20/12 1300 09/20/12 2127 09/21/12 0622  BP: 120/76 109/67 112/59 135/66  Pulse: 65 67 67 63  Temp: 99 F (37.2 C) 98.2 F (36.8 C) 98.7 F (37.1 C) 97.8 F (36.6 C)  TempSrc: Oral Oral Oral Oral  Resp: 18 18 18 18   Height:      Weight:      SpO2: 97% 98% 98% 97%    Weight change:  Filed  Weights   09/17/12 1827  Weight: 89.6 kg (197 lb 8.5 oz)   Body mass index is 31.9 kg/(m^2).   Gen Exam: Awake and alert with clear speech.   Neck: Supple, No JVD.   Chest: B/L Clear.   CVS: S1 S2 Regular, no murmurs.  Abdomen: soft, BS +, non tender, non distended.  Extremities: no edema, lower extremities warm to touch. Neurologic: Non Focal.   Skin: No Rash.   Wounds: N/A.    Intake/Output from previous day:  Intake/Output Summary (Last 24 hours) at 09/21/12 0846 Last data filed at 09/21/12 0634  Gross per 24 hour  Intake    600 ml  Output    950 ml  Net   -350 ml     LAB RESULTS: CBC  Recent Labs Lab 09/17/12 1431 09/17/12 1443 09/17/12 1714 09/18/12 0500 09/19/12 0425 09/20/12 0708  WBC 8.9  --  14.7* 11.3* 10.4 10.6*  HGB 11.8* 12.6* 10.8* 10.3* 10.1* 10.7*  HCT 34.8* 37.0* 32.5* 31.2* 30.5* 31.6*  PLT 240  --  222 209 199 209  MCV 93.5  --  94.2 94.0 94.1 93.2  MCH 31.7  --  31.3 31.0 31.2 31.6  MCHC 33.9  --  33.2 33.0 33.1 33.9  RDW 13.0  --  13.2 13.3 13.1 13.0  LYMPHSABS 2.0  --   --   --   --   --   MONOABS 0.9  --   --   --   --   --   EOSABS 0.2  --   --   --   --   --   BASOSABS 0.0  --   --   --   --   --     Chemistries   Recent Labs Lab 09/17/12 1431 09/17/12 1443 09/17/12 1714 09/18/12 0500 09/19/12 0425 09/20/12 0708  NA 136 141  --  138 137 138  K 5.5* 5.5*  --  4.3 4.3 4.0  CL 103 108  --  108 105 103  CO2 21  --   --  23 24 26   GLUCOSE 223* 226*  --  76 132* 121*  BUN 27* 27*  --  21 18 17   CREATININE 1.44* 1.50* 1.31 1.12 1.03 1.13  CALCIUM 9.7  --   --  8.9 8.9 9.4  MG 2.1  --   --   --   --   --     CBG:  Recent Labs Lab 09/20/12 0801 09/20/12 1203 09/20/12 1732 09/20/12 2127 09/21/12 0800  GLUCAP 125* 163* 122* 136* 120*    GFR Estimated Creatinine Clearance: 49.2 ml/min (by C-G formula based on Cr of 1.13).  Coagulation profile No results found for this basename: INR, PROTIME,  in the last 168  hours  Cardiac Enzymes  Recent Labs Lab 09/17/12 1742 09/17/12 2312 09/18/12 0500  TROPONINI <0.30 <0.30 <0.30    No components found with this basename: POCBNP,  No results found for this basename: DDIMER,  in the last 72 hours No results found for this basename: HGBA1C,  in the last 72 hours No results found for this basename: CHOL, HDL, LDLCALC, TRIG, CHOLHDL, LDLDIRECT,  in the last 72 hours No results found for this basename: TSH, T4TOTAL, FREET3, T3FREE, THYROIDAB,  in the last 72 hours No results found for this basename: VITAMINB12, FOLATE, FERRITIN, TIBC, IRON, RETICCTPCT,  in the last 72 hours No results found for this basename: LIPASE, AMYLASE,  in the last 72 hours  Urine Studies No results found for this basename: UACOL, UAPR, USPG, UPH, UTP, UGL, UKET, UBIL, UHGB, UNIT, UROB, ULEU, UEPI, UWBC, URBC, UBAC, CAST, CRYS, UCOM, BILUA,  in the last 72 hours  MICROBIOLOGY: Recent Results (from the past 240 hour(s))  URINE CULTURE     Status: None   Collection Time    09/17/12  2:44 PM      Result Value Range Status   Specimen Description URINE, CATHETERIZED   Final   Special Requests NONE   Final   Culture  Setup Time 09/17/2012 22:33   Final   Colony Count >=100,000 COLONIES/ML   Final   Culture     Final   Value: ESCHERICHIA COLI     VIRIDANS STREPTOCOCCUS   Report Status 09/19/2012 FINAL   Final   Organism ID, Bacteria ESCHERICHIA COLI   Final  CULTURE, BLOOD (ROUTINE X 2)     Status: None   Collection Time    09/19/12  9:40 AM      Result Value Range Status   Specimen Description BLOOD RIGHT ANTECUBITAL   Final   Special Requests BOTTLES DRAWN AEROBIC ONLY 10CC   Final   Culture  Setup Time 09/19/2012 12:59   Final   Culture     Final   Value:        BLOOD CULTURE RECEIVED NO GROWTH TO DATE CULTURE WILL BE HELD FOR 5 DAYS BEFORE ISSUING A FINAL NEGATIVE REPORT   Report Status PENDING   Incomplete  CULTURE, BLOOD (ROUTINE X 2)     Status: None   Collection  Time    09/19/12  9:45 AM      Result Value Range Status   Specimen Description BLOOD HAND RIGHT   Final   Special Requests BOTTLES DRAWN AEROBIC ONLY 10CC   Final   Culture  Setup Time 09/19/2012 12:59   Final   Culture     Final   Value:        BLOOD CULTURE RECEIVED NO GROWTH TO DATE CULTURE WILL BE HELD FOR 5 DAYS BEFORE ISSUING A FINAL NEGATIVE REPORT   Report Status PENDING   Incomplete    RADIOLOGY STUDIES/RESULTS: Dg Chest 2 View  09/17/2012   *RADIOLOGY REPORT*  Clinical Data: Chest pain and fever  CHEST - 2 VIEW  Comparison: None  Findings: Prior open heart surgery.  Negative for heart failure. Negative for pneumonia or effusion.  Lungs are clear.  IMPRESSION: No active cardiopulmonary abnormality.   Original Report Authenticated By: Janeece Riggers, M.D.   Dg Ribs Unilateral Left  09/19/2012   *RADIOLOGY REPORT*  Clinical Data: Left rib pain.  Rule out fracture.  LEFT RIBS - 2 VIEW  Comparison: Chest radiograph 09/17/2012  Findings: No evidence for a displaced left rib fracture.  Median sternotomy wires are present.  No evidence for a left pneumothorax.  IMPRESSION: No evidence for displaced left rib fractures.   Original Report Authenticated By: Richarda Overlie, M.D.   Ct Head Wo Contrast  09/17/2012   *RADIOLOGY REPORT*  Clinical Data: Elderly patient passed out.  Elevated blood sugar.  CT HEAD WITHOUT CONTRAST  Technique:  Contiguous axial images were obtained from the base of the skull through the vertex without contrast.  Comparison: None.  Findings: No skull fracture or intracranial hemorrhage.  Atrophy.  Superimposed hydrocephalus suspected.  Small vessel disease type changes without CT evidence of large acute infarct.  No intracranial mass lesion detected on this unenhanced exam.  Vascular calcifications.  Mastoid air cells and middle ear cavities as well as paranasal sinuses are clear.  Orbital structures unremarkable.  IMPRESSION: No skull fracture or intracranial hemorrhage.  Atrophy.   Superimposed hydrocephalus suspected.  Small vessel disease type changes without CT evidence of large acute infarct.   Original Report Authenticated By: Lacy Duverney, M.D.    Jeoffrey Massed, MD  [facility] Pager:[phone]  If 7PM-7AM, please contact night-coverage [email or web address] Password [facility] 09/21/2012, 8:46 AM   LOS: 4 days

## 2012-09-21 NOTE — Progress Notes (Signed)
Physical Therapy Treatment Patient Details Name: Albert Nelson MRN: [id number] DOB: [birth year] Today's Date: 09/21/2012 Time: 6213-0865 PT Time Calculation (min): 35 min  PT Assessment / Plan / Recommendation  PT Comments   Patient able to perform tasks in standing with min assist at sink and progress ambulation slightly.  Still needing increased assist for transfers due to lower body stiffness and back pain.  Will need SNF level rehab at d/c as wife will be unable to provide level of assist needed at d/c.    Follow Up Recommendations  SNF     Does the patient have the potential to tolerate intense rehabilitation   No  Barriers to Discharge  None      Equipment Recommendations  None recommended by PT    Recommendations for Other Services  None  Frequency Min 3X/week   Progress towards PT Goals Progress towards PT goals: Progressing toward goals  Plan Discharge plan needs to be updated    Precautions / Restrictions Precautions Precautions: Fall   Pertinent Vitals/Pain Min c/o back pain with mobility    Mobility  Bed Mobility Rolling Left: 4: Min assist;With rail Left Sidelying to Sit: 4: Min assist;With rails;HOB elevated Sitting - Scoot to Edge of Bed: 4: Min assist Details for Bed Mobility Assistance: increased time for all mobility and assist to sequence with technique Transfers Sit to Stand: From chair/3-in-1;From bed;With upper extremity assist;3: Mod assist Stand to Sit: To chair/3-in-1;4: Min assist;With armrests Details for Transfer Assistance: cues for safety, hand placement, prep with feet placed further back; assist for anterior weight shift Ambulation/Gait Ambulation/Gait Assistance: 4: Min assist Ambulation Distance (Feet): 20 Feet Assistive device: Rolling walker Ambulation/Gait Assistance Details: cues for posture, step length, forward gaze Gait Pattern: Trunk flexed;Shuffle;Step-to pattern    Exercises General Exercises - Lower Extremity Heel Slides:  AAROM;Both;5 reps;Supine     PT Goals (current goals can now be found in the care plan section)    Visit Information  Last PT Received On: 09/21/12 PT/OT Co-Evaluation/Treatment: Yes    Subjective Data   Need to shave   Cognition  Cognition Arousal/Alertness: Awake/alert Behavior During Therapy: Flat affect Overall Cognitive Status: Impaired/Different from baseline Orientation Level: Situation;Time Memory: Decreased short-term memory Safety/Judgement: Decreased awareness of deficits;Decreased awareness of safety Problem Solving: Slow processing    Balance  Dynamic Sitting Balance Dynamic Sitting - Balance Support: Right upper extremity supported;Left upper extremity supported Dynamic Sitting - Level of Assistance: 5: Stand by assistance Dynamic Sitting - Comments: reaching down to wash his legs; alternating UE assist during LB bathing task; only able to reach ankles due to back issues Dynamic Standing Balance Dynamic Standing - Balance Support: During functional activity Dynamic Standing - Level of Assistance: 4: Min assist Dynamic Standing - Balance Activities: Forward lean/weight shifting;Reaching for objects Dynamic Standing - Comments: standing at sink to comb hair, wash face, brush teeth with excessive forward lean   End of Session PT - End of Session Equipment Utilized During Treatment: Gait belt Activity Tolerance: Patient limited by pain Patient left: in chair;with call bell/phone within reach;with chair alarm set;with nursing/sitter in room Nurse Communication: Other (comment) (need to replace condom cath)   GP     [facility] 09/21/2012, 3:11 PM [place], [place] [phone] 09/21/2012

## 2012-09-22 LAB — GLUCOSE, CAPILLARY: Glucose-Capillary: 124 mg/dL — ABNORMAL HIGH (ref 70–99)

## 2012-09-22 MED ORDER — LEVOFLOXACIN 250 MG PO TABS: 250.0000 mg | ORAL_TABLET | Freq: Every day | ORAL | Status: DC

## 2012-09-22 MED ORDER — SULFAMETHOXAZOLE-TMP DS 800-160 MG PO TABS: 1.0000 | ORAL_TABLET | Freq: Two times a day (BID) | ORAL | Status: DC

## 2012-09-22 MED ORDER — ACETAMINOPHEN 500 MG PO TABS: 1000.0000 mg | ORAL_TABLET | Freq: Two times a day (BID) | ORAL | Status: DC

## 2012-09-22 MED ORDER — IRBESARTAN 150 MG PO TABS: 150.0000 mg | ORAL_TABLET | Freq: Every day | ORAL | Status: DC

## 2012-09-22 MED ORDER — INSULIN ASPART 100 UNIT/ML ~~LOC~~ SOLN: SUBCUTANEOUS | Status: DC

## 2012-09-22 NOTE — Care Management Note (Signed)
    Page 1 of 1   09/22/2012     10:16:28 AM   CARE MANAGEMENT NOTE 09/22/2012  Patient:  Albert Nelson, Albert Nelson   Account Number:  [id number]  Date Initiated:  09/22/2012  Documentation initiated by:  Letha Cape  Subjective/Objective Assessment:   dx syncope and collapse  admit-lives with spouse.     Action/Plan:   Anticipated DC Date:  09/22/2012   Anticipated DC Plan:  SKILLED NURSING FACILITY  In-house referral  Clinical Social Worker      DC Planning Services  CM consult      Choice offered to / List presented to:             Status of service:  Completed, signed off Medicare Important Message given?   (If response is "NO", the following Medicare IM given date fields will be blank) Date Medicare IM given:   Date Additional Medicare IM given:    Discharge Disposition:  SKILLED NURSING FACILITY  Per UR Regulation:  Reviewed for med. necessity/level of care/duration of stay  If discussed at Long Length of Stay Meetings, dates discussed:    Comments:  09/22/12  10:15 Letha Cape RN, BSN [phone] patient for dc to [facility] today, CSW following.

## 2012-09-22 NOTE — Discharge Summary (Signed)
PATIENT DETAILS Name: Albert Nelson Age: 79 y.o. Sex: male Date of Birth: [birth year] MRN: [id number]. Admit Date: 09/17/2012 Admitting Physician: Jerald Kief, MD WUJ:WJXBJ,YNWGNFA S, MD  Recommendations for Outpatient Follow-up:  1. Please continue with antibiotics as noted below. 2. Please check chemistry panel periodically.  PRIMARY DISCHARGE DIAGNOSIS:  Principal Problem:   Syncope and collapse Active Problems:   UTI (lower urinary tract infection)   Diabetes mellitus   CAD (coronary artery disease)   Back pain      PAST MEDICAL HISTORY: Past Medical History  Diagnosis Date  . Dementia   . Chronic back pain   . GERD (gastroesophageal reflux disease)   . High cholesterol   . Hypertension   . Coronary artery disease   . Myocardial infarction   . Shortness of breath   . Diabetes mellitus without complication   . Stroke   . Anemia     DISCHARGE MEDICATIONS:   Medication List    STOP taking these medications       glimepiride 4 MG tablet  Commonly known as:  AMARYL     valsartan-hydrochlorothiazide 160-12.5 MG per tablet  Commonly known as:  DIOVAN-HCT      TAKE these medications       acetaminophen 500 MG tablet  Commonly known as:  TYLENOL  Take 2 tablets (1,000 mg total) by mouth 2 (two) times daily.     allopurinol 100 MG tablet  Commonly known as:  ZYLOPRIM  Take 100 mg by mouth daily.     aspirin EC 81 MG tablet  Take 81 mg by mouth daily.     beta carotene w/minerals tablet  Take 1 tablet by mouth daily.     CITRUCEL PO  Take 2 tablets by mouth daily.     diltiazem 180 MG 24 hr capsule  Commonly known as:  TIAZAC  Take 180 mg by mouth daily.     dutasteride 0.5 MG capsule  Commonly known as:  AVODART  Take 0.5 mg by mouth daily.     Fish Oil 1200 MG Caps  Take 1 capsule by mouth daily.     insulin aspart 100 UNIT/ML injection  Commonly known as:  novoLOG  - 0-15 Units, Subcutaneous, 3 times daily with mealsCBG < 70: implement  hypoglycemia protocol  - CBG 70 - 120: 0 units  - CBG 121 - 150: 2 units  - CBG 151 - 200: 3 units  - CBG 201 - 250: 5 units  - CBG 251 - 300: 8 units  - CBG 301 - 350: 11 units  - CBG 351 - 400: 15 units  - CBG > 400: call MD     irbesartan 150 MG tablet  Commonly known as:  AVAPRO  Take 1 tablet (150 mg total) by mouth daily.     lansoprazole 30 MG capsule  Commonly known as:  PREVACID  Take 30 mg by mouth daily.     levofloxacin 250 MG tablet  Commonly known as:  LEVAQUIN  Take 1 tablet (250 mg total) by mouth daily at 8 pm. Till 09/25/12 and then discontinue     nabumetone 500 MG tablet  Commonly known as:  RELAFEN  Take 500 mg by mouth 2 (two) times daily.     rosuvastatin 10 MG tablet  Commonly known as:  CRESTOR  Take 10 mg by mouth daily.     solifenacin 10 MG tablet  Commonly known as:  VESICARE  Take 10 mg by  mouth daily.     sulfamethoxazole-trimethoprim 800-160 MG per tablet  Commonly known as:  BACTRIM DS  Take 1 tablet by mouth every 12 (twelve) hours.        ALLERGIES:   Allergies  Allergen Reactions  . Penicillins Anaphylaxis    BRIEF HPI:  See H&P, Labs, Consult and Test reports for all details in brief, Albert Nelson is a 79 y.o. male with a pmh of diabetes and cad s/p CABG and multiple stenting who presented to the ED after being found unresponsive.  patient reportedly was locked out of his house at around 10am and shortly thereafter, became unresponsive in the heat. The patient was found on the ground by neighbors and the patient was then brought to the ED via EMS. The patient was noted to be initially hyperthermic, requiring icing down. In the ED, the patient had a presenting max temp of 100.9. IVF were started.The patient was noted to have large leukocyte esterase and many bacteria in the urine. The hospitalist service was consulted for possible admission.  CONSULTATIONS:   None  PERTINENT RADIOLOGIC STUDIES: Dg Chest 2  View  09/17/2012   *RADIOLOGY REPORT*  Clinical Data: Chest pain and fever  CHEST - 2 VIEW  Comparison: None  Findings: Prior open heart surgery.  Negative for heart failure. Negative for pneumonia or effusion.  Lungs are clear.  IMPRESSION: No active cardiopulmonary abnormality.   Original Report Authenticated By: Janeece Riggers, M.D.   Dg Ribs Unilateral Left  09/19/2012   *RADIOLOGY REPORT*  Clinical Data: Left rib pain.  Rule out fracture.  LEFT RIBS - 2 VIEW  Comparison: Chest radiograph 09/17/2012  Findings: No evidence for a displaced left rib fracture.  Median sternotomy wires are present.  No evidence for a left pneumothorax.  IMPRESSION: No evidence for displaced left rib fractures.   Original Report Authenticated By: Richarda Overlie, M.D.   Ct Head Wo Contrast  09/17/2012   *RADIOLOGY REPORT*  Clinical Data: Elderly patient passed out.  Elevated blood sugar.  CT HEAD WITHOUT CONTRAST  Technique:  Contiguous axial images were obtained from the base of the skull through the vertex without contrast.  Comparison: None.  Findings: No skull fracture or intracranial hemorrhage.  Atrophy.  Superimposed hydrocephalus suspected.  Small vessel disease type changes without CT evidence of large acute infarct.  No intracranial mass lesion detected on this unenhanced exam.  Vascular calcifications.  Mastoid air cells and middle ear cavities as well as paranasal sinuses are clear.  Orbital structures unremarkable.  IMPRESSION: No skull fracture or intracranial hemorrhage.  Atrophy.  Superimposed hydrocephalus suspected.  Small vessel disease type changes without CT evidence of large acute infarct.   Original Report Authenticated By: Lacy Duverney, M.D.     PERTINENT LAB RESULTS: CBC:  Recent Labs  09/20/12 0708  WBC 10.6*  HGB 10.7*  HCT 31.6*  PLT 209   CMET CMP     Component Value Date/Time   NA 138 09/20/2012 0708   K 4.0 09/20/2012 0708   CL 103 09/20/2012 0708   CO2 26 09/20/2012 0708   GLUCOSE 121*  09/20/2012 0708   BUN 17 09/20/2012 0708   CREATININE 1.13 09/20/2012 0708   CALCIUM 9.4 09/20/2012 0708   PROT 6.4 09/18/2012 0500   ALBUMIN 3.0* 09/18/2012 0500   AST 11 09/18/2012 0500   ALT 7 09/18/2012 0500   ALKPHOS 59 09/18/2012 0500   BILITOT 0.5 09/18/2012 0500   GFRNONAA 57* 09/20/2012 0708  GFRAA 66* 09/20/2012 0708    GFR Estimated Creatinine Clearance: 49.2 ml/min (by C-G formula based on Cr of 1.13). No results found for this basename: LIPASE, AMYLASE,  in the last 72 hours No results found for this basename: CKTOTAL, CKMB, CKMBINDEX, TROPONINI,  in the last 72 hours No components found with this basename: POCBNP,  No results found for this basename: DDIMER,  in the last 72 hours No results found for this basename: HGBA1C,  in the last 72 hours No results found for this basename: CHOL, HDL, LDLCALC, TRIG, CHOLHDL, LDLDIRECT,  in the last 72 hours No results found for this basename: TSH, T4TOTAL, FREET3, T3FREE, THYROIDAB,  in the last 72 hours No results found for this basename: VITAMINB12, FOLATE, FERRITIN, TIBC, IRON, RETICCTPCT,  in the last 72 hours Coags: No results found for this basename: PT, INR,  in the last 72 hours Microbiology: Recent Results (from the past 240 hour(s))  URINE CULTURE     Status: None   Collection Time    09/17/12  2:44 PM      Result Value Range Status   Specimen Description URINE, CATHETERIZED   Final   Special Requests NONE   Final   Culture  Setup Time 09/17/2012 22:33   Final   Colony Count >=100,000 COLONIES/ML   Final   Culture     Final   Value: ESCHERICHIA COLI     VIRIDANS STREPTOCOCCUS   Report Status 09/19/2012 FINAL   Final   Organism ID, Bacteria ESCHERICHIA COLI   Final  CULTURE, BLOOD (ROUTINE X 2)     Status: None   Collection Time    09/19/12  9:40 AM      Result Value Range Status   Specimen Description BLOOD RIGHT ANTECUBITAL   Final   Special Requests BOTTLES DRAWN AEROBIC ONLY 10CC   Final   Culture  Setup Time  09/19/2012 12:59   Final   Culture     Final   Value:        BLOOD CULTURE RECEIVED NO GROWTH TO DATE CULTURE WILL BE HELD FOR 5 DAYS BEFORE ISSUING A FINAL NEGATIVE REPORT   Report Status PENDING   Incomplete  CULTURE, BLOOD (ROUTINE X 2)     Status: None   Collection Time    09/19/12  9:45 AM      Result Value Range Status   Specimen Description BLOOD HAND RIGHT   Final   Special Requests BOTTLES DRAWN AEROBIC ONLY 10CC   Final   Culture  Setup Time 09/19/2012 12:59   Final   Culture     Final   Value:        BLOOD CULTURE RECEIVED NO GROWTH TO DATE CULTURE WILL BE HELD FOR 5 DAYS BEFORE ISSUING A FINAL NEGATIVE REPORT   Report Status PENDING   Incomplete     BRIEF HOSPITAL COURSE:   Principal Problem:   Syncope:  -Likely secondary to dehydration and active UTI  -Hold diuretics for now,was given IVF-will stop today  -Currently denies any dizziness  -Tele-unremarkable  -Echo-poor study-but likely normal EF  -CT head on admit-neg for acute abnormalities  Active Problems: UTI:  -Reports PCN allergy.  -Urine culture showed E Coli and Streptococcus viridans.  -Blood culture obtained on 7/15-negative so far -continue with Bactrim and Levaquin till 7/21  Acute renal failure  -Secondary to dehydration volume depletion.  -Presented with creatinine of 1.5, creatinine is normal today. This has resolved.   Diabetes:  -  Will continue the patient on SSI on discharge - hold oral meds for now-previously on amaryl - Diabetic diet   CAD:  - Stable. Asymptomatic    HTN  -controlled with Avapro, resume Cardizem on discharge as BP creeping up  Chronic Back pain:  -Cont pain meds as tolerated  -Reported more chest pain to the left side, rib x-ray was obtained-which was neg for fracture  -spoke with spouse at bedside-he takes Tylenol atleast twice a day-will start scheduled tylenol BID  Deconditioning  - Spouse reluctant on 7/16-now agreeable to SNF 7/17 -Being discharged to SNF on  7/18  TODAY-DAY OF DISCHARGE:  Subjective:   Saed Hudlow today has no headache,no chest abdominal pain,no new weakness tingling or numbness, denies any back pain today  Objective:   Blood pressure 167/73, pulse 65, temperature 97.8 F (36.6 C), temperature source Oral, resp. rate 18, height 5\' 6"  (1.676 m), weight 89.6 kg (197 lb 8.5 oz), SpO2 96.00%.  Intake/Output Summary (Last 24 hours) at 09/22/12 1007 Last data filed at 09/22/12 1003  Gross per 24 hour  Intake    180 ml  Output    700 ml  Net   -520 ml   Filed Weights   09/17/12 1827  Weight: 89.6 kg (197 lb 8.5 oz)    Exam Awake Alert, Oriented *3, No new F.N deficits, Normal affect [place].AT,PERRAL Supple Neck,No JVD, No cervical lymphadenopathy appriciated.  Symmetrical Chest wall movement, Good air movement bilaterally, CTAB RRR,No Gallops,Rubs or new Murmurs, No Parasternal Heave +ve B.Sounds, Abd Soft, Non tender, No organomegaly appriciated, No rebound -guarding or rigidity. No Cyanosis, Clubbing or edema, No new Rash or bruise  DISCHARGE CONDITION: Stable  DISPOSITION: SNF   DISCHARGE INSTRUCTIONS:    Activity:  As tolerated with Full fall precautions use walker/cane & assistance as needed  Diet recommendation: Diabetic Diet Heart Healthy diet      Discharge Orders   Future Orders Complete By Expires     Call MD for:  persistant nausea and vomiting  As directed     Diet - low sodium heart healthy  As directed     Diet Carb Modified  As directed     Increase activity slowly  As directed        Follow-up Information   Follow up with Laurena Slimmer, MD. Schedule an appointment as soon as possible for a visit in 2 weeks. (after dicharge from SNF)    Contact information:   [address] [place] [address] [phone]      Total Time spent on discharge equals 45 minutes.  SignedJeoffrey Massed 09/22/2012 10:07 AM

## 2012-09-22 NOTE — Progress Notes (Signed)
Patient DC'ed to [facility] SNF.  No changes noted since am assessment.  No IV present.  NT removed condom cath prior to dc.  Patient transported to facility via ambulance.

## 2012-09-22 NOTE — Progress Notes (Signed)
Report called to [facility] at [phone].  They denied questions or concerns

## 2012-09-22 NOTE — Plan of Care (Signed)
Problem: Phase III Progression Outcomes Goal: Discharge plan remains appropriate-arrangements made Outcome: Adequate for Discharge SNF for rehab

## 2012-09-23 NOTE — Progress Notes (Signed)
CSW received call from nursing services that patient's wife has reconsidered placement and not wants to talk to CSW for placement for short term SNF. CSW met with patient and his wife- SNF bed search process discussed and initiated.  They are requested placement at [facility].  Bed offer in place from [facility] and they have accepted offer. Plan d/c to SNF tomorrow if medically per MD. .Lupita Leash T. [place]  [phone]

## 2012-09-23 NOTE — Clinical Social Work Placement (Signed)
     Clinical Social Work Department CLINICAL SOCIAL WORK PLACEMENT NOTE 09/23/2012  Patient:  Albert Nelson, Albert Nelson  Account Number:  [id number] Admit date:  09/17/2012  Clinical Social Worker:  Lupita Leash Akemi Overholser, LCSWA  Date/time:  09/21/2012 04:00 PM  Clinical Social Work is seeking post-discharge placement for this patient at the following level of care:   SKILLED NURSING   (*CSW will update this form in Epic as items are completed)   09/21/2012  Patient/family provided with [facility] Department of Clinical Social Works list of facilities offering this level of care within the geographic area requested by the patient (or if unable, by the patients family).  09/21/2012  Patient/family informed of their freedom to choose among providers that offer the needed level of care, that participate in Medicare, Medicaid or managed care program needed by the patient, have an available bed and are willing to accept the patient.  09/21/2012  Patient/family informed of [facility] ownership interest in [facility], as well as of the fact that they are under no obligation to receive care at this facility.  PASARR submitted to EDS on 09/22/2012 PASARR number received from [employer]DS on 09/22/2012  FL2 transmitted to all facilities in geographic area requested by pt/family on  09/21/2012 FL2 transmitted to all facilities within larger geographic area on   Patient informed that his/her managed care company has contracts with or will negotiate with  certain facilities, including the following:     Patient/family informed of bed offers received:  09/21/2012 Patient chooses bed at [facility] Physician recommends and patient chooses bed at    Patient to be transferred to [facility] on  09/21/2012 Patient to be transferred to facility by Ambulance  Sharin Mons)  The following physician request were entered in Epic:   Additional Comments: DC 09/22/12 to SNF. OK per  patient and wife. They are very pleased with d/c plan. Notfied SNF and pt's nurse to give report.  No further CSW needs identified. CSW signing off. Patient will be admitted for short term rehab.

## 2012-09-25 ENCOUNTER — Encounter (HOSPITAL_COMMUNITY): Payer: Self-pay | Admitting: Emergency Medicine

## 2012-09-25 LAB — CULTURE, BLOOD (ROUTINE X 2): Culture: NO GROWTH

## 2012-12-12 ENCOUNTER — Encounter: Payer: Self-pay | Admitting: Cardiology

## 2012-12-12 ENCOUNTER — Ambulatory Visit (INDEPENDENT_AMBULATORY_CARE_PROVIDER_SITE_OTHER): Payer: PRIVATE HEALTH INSURANCE | Admitting: Cardiology

## 2012-12-12 VITALS — BP 110/60 | HR 61 | Ht 67.0 in | Wt 192.5 lb

## 2012-12-12 DIAGNOSIS — Z9861 Coronary angioplasty status: Secondary | ICD-10-CM

## 2012-12-12 DIAGNOSIS — E785 Hyperlipidemia, unspecified: Secondary | ICD-10-CM

## 2012-12-12 DIAGNOSIS — I5032 Chronic diastolic (congestive) heart failure: Secondary | ICD-10-CM

## 2012-12-12 DIAGNOSIS — I10 Essential (primary) hypertension: Secondary | ICD-10-CM

## 2012-12-12 DIAGNOSIS — I2510 Atherosclerotic heart disease of native coronary artery without angina pectoris: Secondary | ICD-10-CM

## 2012-12-12 DIAGNOSIS — E669 Obesity, unspecified: Secondary | ICD-10-CM

## 2012-12-12 DIAGNOSIS — R55 Syncope and collapse: Secondary | ICD-10-CM

## 2012-12-12 DIAGNOSIS — I5030 Unspecified diastolic (congestive) heart failure: Secondary | ICD-10-CM

## 2012-12-12 DIAGNOSIS — I509 Heart failure, unspecified: Secondary | ICD-10-CM

## 2012-12-12 NOTE — Patient Instructions (Signed)
Things seem to be stable.   Blood Pressure, Heart Rate & ECG all look good.  Dr. Chestine Spore is following the cholesterol levels.    I am going to leave things alone for now.  I think yearly visits are fine, but do not hesitate to contact our office if you need to be seen sooner.  Marykay Lex, MD  Your physician wants you to follow-up in: 12 months You will receive a reminder letter in the mail two months in advance. If you don't receive a letter, please call our office to schedule the follow-up appointment.

## 2012-12-14 ENCOUNTER — Encounter: Payer: Self-pay | Admitting: Cardiology

## 2012-12-14 DIAGNOSIS — Z951 Presence of aortocoronary bypass graft: Secondary | ICD-10-CM | POA: Insufficient documentation

## 2012-12-14 DIAGNOSIS — I5032 Chronic diastolic (congestive) heart failure: Secondary | ICD-10-CM | POA: Insufficient documentation

## 2012-12-14 DIAGNOSIS — E669 Obesity, unspecified: Secondary | ICD-10-CM | POA: Insufficient documentation

## 2012-12-14 DIAGNOSIS — I10 Essential (primary) hypertension: Secondary | ICD-10-CM | POA: Insufficient documentation

## 2012-12-14 DIAGNOSIS — I2510 Atherosclerotic heart disease of native coronary artery without angina pectoris: Secondary | ICD-10-CM | POA: Insufficient documentation

## 2012-12-14 NOTE — Assessment & Plan Note (Signed)
Clearly no symptoms of PND, orthopnea or edema to suggest significant heart failure symptoms.  The mainstay of therapy here is blood pressure control.

## 2012-12-14 NOTE — Assessment & Plan Note (Signed)
Currently on statin, tolerating well.  Monitored by PCP.  He did have labs checked recently, however I do not have the results

## 2012-12-14 NOTE — Assessment & Plan Note (Addendum)
He is doing fairly well.  In the past he was noting some mild stable angina, but did not comment on it today.  He is not on a beta blocker, but is on diltiazem which is acceptable with his EF.  He is on an ARB as well as statin and aspirin plus Plavix.  As he is having no active symptoms of either angina or heart failure, we will continue to monitor and treat medically.  His last stress test was in 2010, and without active symptoms, and no inclination to pursue any screening testing. Plan: Continue conservative medical therapy.

## 2012-12-14 NOTE — Assessment & Plan Note (Signed)
Very well controlled on ARB and calcium blocker.

## 2012-12-14 NOTE — Progress Notes (Signed)
PATIENT: Albert Nelson MRN: [id number]  DOB: 02/13/1924   DOV:12/14/2012 PCP: Laurena Slimmer, MD  Clinic Note: Chief Complaint  Patient presents with  . Follow-up    12 months rov, locked himself out the house and was in the sun for a long period of time and pass out was in the hospital in july 13-17.    HPI: Albert Nelson is a 79 y.o. male with a PMH below who presents today for annual followup.  He has a history of coronary disease with CABG in 1987 (LIMA-LAD, SVG diagonal is yet and RCA) followed by a cardiac catheterization 2004 demonstrating occluded vein grafts with PCI to the native circumflex and native posterior lateral artery.  2 years later he returned for Cutting Balloon PTCA of ISR in the circumflex.  Since then, he has had a Myoview in 2010 which was notable for [facility]. perfusion defect in the apical inferior and mid inferior wall with mild/moderate ischemia, the plan was to treat medically.  He has preserved ejection fraction with moderate aortic sclerosis by echocardiogram. In the past, the decision was made to forego any further invasive evaluations of after his stroke in 2008 and then followup event in May of 2013.  Interval History: He presents today doing relatively well, no major complaints.  He was admitted to the hospital for a brief stay in the summertime do to his stroke-related syncope.  He apparently locked himself out of his house and evaluated by 10, if unable to get all somebody he was found relatively unresponsive.  Since then actually doing relatively fine.    He is quite debilitated, walks with a very shuffling gait using a walker.  He's really does not exert herself very much.  With the extent exertion he does, he has no chest tightness or chest pressure/shortness of breath.  He denies any symptoms at rest either.  He has not any further neurologic findings so TIA or amaurosis fugax.  Aside from that hospitalization for he related syncope, has not had any other  significant near-syncope.  He denies any palpitations or rapid heartbeats.  He does have occasional positional lightheadedness.  The remainder of Cardiovascular ROS: negative: Additional cardiac review of systems: Lightheadedness - no, dizziness - no, syncope/near-syncope - no; TIA/amaurosis fugax - no Melena - no, hematochezia no; hematuria - no; nosebleeds - no; claudication - no  Past Medical History  Diagnosis Date  . Diabetes mellitus   . Dementia   . Chronic back pain   . GERD (gastroesophageal reflux disease)   . High cholesterol   . Hypertension   . Shortness of breath   . Stroke   . Anemia   . History of myocardial infarction 1987  . CAD in native artery 1987    Referred for CABG x3  . S/P CABG x 3 1987    LIMA-LAD, SVG-Diag, SVG-RCA  . CAD (coronary artery disease), autologous vein bypass graft 2004    Both SVG-diagonal and-RCA occluded --> PCI to the native circumflex and PL  . CAD S/P percutaneous coronary angioplasty 2004    PCI-RPL: 3.0-mm-x-23-mm Cypher DES; PCI-Cx-OM: 2.5-x-26-mm 2.5-x-13-mm and another 2.5-x-13-mm(overlapping) -> proximal post-dilation to 2.85  . S/P cardiac catheterization 2006    ISR in circumflex stent --> Cutting Balloon PTCA (2.75 balloon);; proximal LAD 75% with 100% mid occlusion after SP1, RCA 50% proximal, patent stent in probable PLA.  Patent LIMA.  3 sequential 99% lesions in distal LAD, small vessel  . Syncope and collapse  summer 2014     Associated with the exhaustion/heat stroke after being locked outside.    Prior Cardiac Evaluation and Past Surgical History: Past Surgical History  Procedure Laterality Date  . Coronary stent placement  January 2004    Both SVG is occluded.  PCI-RPL 3.0-mm-x-23-mm Cypher DES; PCI-Cx-OM : Cypher 2.5-x-26-mm   . Coronary artery bypass graft  1987    LIMA-LAD, SVG-diagonal, SVG-RCA  . Coronary angioplasty  November 2006    Cutting balloon PTCA with a proximal in-stent restenosis in the  . Nm  myoview ltd  June 2010    Persantine Myoview showing medium-sized moderate-intensity defect in the apical inferior and mid inferior wall with mild to moderate ischemia in the distribution of the RCA and distal circumflex. Treated medically   . Transthoracic echocardiogram  June 2010    EF 50% to 55% with mildly dilated left atrium and moderate aortic sclerosis - no stenosis    Allergies  Allergen Reactions  . Penicillins Anaphylaxis  . Penicillins     Unknown    Current Outpatient Prescriptions  Medication Sig Dispense Refill  . acetaminophen (TYLENOL) 500 MG tablet Take 2 tablets (1,000 mg total) by mouth 2 (two) times daily.  30 tablet  0  . allopurinol (ZYLOPRIM) 100 MG tablet Take 100 mg by mouth daily.      Marland Kitchen aspirin EC 81 MG tablet Take 81 mg by mouth daily.      . beta carotene w/minerals (OCUVITE) tablet Take 1 tablet by mouth daily.      . clopidogrel (PLAVIX) 75 MG tablet Take 75 mg by mouth daily.      Marland Kitchen diltiazem (TIAZAC) 180 MG 24 hr capsule Take 180 mg by mouth daily.      Marland Kitchen dutasteride (AVODART) 0.5 MG capsule Take 0.5 mg by mouth daily.      Marland Kitchen glimepiride (AMARYL) 4 MG tablet Take 4 mg by mouth daily before breakfast.      . lansoprazole (PREVACID) 30 MG capsule Take 30 mg by mouth daily.      . Methylcellulose, Laxative, (CITRUCEL PO) Take 2 tablets by mouth daily.      . nabumetone (RELAFEN) 500 MG tablet Take 500 mg by mouth 2 (two) times daily.      . Omega-3 Fatty Acids (FISH OIL) 1200 MG CAPS Take 1 capsule by mouth daily.      . rosuvastatin (CRESTOR) 10 MG tablet Take 1 tablet (10 mg total) by mouth daily at 6 PM.  30 tablet  0  . solifenacin (VESICARE) 10 MG tablet Take 10 mg by mouth daily.      . valsartan (DIOVAN) 160 MG tablet Take 160 mg by mouth daily.       No current facility-administered medications for this visit.    History   Social History Narrative   He is a married father of 3, grandfather of 6, great-grandfather of 6.    He does not walk  much due to arthritic pains as well as his shuffling gait.   He does not smoke, does not drink. He does get around some but is not that much.       ROS: A comprehensive Review of Systems - Negative except His baseline deconditioning and mobility difficulties following a stroke.  PHYSICAL EXAM BP 110/60  Pulse 61  Ht 5\' 7"  (1.702 m)  Wt 192 lb 8 oz (87.317 kg)  BMI 30.14 kg/m2 General:  he is a very pleasant, healthy-appearing gentleman, NAD; alert  and oriented x3, answers questions appropriately, but reluctantly.  HEENT: NCAT. EOMI. MMM. Neck: Supple. No LAN, JVD, or carotid bruit.  Lungs: CTAB. Nonlabored. Normal effort. Good air movement.  Heart: RRR. Normal S1, S2. No rubs or gallops; 1/6 SEM at our or RUSB but no radiation.  Abdomen: Soft/NT/ND/NABS. No HSM.  Extremities: No C/C/E.  Neuromuscular: Shuffling gait with walker.  ONG:EXBMWUXLK today: Yes Rate: 61 , Rhythm: Sinus rhythm, sinus arrhythmia; otherwise normal;    Recent Labs: Checked by PCP, not available  ASSESSMENT / PLAN: CAD S/P percutaneous coronary angioplasty He is doing fairly well.  In the past he was noting some mild stable angina, but did not comment on it today.  He is not on a beta blocker, but is on diltiazem which is acceptable with his EF.  He is on an ARB as well as statin and aspirin plus Plavix.  As he is having no active symptoms of either angina or heart failure, we will continue to monitor and treat medically.  His last stress test was in 2010, and without active symptoms, and no inclination to pursue any screening testing. Plan: Continue conservative medical therapy.  CAD (coronary artery disease) See above  Syncope and collapse - related to heat stroke He did have one episode is hot outside and he was locked out of his house accidentally.  He's had any other episodes since then.  It used to be just dehydrated from the heat. This is on good reason to not have him on any diuretic.  Thankfully he  doesn't have any heart failure symptoms.  Essential hypertension Very well controlled on ARB and calcium blocker.  Hyperlipidemia Currently on statin, tolerating well.  Monitored by PCP.  He did have labs checked recently, however I do not have the results  Obesity (BMI 30-39.9) He seems to fluctuate up and down on his weight.  His last visit head and below the "obesity threshold, however this one is barely above.  A portion lesion that had been excised and burning calories to any great extent.  He does do some upper body cardio exercise.  Recently discussed some modifications in his diet with his wife to  Chronic diastolic CHF (congestive heart failure), NYHA class 2 Clearly no symptoms of PND, orthopnea or edema to suggest significant heart failure symptoms.  The mainstay of therapy here is blood pressure control.    Orders Placed This Encounter  Procedures  . EKG 12-Lead   No orders of the defined types were placed in this encounter.    Followup: 1 year  DAVID W. Herbie [place], M.D., M.S. [facility] [address] [place], [place]  [address]  [phone] Pager # [phone]

## 2012-12-14 NOTE — Assessment & Plan Note (Deleted)
Clearly no symptoms of PND, orthopnea or edema to suggest significant heart failure symptoms.  The mainstay of therapy here is blood pressure control. 

## 2012-12-14 NOTE — Assessment & Plan Note (Signed)
He did have one episode is hot outside and he was locked out of his house accidentally.  He's had any other episodes since then.  It used to be just dehydrated from the heat. This is on good reason to not have him on any diuretic.  Thankfully he doesn't have any heart failure symptoms.

## 2012-12-14 NOTE — Assessment & Plan Note (Signed)
See above

## 2012-12-14 NOTE — Assessment & Plan Note (Signed)
He seems to fluctuate up and down on his weight.  His last visit head and below the "obesity threshold, however this one is barely above.  A portion lesion that had been excised and burning calories to any great extent.  He does do some upper body cardio exercise.  Recently discussed some modifications in his diet with his wife to

## 2013-01-29 ENCOUNTER — Emergency Department (HOSPITAL_COMMUNITY): Payer: PRIVATE HEALTH INSURANCE

## 2013-01-29 ENCOUNTER — Emergency Department (HOSPITAL_COMMUNITY)
Admission: EM | Admit: 2013-01-29 | Discharge: 2013-01-29 | Disposition: A | Discharge status: Home / Self Care | Payer: PRIVATE HEALTH INSURANCE | Attending: Emergency Medicine | Admitting: Emergency Medicine

## 2013-01-29 ENCOUNTER — Encounter (HOSPITAL_COMMUNITY): Payer: Self-pay | Admitting: Emergency Medicine

## 2013-01-29 DIAGNOSIS — Z9861 Coronary angioplasty status: Secondary | ICD-10-CM | POA: Insufficient documentation

## 2013-01-29 DIAGNOSIS — R059 Cough, unspecified: Secondary | ICD-10-CM | POA: Insufficient documentation

## 2013-01-29 DIAGNOSIS — R05 Cough: Secondary | ICD-10-CM | POA: Insufficient documentation

## 2013-01-29 DIAGNOSIS — I2510 Atherosclerotic heart disease of native coronary artery without angina pectoris: Secondary | ICD-10-CM | POA: Insufficient documentation

## 2013-01-29 DIAGNOSIS — Z79899 Other long term (current) drug therapy: Secondary | ICD-10-CM | POA: Insufficient documentation

## 2013-01-29 DIAGNOSIS — Z7982 Long term (current) use of aspirin: Secondary | ICD-10-CM | POA: Insufficient documentation

## 2013-01-29 DIAGNOSIS — G8929 Other chronic pain: Secondary | ICD-10-CM | POA: Insufficient documentation

## 2013-01-29 DIAGNOSIS — Z862 Personal history of diseases of the blood and blood-forming organs and certain disorders involving the immune mechanism: Secondary | ICD-10-CM | POA: Insufficient documentation

## 2013-01-29 DIAGNOSIS — R079 Chest pain, unspecified: Secondary | ICD-10-CM | POA: Insufficient documentation

## 2013-01-29 DIAGNOSIS — Z880 Allergy status to penicillin: Secondary | ICD-10-CM | POA: Insufficient documentation

## 2013-01-29 DIAGNOSIS — Z87891 Personal history of nicotine dependence: Secondary | ICD-10-CM | POA: Insufficient documentation

## 2013-01-29 DIAGNOSIS — K219 Gastro-esophageal reflux disease without esophagitis: Secondary | ICD-10-CM | POA: Insufficient documentation

## 2013-01-29 DIAGNOSIS — Z8673 Personal history of transient ischemic attack (TIA), and cerebral infarction without residual deficits: Secondary | ICD-10-CM | POA: Insufficient documentation

## 2013-01-29 DIAGNOSIS — I10 Essential (primary) hypertension: Secondary | ICD-10-CM | POA: Insufficient documentation

## 2013-01-29 DIAGNOSIS — Z95818 Presence of other cardiac implants and grafts: Secondary | ICD-10-CM | POA: Insufficient documentation

## 2013-01-29 DIAGNOSIS — I252 Old myocardial infarction: Secondary | ICD-10-CM | POA: Insufficient documentation

## 2013-01-29 DIAGNOSIS — Z951 Presence of aortocoronary bypass graft: Secondary | ICD-10-CM | POA: Insufficient documentation

## 2013-01-29 DIAGNOSIS — E7800 Pure hypercholesterolemia, unspecified: Secondary | ICD-10-CM | POA: Insufficient documentation

## 2013-01-29 DIAGNOSIS — E119 Type 2 diabetes mellitus without complications: Secondary | ICD-10-CM | POA: Insufficient documentation

## 2013-01-29 DIAGNOSIS — F0390 Unspecified dementia without behavioral disturbance: Secondary | ICD-10-CM | POA: Insufficient documentation

## 2013-01-29 DIAGNOSIS — Z7902 Long term (current) use of antithrombotics/antiplatelets: Secondary | ICD-10-CM | POA: Insufficient documentation

## 2013-01-29 DIAGNOSIS — R042 Hemoptysis: Secondary | ICD-10-CM

## 2013-01-29 LAB — BASIC METABOLIC PANEL
BUN: 25 mg/dL — ABNORMAL HIGH (ref 6–23)
Calcium: 9.1 mg/dL (ref 8.4–10.5)
GFR calc Af Amer: 60 mL/min — ABNORMAL LOW (ref >90)
GFR calc non Af Amer: 51 mL/min — ABNORMAL LOW (ref >90)
Glucose, Bld: 277 mg/dL — ABNORMAL HIGH (ref 70–99)
Sodium: 143 mEq/L (ref 135–145)

## 2013-01-29 LAB — CBC
Hemoglobin: 10.1 g/dL — ABNORMAL LOW (ref 13.0–17.0)
MCH: 32.4 pg (ref 26.0–34.0)
MCHC: 33.4 g/dL (ref 30.0–36.0)
RDW: 13.3 % (ref 11.5–15.5)

## 2013-01-29 LAB — PROTIME-INR: INR: 1.05 (ref 0.00–1.49)

## 2013-01-29 MED ORDER — SODIUM CHLORIDE 0.9 % IV BOLUS (SEPSIS)
500.0000 mL | Freq: Once | INTRAVENOUS | Status: AC
  Administered 2013-01-29: 500 mL via INTRAVENOUS

## 2013-01-29 MED ORDER — IOHEXOL 350 MG/ML SOLN
100.0000 mL | Freq: Once | INTRAVENOUS | Status: AC | PRN
  Administered 2013-01-29: 80 mL via INTRAVENOUS

## 2013-01-29 NOTE — ED Notes (Signed)
Pt returned from CT °

## 2013-01-29 NOTE — ED Notes (Signed)
Pt states yesterday morning he spit up blood, last night he sneezed and spit out a "teacup full" of blood. Pt states yesterday morning woke up with sharp chest pain. Pt feels SOB. Pt VSS, in NAD.

## 2013-01-29 NOTE — ED Notes (Addendum)
Sob and coughing up blood x 1 month has seen the dr he states  Has not had any today  Hurts to cough he states  Nose and throat have been hurting x 10 days ago

## 2013-01-29 NOTE — ED Provider Notes (Signed)
CSN: [id number]     Arrival date & time 01/29/13  1007 History   First MD Initiated Contact with Patient 01/29/13 1046     Chief Complaint  Patient presents with  . Shortness of Breath  . Chest Pain   (Consider location/radiation/quality/duration/timing/severity/associated sxs/prior Treatment) Patient is a 79 y.o. male presenting with cough. The history is provided by the patient and the spouse.  Cough Cough characteristics:  Productive Sputum characteristics:  Bloody Severity:  Moderate Onset quality:  Sudden Duration:  2 days Timing:  Sporadic Progression:  Unchanged Chronicity:  New Smoker: no   Context: not sick contacts, not smoke exposure, not upper respiratory infection and not weather changes   Relieved by:  Nothing Worsened by:  Nothing tried Ineffective treatments:  None tried Associated symptoms: no chills, no ear fullness, no fever, no rash, no rhinorrhea and no shortness of breath     Past Medical History  Diagnosis Date  . Diabetes mellitus   . Dementia   . Chronic back pain   . GERD (gastroesophageal reflux disease)   . High cholesterol   . Hypertension   . Shortness of breath   . Stroke   . Anemia   . History of myocardial infarction 1987  . CAD in native artery 1987    Referred for CABG x3  . S/P CABG x 3 1987    LIMA-LAD, SVG-Diag, SVG-RCA  . CAD (coronary artery disease), autologous vein bypass graft 2004    Both SVG-diagonal and-RCA occluded --> PCI to the native circumflex and PL  . CAD S/P percutaneous coronary angioplasty 2004    PCI-RPL: 3.0-mm-x-23-mm Cypher DES; PCI-Cx-OM: 2.5-x-26-mm 2.5-x-13-mm and another 2.5-x-13-mm(overlapping) -> proximal post-dilation to 2.85  . S/P cardiac catheterization 2006    ISR in circumflex stent --> Cutting Balloon PTCA (2.75 balloon);; proximal LAD 75% with 100% mid occlusion after SP1, RCA 50% proximal, patent stent in probable PLA.  Patent LIMA.  3 sequential 99% lesions in distal LAD, small vessel  .  Syncope and collapse  summer 2014     Associated with the exhaustion/heat stroke after being locked outside.   Past Surgical History  Procedure Laterality Date  . Coronary stent placement  January 2004    Both SVG is occluded.  PCI-RPL 3.0-mm-x-23-mm Cypher DES; PCI-Cx-OM : Cypher 2.5-x-26-mm   . Coronary artery bypass graft  1987    LIMA-LAD, SVG-diagonal, SVG-RCA  . Coronary angioplasty  November 2006    Cutting balloon PTCA with a proximal in-stent restenosis in the  . Nm myoview ltd  June 2010    Persantine Myoview showing medium-sized moderate-intensity defect in the apical inferior and mid inferior wall with mild to moderate ischemia in the distribution of the RCA and distal circumflex. Treated medically   . Transthoracic echocardiogram  June 2010    EF 50% to 55% with mildly dilated left atrium and moderate aortic sclerosis - no stenosis   No family history on file. History  Substance Use Topics  . Smoking status: Former Smoker    Quit date: 03/09/1983  . Smokeless tobacco: Never Used  . Alcohol Use: Yes     Comment: SOCIAL    Review of Systems  Constitutional: Negative for fever and chills.  HENT: Negative for rhinorrhea.   Respiratory: Positive for cough. Negative for shortness of breath.   Gastrointestinal: Negative for vomiting and abdominal pain.  Skin: Negative for rash.  All other systems reviewed and are negative.    Allergies  Penicillins  Home Medications  Current Outpatient Rx  Name  Route  Sig  Dispense  Refill  . acetaminophen (TYLENOL) 500 MG tablet   Oral   Take 2 tablets (1,000 mg total) by mouth 2 (two) times daily.   30 tablet   0   . allopurinol (ZYLOPRIM) 100 MG tablet   Oral   Take 100 mg by mouth daily.         Marland Kitchen aspirin EC 81 MG tablet   Oral   Take 81 mg by mouth daily.         . beta carotene w/minerals (OCUVITE) tablet   Oral   Take 1 tablet by mouth daily.         . clopidogrel (PLAVIX) 75 MG tablet   Oral   Take  75 mg by mouth daily.         Marland Kitchen diltiazem (TIAZAC) 180 MG 24 hr capsule   Oral   Take 180 mg by mouth daily.         Marland Kitchen dutasteride (AVODART) 0.5 MG capsule   Oral   Take 0.5 mg by mouth daily.         Marland Kitchen glimepiride (AMARYL) 4 MG tablet   Oral   Take 4 mg by mouth daily before breakfast.         . lansoprazole (PREVACID) 30 MG capsule   Oral   Take 30 mg by mouth daily.         . Methylcellulose, Laxative, (CITRUCEL PO)   Oral   Take 2 tablets by mouth daily.         . nabumetone (RELAFEN) 500 MG tablet   Oral   Take 500 mg by mouth 2 (two) times daily.         . Omega-3 Fatty Acids (FISH OIL) 1200 MG CAPS   Oral   Take 1 capsule by mouth daily.         . rosuvastatin (CRESTOR) 10 MG tablet   Oral   Take 1 tablet (10 mg total) by mouth daily at 6 PM.   30 tablet   0   . solifenacin (VESICARE) 10 MG tablet   Oral   Take 10 mg by mouth daily.         . valsartan (DIOVAN) 160 MG tablet   Oral   Take 160 mg by mouth daily.          BP 139/71  Pulse 58  Temp(Src) 98.3 F (36.8 C)  Resp 16  SpO2 96% Physical Exam  Nursing note and vitals reviewed. Constitutional: He appears well-developed and well-nourished. No distress.  HENT:  Head: Normocephalic and atraumatic.  Mouth/Throat: No oropharyngeal exudate.  Eyes: EOM are normal. Pupils are equal, round, and reactive to light.  Neck: Normal range of motion. Neck supple.  Cardiovascular: Normal rate and regular rhythm.  Exam reveals no friction rub.   No murmur heard. Pulmonary/Chest: Effort normal and breath sounds normal. No respiratory distress. He has no wheezes. He has no rales.  Abdominal: Soft. Bowel sounds are normal. He exhibits no distension. There is no tenderness. There is no rebound.  Musculoskeletal: Normal range of motion. He exhibits no edema.  Neurological: He is alert. He exhibits normal muscle tone. Coordination normal.  Skin: He is not diaphoretic.    ED Course   Procedures (including critical care time) Labs Review Labs Reviewed  CBC - Abnormal; Notable for the following:    RBC 3.12 (*)    Hemoglobin 10.1 (*)  HCT 30.2 (*)    All other components within normal limits  BASIC METABOLIC PANEL - Abnormal; Notable for the following:    Potassium 5.3 (*)    Glucose, Bld 277 (*)    BUN 25 (*)    GFR calc non Af Amer 51 (*)    GFR calc Af Amer 60 (*)    All other components within normal limits  PROTIME-INR   Imaging Review Dg Chest 2 View  01/29/2013   CLINICAL DATA:  Shortness of breath, chest pain.  EXAM: CHEST  2 VIEW  COMPARISON:  March 18, 2012.  FINDINGS: Status post coronary artery bypass graft. No pneumothorax or pleural effusion is noted. Cardiomediastinal silhouette is normal. No acute pulmonary disease is noted. Bony thorax is intact.  IMPRESSION: No acute cardiopulmonary abnormality seen.   Electronically Signed   By: Roque Lias M.D.   On: 01/29/2013 12:53   Ct Angio Chest Pe W/cm &/or Wo Cm  01/29/2013   CLINICAL DATA:  Proptosis and shortness of breath.  EXAM: CT ANGIOGRAPHY CHEST WITH CONTRAST  TECHNIQUE: Multidetector CT imaging of the chest was performed using the standard protocol during bolus administration of intravenous contrast. Multiplanar CT image reconstructions including MIPs were obtained to evaluate the vascular anatomy.  CONTRAST:  80mL OMNIPAQUE IOHEXOL 350 MG/ML SOLN  COMPARISON:  Abdomen and pelvis CT from 02/27/2008.  FINDINGS: There is no filling defect within the opacified pulmonary arteries to suggest the presence of an acute pulmonary embolus. No thoracic aortic aneurysm. Although the thoracic aorta is not well opacified, there is no discernible dissection flap within the lumen.  No axillary lymphadenopathy. No mediastinal or hilar lymphadenopathy. Heart size is upper normal. Patient is status post CABG. There is no pericardial or pleural effusion.  3 mm right lower lobe pulmonary nodule is visualized on image  43. No pulmonary edema. No focal airspace consolidation. No pneumothorax.  Images which include the upper abdomen show 1.7 cm lesion in the spleen. This is decreased from 2.6 cm on the previous CT scan, consistent with a benign process. Liver contour appears slightly nodular on today's study raising the question of, but not diagnostic for cirrhosis.  Review of the MIP images confirms the above findings.  IMPRESSION: No CT evidence for acute pulmonary embolus. No findings to explain the patient's history of hemoptysis and shortness of breath.  3 mm right lower lobe pulmonary nodule. If the patient is at high risk for bronchogenic carcinoma, follow-up chest CT at 1year is recommended. If the patient is at low risk, no follow-up is needed. This recommendation follows the consensus statement: Guidelines for Management of Small Pulmonary Nodules Detected on CT Scans: A Statement from the Fleischner Society as published in Radiology 2005; 237:395-400.   Electronically Signed   By: Kennith Center M.D.   On: 01/29/2013 15:28    EKG Interpretation    Date/Time:  Monday January 29 2013 10:27:18 EST Ventricular Rate:  64 PR Interval:  158 QRS Duration: 82 QT Interval:  392 QTC Calculation: 404 R Axis:   5 Text Interpretation:  Normal sinus rhythm Low voltage QRS Cannot rule out Anterior infarct , age undetermined Abnormal ECG No significant change since last tracing Abnormal ekg Confirmed by Gerhard Munch  MD [phone]) on 01/29/2013 10:36:32 AM Also confirmed by Gwendolyn Grant  MD, Lynell Kussman ([id number])  on 01/29/2013 11:33:41 AM            MDM   1. Hemoptysis    79 year old male presents with hemoptysis.  Patient's wife brings him in stating he's had 2 episodes yesterday. He had about a cupful each time. He has dementia and he has not reported her any difficulty breathing or chest pain. She states he is acting well otherwise. Has not had any hemoptysis today. He has no history of known cancers. He is not on any  anticoagulants, however is on antiplatelet medications. Here vitals are stable. His relaxing in bed. Wife states this is his baseline. His lungs are clear. Initial chest x-ray is normal. We will CT to look for possible PE or mass or other etiology for his hemoptysis. Scan normal. No ongoing hemoptysis, labs ok. Stable for discharge.    Dagmar Hait, MD 01/29/13 [phone]

## 2013-02-07 ENCOUNTER — Inpatient Hospital Stay (HOSPITAL_COMMUNITY)
Admission: EM | Admit: 2013-02-07 | Discharge: 2013-02-09 | DRG: 191 | Disposition: A | Discharge status: Home / Self Care | Payer: Medicare Other | Attending: Internal Medicine | Admitting: Internal Medicine

## 2013-02-07 ENCOUNTER — Emergency Department (HOSPITAL_COMMUNITY): Payer: Medicare Other

## 2013-02-07 ENCOUNTER — Encounter (HOSPITAL_COMMUNITY): Payer: Self-pay | Admitting: Emergency Medicine

## 2013-02-07 DIAGNOSIS — G8929 Other chronic pain: Secondary | ICD-10-CM | POA: Diagnosis present

## 2013-02-07 DIAGNOSIS — Z87891 Personal history of nicotine dependence: Secondary | ICD-10-CM

## 2013-02-07 DIAGNOSIS — Z66 Do not resuscitate: Secondary | ICD-10-CM | POA: Diagnosis present

## 2013-02-07 DIAGNOSIS — J440 Chronic obstructive pulmonary disease with acute lower respiratory infection: Principal | ICD-10-CM | POA: Diagnosis present

## 2013-02-07 DIAGNOSIS — Z7982 Long term (current) use of aspirin: Secondary | ICD-10-CM

## 2013-02-07 DIAGNOSIS — F0390 Unspecified dementia without behavioral disturbance: Secondary | ICD-10-CM | POA: Diagnosis present

## 2013-02-07 DIAGNOSIS — I129 Hypertensive chronic kidney disease with stage 1 through stage 4 chronic kidney disease, or unspecified chronic kidney disease: Secondary | ICD-10-CM | POA: Diagnosis present

## 2013-02-07 DIAGNOSIS — K219 Gastro-esophageal reflux disease without esophagitis: Secondary | ICD-10-CM | POA: Diagnosis present

## 2013-02-07 DIAGNOSIS — E119 Type 2 diabetes mellitus without complications: Secondary | ICD-10-CM | POA: Diagnosis present

## 2013-02-07 DIAGNOSIS — Z8673 Personal history of transient ischemic attack (TIA), and cerebral infarction without residual deficits: Secondary | ICD-10-CM

## 2013-02-07 DIAGNOSIS — Z9861 Coronary angioplasty status: Secondary | ICD-10-CM

## 2013-02-07 DIAGNOSIS — I252 Old myocardial infarction: Secondary | ICD-10-CM

## 2013-02-07 DIAGNOSIS — I509 Heart failure, unspecified: Secondary | ICD-10-CM | POA: Diagnosis present

## 2013-02-07 DIAGNOSIS — Z951 Presence of aortocoronary bypass graft: Secondary | ICD-10-CM

## 2013-02-07 DIAGNOSIS — J40 Bronchitis, not specified as acute or chronic: Secondary | ICD-10-CM

## 2013-02-07 DIAGNOSIS — N183 Chronic kidney disease, stage 3 unspecified: Secondary | ICD-10-CM | POA: Diagnosis present

## 2013-02-07 DIAGNOSIS — I5032 Chronic diastolic (congestive) heart failure: Secondary | ICD-10-CM | POA: Diagnosis present

## 2013-02-07 DIAGNOSIS — J45909 Unspecified asthma, uncomplicated: Secondary | ICD-10-CM | POA: Diagnosis present

## 2013-02-07 DIAGNOSIS — J209 Acute bronchitis, unspecified: Principal | ICD-10-CM | POA: Diagnosis present

## 2013-02-07 DIAGNOSIS — N179 Acute kidney failure, unspecified: Secondary | ICD-10-CM | POA: Diagnosis present

## 2013-02-07 DIAGNOSIS — I2510 Atherosclerotic heart disease of native coronary artery without angina pectoris: Secondary | ICD-10-CM | POA: Diagnosis present

## 2013-02-07 DIAGNOSIS — E7800 Pure hypercholesterolemia, unspecified: Secondary | ICD-10-CM | POA: Diagnosis present

## 2013-02-07 LAB — TROPONIN I: Troponin I: <0.3 ng/mL (ref <0.30)

## 2013-02-07 LAB — CBC WITH DIFFERENTIAL/PLATELET
Eosinophils Absolute: 0.5 10*3/uL (ref 0.0–0.7)
Eosinophils Relative: 3 % (ref 0–5)
Hemoglobin: 11.4 g/dL — ABNORMAL LOW (ref 13.0–17.0)
Lymphs Abs: 3.1 10*3/uL (ref 0.7–4.0)
MCH: 32 pg (ref 26.0–34.0)
MCHC: 33.5 g/dL (ref 30.0–36.0)
MCV: 95.5 fL (ref 78.0–100.0)
Monocytes Relative: 15 % — ABNORMAL HIGH (ref 3–12)
RBC: 3.56 MIL/uL — ABNORMAL LOW (ref 4.22–5.81)

## 2013-02-07 LAB — COMPREHENSIVE METABOLIC PANEL
BUN: 31 mg/dL — ABNORMAL HIGH (ref 6–23)
Calcium: 9.4 mg/dL (ref 8.4–10.5)
Creatinine, Ser: 1.51 mg/dL — ABNORMAL HIGH (ref 0.50–1.35)
GFR calc Af Amer: 46 mL/min — ABNORMAL LOW (ref >90)
Glucose, Bld: 110 mg/dL — ABNORMAL HIGH (ref 70–99)
Total Protein: 8 g/dL (ref 6.0–8.3)

## 2013-02-07 LAB — GLUCOSE, CAPILLARY: Glucose-Capillary: 90 mg/dL (ref 70–99)

## 2013-02-07 MED ORDER — ALBUTEROL SULFATE (5 MG/ML) 0.5% IN NEBU
5.0000 mg | INHALATION_SOLUTION | Freq: Once | RESPIRATORY_TRACT | Status: AC
  Administered 2013-02-07: 5 mg via RESPIRATORY_TRACT
  Filled 2013-02-07: qty 1

## 2013-02-07 MED ORDER — LEVOFLOXACIN IN D5W 500 MG/100ML IV SOLN
500.0000 mg | Freq: Once | INTRAVENOUS | Status: AC
  Administered 2013-02-07: 23:00:00 500 mg via INTRAVENOUS
  Filled 2013-02-07: qty 100

## 2013-02-07 MED ORDER — IPRATROPIUM BROMIDE 0.02 % IN SOLN
0.5000 mg | Freq: Once | RESPIRATORY_TRACT | Status: AC
  Administered 2013-02-07: 0.5 mg via RESPIRATORY_TRACT
  Filled 2013-02-07: qty 2.5

## 2013-02-07 MED ORDER — DILTIAZEM HCL ER COATED BEADS 180 MG PO CP24
180.0000 mg | ORAL_CAPSULE | Freq: Every day | ORAL | Status: DC
  Administered 2013-02-08 – 2013-02-09 (×2): 180 mg via ORAL
  Filled 2013-02-07 (×2): qty 1

## 2013-02-07 MED ORDER — INSULIN ASPART 100 UNIT/ML ~~LOC~~ SOLN
0.0000 [IU] | Freq: Three times a day (TID) | SUBCUTANEOUS | Status: DC
  Administered 2013-02-08: 12:00:00 3 [IU] via SUBCUTANEOUS
  Administered 2013-02-08: 7 [IU] via SUBCUTANEOUS
  Administered 2013-02-08: 3 [IU] via SUBCUTANEOUS
  Administered 2013-02-09 (×2): 2 [IU] via SUBCUTANEOUS

## 2013-02-07 MED ORDER — ONDANSETRON HCL 4 MG/2ML IJ SOLN: 4.0000 mg | Freq: Four times a day (QID) | INTRAMUSCULAR | Status: DC | PRN

## 2013-02-07 MED ORDER — DUTASTERIDE 0.5 MG PO CAPS
0.5000 mg | ORAL_CAPSULE | Freq: Every day | ORAL | Status: DC
  Administered 2013-02-08 – 2013-02-09 (×2): 0.5 mg via ORAL
  Filled 2013-02-07 (×2): qty 1

## 2013-02-07 MED ORDER — ASPIRIN EC 81 MG PO TBEC
81.0000 mg | DELAYED_RELEASE_TABLET | Freq: Every day | ORAL | Status: DC
  Administered 2013-02-08 – 2013-02-09 (×2): 81 mg via ORAL
  Filled 2013-02-07 (×2): qty 1

## 2013-02-07 MED ORDER — NABUMETONE 500 MG PO TABS
500.0000 mg | ORAL_TABLET | Freq: Two times a day (BID) | ORAL | Status: DC
  Administered 2013-02-07 – 2013-02-09 (×4): 500 mg via ORAL
  Filled 2013-02-07 (×5): qty 1

## 2013-02-07 MED ORDER — LEVOFLOXACIN IN D5W 250 MG/50ML IV SOLN: 250.0000 mg | INTRAVENOUS | Status: DC

## 2013-02-07 MED ORDER — LEVOFLOXACIN 500 MG PO TABS
750.0000 mg | ORAL_TABLET | Freq: Once | ORAL | Status: AC
  Administered 2013-02-07: 750 mg via ORAL
  Filled 2013-02-07: qty 2

## 2013-02-07 MED ORDER — LEVOFLOXACIN IN D5W 500 MG/100ML IV SOLN: 500.0000 mg | INTRAVENOUS | Status: DC

## 2013-02-07 MED ORDER — CLOPIDOGREL BISULFATE 75 MG PO TABS
75.0000 mg | ORAL_TABLET | Freq: Every day | ORAL | Status: DC
  Administered 2013-02-08 – 2013-02-09 (×2): 75 mg via ORAL
  Filled 2013-02-07 (×2): qty 1

## 2013-02-07 MED ORDER — ACETAMINOPHEN 500 MG PO TABS
1000.0000 mg | ORAL_TABLET | Freq: Two times a day (BID) | ORAL | Status: DC
  Administered 2013-02-07 – 2013-02-09 (×4): 1000 mg via ORAL
  Filled 2013-02-07 (×5): qty 2

## 2013-02-07 MED ORDER — POLYETHYLENE GLYCOL 3350 17 G PO PACK
17.0000 g | PACK | Freq: Every day | ORAL | Status: DC | PRN
  Filled 2013-02-07: qty 1

## 2013-02-07 MED ORDER — DARIFENACIN HYDROBROMIDE ER 7.5 MG PO TB24
7.5000 mg | ORAL_TABLET | Freq: Every day | ORAL | Status: DC
  Administered 2013-02-08 – 2013-02-09 (×2): 7.5 mg via ORAL
  Filled 2013-02-07 (×2): qty 1

## 2013-02-07 MED ORDER — ALLOPURINOL 100 MG PO TABS
100.0000 mg | ORAL_TABLET | Freq: Every day | ORAL | Status: DC
  Administered 2013-02-08 – 2013-02-09 (×2): 100 mg via ORAL
  Filled 2013-02-07 (×2): qty 1

## 2013-02-07 MED ORDER — IPRATROPIUM BROMIDE 0.02 % IN SOLN
0.5000 mg | Freq: Once | RESPIRATORY_TRACT | Status: DC
  Filled 2013-02-07: qty 2.5

## 2013-02-07 MED ORDER — SODIUM CHLORIDE 0.9 % IV BOLUS (SEPSIS)
500.0000 mL | Freq: Once | INTRAVENOUS | Status: AC
  Administered 2013-02-07: 500 mL via INTRAVENOUS

## 2013-02-07 MED ORDER — ACETAMINOPHEN 325 MG PO TABS: 650.0000 mg | ORAL_TABLET | Freq: Four times a day (QID) | ORAL | Status: DC | PRN

## 2013-02-07 MED ORDER — SODIUM CHLORIDE 0.9 % IV SOLN
INTRAVENOUS | Status: AC
  Administered 2013-02-07: 23:00:00 via INTRAVENOUS

## 2013-02-07 MED ORDER — IPRATROPIUM BROMIDE 0.02 % IN SOLN
0.5000 mg | Freq: Four times a day (QID) | RESPIRATORY_TRACT | Status: DC
  Administered 2013-02-07 – 2013-02-08 (×2): 0.5 mg via RESPIRATORY_TRACT
  Filled 2013-02-07: qty 2.5

## 2013-02-07 MED ORDER — ALBUTEROL SULFATE (5 MG/ML) 0.5% IN NEBU
5.0000 mg | INHALATION_SOLUTION | RESPIRATORY_TRACT | Status: DC | PRN
  Filled 2013-02-07: qty 1

## 2013-02-07 MED ORDER — DILTIAZEM HCL ER BEADS 180 MG PO CP24: 180.0000 mg | ORAL_CAPSULE | Freq: Every day | ORAL | Status: DC

## 2013-02-07 MED ORDER — ALBUTEROL SULFATE (5 MG/ML) 0.5% IN NEBU
5.0000 mg | INHALATION_SOLUTION | Freq: Four times a day (QID) | RESPIRATORY_TRACT | Status: DC
  Administered 2013-02-07 – 2013-02-08 (×2): 5 mg via RESPIRATORY_TRACT
  Filled 2013-02-07 (×2): qty 1

## 2013-02-07 MED ORDER — PANTOPRAZOLE SODIUM 20 MG PO TBEC
20.0000 mg | DELAYED_RELEASE_TABLET | Freq: Every day | ORAL | Status: DC
  Administered 2013-02-08 – 2013-02-09 (×2): 20 mg via ORAL
  Filled 2013-02-07 (×2): qty 1

## 2013-02-07 MED ORDER — OCUVITE PO TABS
1.0000 | ORAL_TABLET | Freq: Every day | ORAL | Status: DC
  Administered 2013-02-08 – 2013-02-09 (×2): 1 via ORAL
  Filled 2013-02-07 (×2): qty 1

## 2013-02-07 MED ORDER — METHYLPREDNISOLONE SODIUM SUCC 40 MG IJ SOLR
40.0000 mg | Freq: Four times a day (QID) | INTRAMUSCULAR | Status: DC
  Administered 2013-02-07 – 2013-02-08 (×2): 40 mg via INTRAVENOUS
  Filled 2013-02-07 (×6): qty 1
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
## 2013-02-07 NOTE — ED Notes (Signed)
Pt started having chest congestion, non productive cough and Shob that started yesterday morning. Pt denies having any fevers.
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
## 2013-02-07 NOTE — ED Notes (Signed)
PA Lauren notified that patient was unable to stand/walk. Myself and another NT attempted to ambulate patient and he was unable to sit on the side of the bed. Patient's O2 saturation dropped to 89% ra.

## 2013-02-08 LAB — GLUCOSE, CAPILLARY
Glucose-Capillary: 205 mg/dL — ABNORMAL HIGH (ref 70–99)
Glucose-Capillary: 244 mg/dL — ABNORMAL HIGH (ref 70–99)

## 2013-02-08 LAB — CBC
HCT: 28.8 % — ABNORMAL LOW (ref 39.0–52.0)
MCH: 31 pg (ref 26.0–34.0)
MCHC: 33 g/dL (ref 30.0–36.0)
Platelets: 198 10*3/uL (ref 150–400)
RBC: 3.06 MIL/uL — ABNORMAL LOW (ref 4.22–5.81)
RDW: 13.5 % (ref 11.5–15.5)

## 2013-02-08 LAB — BASIC METABOLIC PANEL
Calcium: 8.5 mg/dL (ref 8.4–10.5)
GFR calc Af Amer: 46 mL/min — ABNORMAL LOW (ref >90)
GFR calc non Af Amer: 40 mL/min — ABNORMAL LOW (ref >90)
Sodium: 134 mEq/L — ABNORMAL LOW (ref 135–145)

## 2013-02-08 MED ORDER — ALBUTEROL SULFATE (5 MG/ML) 0.5% IN NEBU
2.5000 mg | INHALATION_SOLUTION | Freq: Four times a day (QID) | RESPIRATORY_TRACT | Status: DC
  Administered 2013-02-08 – 2013-02-09 (×3): 2.5 mg via RESPIRATORY_TRACT
  Filled 2013-02-08 (×3): qty 0.5

## 2013-02-08 MED ORDER — SODIUM POLYSTYRENE SULFONATE 15 GM/60ML PO SUSP
15.0000 g | Freq: Once | ORAL | Status: AC
  Administered 2013-02-08: 15 g via ORAL
  Filled 2013-02-08: qty 60

## 2013-02-08 MED ORDER — GLUCERNA SHAKE PO LIQD
237.0000 mL | Freq: Two times a day (BID) | ORAL | Status: DC
  Administered 2013-02-08 – 2013-02-09 (×2): 237 mL via ORAL
  Filled 2013-02-08 (×3): qty 237

## 2013-02-08 MED ORDER — IPRATROPIUM BROMIDE 0.02 % IN SOLN
0.5000 mg | Freq: Four times a day (QID) | RESPIRATORY_TRACT | Status: DC
  Administered 2013-02-08 – 2013-02-09 (×3): 0.5 mg via RESPIRATORY_TRACT
  Filled 2013-02-08 (×3): qty 2.5

## 2013-02-08 MED ORDER — LEVOFLOXACIN 250 MG PO TABS
250.0000 mg | ORAL_TABLET | Freq: Every day | ORAL | Status: DC
  Administered 2013-02-08 – 2013-02-09 (×2): 250 mg via ORAL
  Filled 2013-02-08 (×2): qty 1

## 2013-02-08 MED ORDER — PREDNISONE 20 MG PO TABS
40.0000 mg | ORAL_TABLET | Freq: Every day | ORAL | Status: DC
  Administered 2013-02-08 – 2013-02-09 (×2): 40 mg via ORAL
  Filled 2013-02-08 (×3): qty 2
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
## 2013-02-08 NOTE — Evaluation (Signed)
Physical Therapy Evaluation Patient Details Name: KYREL LEIGHTON MRN: [id number] DOB: 01-14-1924 Today's Date: 02/08/2013 Time: 1478-2956 PT Time Calculation (min): 27 min  PT Assessment / Plan / Recommendation History of Present Illness  79 yo male admitted with bronchitis. Hx of DM, CABG, dementia.   Clinical Impression  On eval, pt required Min assist for mobility-able to ambulate ~100 feet with walker. Requiring increased assistance at this time (was mobilizing at supervision level per wife PTA) however wife states she feels she can manage with pt at home. Discussed HHPT however wife declined this. Plan is for d/c home on tomorrow.     PT Assessment  Patient needs continued PT services    Follow Up Recommendations  No PT follow up (wife declines HHPT-feels pt will be fine)    Does the patient have the potential to tolerate intense rehabilitation      Barriers to Discharge        Equipment Recommendations  None recommended by PT    Recommendations for Other Services     Frequency Min 3X/week    Precautions / Restrictions Precautions Precautions: Fall Restrictions Weight Bearing Restrictions: No   Pertinent Vitals/Pain bil LEs "sore/achy"-unrated      Mobility  Bed Mobility Bed Mobility: Supine to Sit;Sit to Supine Supine to Sit: 4: Min assist Sit to Supine: 4: Min assist Details for Bed Mobility Assistance: Assist for trunk and bil LEs. Increased time. Multimodal cues for safety, technqiue, completion of task Transfers Transfers: Sit to Stand;Stand to Sit Sit to Stand: 4: Min assist;From bed Stand to Sit: 4: Min assist;To bed Details for Transfer Assistance: Assist to rise, stabilie, control descent. Multimodal cues for safety, technique, hand placement. Pt tends to want to pull up on walker.  Ambulation/Gait Ambulation/Gait Assistance: 4: Min guard;4: Min Environmental consultant (Feet): 100 Feet Assistive device: Rolling walker Ambulation/Gait Assistance  Details: Min assist for turns, otherwise min guard assist. Multimodal cues for safety, distance from RW, direction. slow gait speed. Pt c/o bil LE soreness Gait Pattern: Decreased step length - right;Decreased step length - left;Shuffle;Decreased stride length    Exercises     PT Diagnosis: Generalized weakness;Difficulty walking  PT Problem List: Decreased strength;Decreased activity tolerance;Decreased mobility;Decreased knowledge of use of DME;Decreased cognition PT Treatment Interventions: DME instruction;Gait training;Functional mobility training;Therapeutic activities;Therapeutic exercise;Patient/family education     PT Goals(Current goals can be found in the care plan section) Acute Rehab PT Goals Patient Stated Goal: wife states plan is for home PT Goal Formulation: With family Time For Goal Achievement: 02/22/13 Potential to Achieve Goals: Good  Visit Information  Last PT Received On: 02/08/13 Assistance Needed: +1 History of Present Illness: 79 yo male admitted with bronchitis. Hx of DM, CABG, dementia.        Prior Functioning  Home Living Family/patient expects to be discharged to:: Private residence Living Arrangements: Spouse/significant other Type of Home: House Home Access: Stairs to enter [employer] of Steps: 1 Home Equipment: Environmental consultant - 2 wheels;Shower seat;Bedside commode;Hand held shower head;Cane - single point Additional Comments: walk in shower, one step up in the house. siderail for bed Prior Function Level of Independence: Independent with assistive device(s);Needs assistance Gait / Transfers Assistance Needed: ambulates household distances with walker. Pt also goes to adult daycare 9-4 ADL's / Homemaking Assistance Needed: wife provides supervision for ADLs and assists with lower body Communication Communication: No difficulties    Cognition  Cognition Arousal/Alertness: Awake/alert Behavior During Therapy: WFL for tasks  assessed/performed Overall Cognitive Status:  History of cognitive impairments - at baseline    Extremity/Trunk Assessment Upper Extremity Assessment Upper Extremity Assessment: Generalized weakness Lower Extremity Assessment Lower Extremity Assessment: Generalized weakness Cervical / Trunk Assessment Cervical / Trunk Assessment: Normal   Balance    End of Session PT - End of Session Equipment Utilized During Treatment: Gait belt Activity Tolerance: Patient limited by pain;Patient limited by fatigue Patient left: in bed;with call bell/phone within reach;with family/visitor present  GP     Rebeca Alert, MPT Pager: [phone]

## 2013-02-08 NOTE — Progress Notes (Signed)
[facility] PROGRESS NOTE  YI HAUGAN ZOX:[id number] DOB: [birth year] DOA: 02/07/2013 PCP: Laurena Slimmer, MD  Assessment/Plan: Acute bronchitis with reactive airway disease - suspect underlying COPD given extensive smoking history, however patient quit in 1980.  - no wheezing this morning, improved respiratory status, change to oral agents.  Dementia - stable per wife DM - SSI HTN - hold ARB CAD h/o CABG and PCI - continue medical management  Diet: DYS 3 Fluids: none DVT Prophylaxis: SCD  Code Status: DNR Family Communication: wife bedside  Disposition Plan: home when ready, 12/5  Consultants:  none  Procedures:  none   Antibiotics  Anti-infectives   Start     Dose/Rate Route Frequency Ordered Stop   02/08/13 2200  Levofloxacin (LEVAQUIN) IVPB 250 mg  Status:  Discontinued     250 mg 50 mL/hr over 60 Minutes Intravenous Every 24 hours 02/07/13 2046 02/08/13 0908   02/08/13 1000  levofloxacin (LEVAQUIN) tablet 250 mg     250 mg Oral Daily 02/08/13 0908     02/07/13 2200  levofloxacin (LEVAQUIN) IVPB 500 mg     500 mg 100 mL/hr over 60 Minutes Intravenous  Once 02/07/13 2046 02/08/13 0000   02/07/13 2045  levofloxacin (LEVAQUIN) IVPB 500 mg  Status:  Discontinued     500 mg 100 mL/hr over 60 Minutes Intravenous Every 24 hours 02/07/13 2039 02/07/13 2045   02/07/13 1730  levofloxacin (LEVAQUIN) tablet 750 mg     750 mg Oral  Once 02/07/13 1729 02/07/13 1923     Antibiotics Given (last 72 hours)   Date/Time Action Medication Dose Rate   02/07/13 1923 Given   levofloxacin (LEVAQUIN) tablet 750 mg 750 mg    02/07/13 2300 Given   levofloxacin (LEVAQUIN) IVPB 500 mg 500 mg 100 mL/hr   02/08/13 8119 Given   levofloxacin (LEVAQUIN) tablet 250 mg 250 mg       HPI/Subjective: -  Confused, without complaints  Objective: Filed Vitals:   02/07/13 1746 02/07/13 1955 02/08/13 0549 02/08/13 0855  BP: 138/46 86/63 118/54   Pulse: 74 155 80   Temp:  98.8 F  (37.1 C) 97.6 F (36.4 C)   TempSrc:  Oral Oral   Resp: 20 25 22    Height:  5\' 6"  (1.676 m)    Weight:  89.5 kg (197 lb 5 oz)    SpO2: 98% 98% 97% 98%    Intake/Output Summary (Last 24 hours) at 02/08/13 1256 Last data filed at 02/08/13 0700  Gross per 24 hour  Intake  632.5 ml  Output    325 ml  Net  307.5 ml   Filed Weights   02/07/13 1955  Weight: 89.5 kg (197 lb 5 oz)    Exam:   General:  NAD  Cardiovascular: regular rate and rhythm, without MRG  Respiratory: good air movement, clear to auscultation throughout, no wheezing, ronchi or rales  Abdomen: soft, not tender to palpation, positive bowel sounds  MSK: no peripheral edema  Neuro: not participating but without gross focal findings  Data Reviewed: Basic Metabolic Panel:  Recent Labs Lab 02/07/13 1623 02/08/13 0357  NA 136 134*  K 4.3 5.2*  CL 101 102  CO2 23 20  GLUCOSE 110* 171*  BUN 31* 35*  CREATININE 1.51* 1.50*  CALCIUM 9.4 8.5   Liver Function Tests:  Recent Labs Lab 02/07/13 1623  AST 18  ALT 13  ALKPHOS 81  BILITOT 0.4  PROT 8.0  ALBUMIN 3.7   No results  found for this basename: LIPASE, AMYLASE,  in the last 168 hours No results found for this basename: AMMONIA,  in the last 168 hours CBC:  Recent Labs Lab 02/07/13 1623 02/08/13 0357  WBC 15.1* 13.3*  NEUTROABS 9.2*  --   HGB 11.4* 9.5*  HCT 34.0* 28.8*  MCV 95.5 94.1  PLT 248 198   Cardiac Enzymes:  Recent Labs Lab 02/07/13 1623 02/07/13 2150  TROPONINI <0.30 <0.30   BNP (last 3 results) No results found for this basename: PROBNP,  in the last 8760 hours CBG:  Recent Labs Lab 02/07/13 2337 02/08/13 0737 02/08/13 1134  GLUCAP 90 205* 244*    No results found for this or any previous visit (from the past 240 hour(s)).   Studies: Dg Chest 2 View  02/07/2013   CLINICAL DATA:  Short of breath, cough  EXAM: CHEST  2 VIEW  COMPARISON:  01/29/2013  FINDINGS: Prior CABG. Negative for heart failure. Negative  for pneumonia. Lungs remain clear.  IMPRESSION: No active cardiopulmonary disease.   Electronically Signed   By: Marlan Palau M.D.   On: 02/07/2013 16:08    Scheduled Meds: . acetaminophen  1,000 mg Oral BID  . albuterol  2.5 mg Nebulization Q6H WA  . allopurinol  100 mg Oral Daily  . aspirin EC  81 mg Oral Daily  . beta carotene w/minerals  1 tablet Oral Daily  . clopidogrel  75 mg Oral Daily  . darifenacin  7.5 mg Oral Daily  . diltiazem  180 mg Oral Daily  . dutasteride  0.5 mg Oral Daily  . feeding supplement (GLUCERNA SHAKE)  237 mL Oral BID BM  . insulin aspart  0-9 Units Subcutaneous TID WC  . ipratropium  0.5 mg Nebulization Once  . ipratropium  0.5 mg Nebulization Q6H WA  . levofloxacin  250 mg Oral Daily  . nabumetone  500 mg Oral BID  . pantoprazole  20 mg Oral Daily  . predniSONE  40 mg Oral Q breakfast   Continuous Infusions:   Principal Problem:   Bronchitis Active Problems:   DM (diabetes mellitus)   Dementia   CAD S/P percutaneous coronary angioplasty   Chronic diastolic CHF (congestive heart failure), NYHA class 2   Reactive airway disease   Bronchitis with bronchospasm  Time spent: 25  Pamella Pert, MD [facility] Pager [phone]. If 7 PM - 7 AM, please contact night-coverage at [email or web address], password [facility] 02/08/2013, 12:56 PM  LOS: 1 day

## 2013-02-09 LAB — BASIC METABOLIC PANEL
BUN: 46 mg/dL — ABNORMAL HIGH (ref 6–23)
Calcium: 8.6 mg/dL (ref 8.4–10.5)
Creatinine, Ser: 1.54 mg/dL — ABNORMAL HIGH (ref 0.50–1.35)
GFR calc Af Amer: 45 mL/min — ABNORMAL LOW (ref >90)
GFR calc non Af Amer: 39 mL/min — ABNORMAL LOW (ref >90)

## 2013-02-09 LAB — GLUCOSE, CAPILLARY: Glucose-Capillary: 194 mg/dL — ABNORMAL HIGH (ref 70–99)

## 2013-02-09 MED ORDER — GUAIFENESIN ER 600 MG PO TB12: 600.0000 mg | ORAL_TABLET | Freq: Two times a day (BID) | ORAL | Status: DC | PRN

## 2013-02-09 MED ORDER — LEVOFLOXACIN 250 MG PO TABS: 250.0000 mg | ORAL_TABLET | Freq: Every day | ORAL | Status: DC

## 2013-02-09 MED ORDER — PREDNISONE 20 MG PO TABS: 40.0000 mg | ORAL_TABLET | Freq: Every day | ORAL | Status: DC

## 2013-02-09 MED ORDER — VALSARTAN 160 MG PO TABS: 80.0000 mg | ORAL_TABLET | Freq: Every day | ORAL | Status: DC

## 2013-02-09 MED ORDER — INFLUENZA VAC SPLIT QUAD 0.5 ML IM SUSP: 0.5000 mL | INTRAMUSCULAR | Status: DC

## 2013-02-09 MED ORDER — GUAIFENESIN ER 600 MG PO TB12
600.0000 mg | ORAL_TABLET | Freq: Two times a day (BID) | ORAL | Status: DC
Start: 2013-02-09 — End: 2013-02-09
  Administered 2013-02-09: 12:00:00 600 mg via ORAL
  Filled 2013-02-09 (×3): qty 1

## 2013-02-09 NOTE — Progress Notes (Signed)
Physical Therapy Treatment Patient Details Name: Albert Nelson MRN: [id number] DOB: [birth year] Today's Date: 02/09/2013 Time: 4098-1191 PT Time Calculation (min): 18 min  PT Assessment / Plan / Recommendation  History of Present Illness 79 yo male admitted with bronchitis. Hx of DM, CABG, dementia.    PT Comments   Continues to require Min assist for mobility, balance. Feel HHPT would be beneficial however wife declined this on yesterday.   Follow Up Recommendations  No PT follow up (wife declines HHPT-feels pt will be fine)     Does the patient have the potential to tolerate intense rehabilitation     Barriers to Discharge        Equipment Recommendations  None recommended by PT    Recommendations for Other Services    Frequency Min 3X/week   Progress towards PT Goals Progress towards PT goals: Progressing toward goals  Plan Current plan remains appropriate    Precautions / Restrictions Precautions Precautions: Fall Restrictions Weight Bearing Restrictions: No   Pertinent Vitals/Pain bil LE "achy"-unrated.     Mobility  Bed Mobility Bed Mobility: Not assessed Details for Bed Mobility Assistance: pt sitting in recliner Transfers Transfers: Sit to Stand;Stand to Sit Sit to Stand: 4: Min assist;From chair/3-in-1 Stand to Sit: 4: Min assist;To chair/3-in-1 Details for Transfer Assistance: Assist to rise, stabilie, control descent. Multimodal cues for safety, technique, hand placement. Ambulation/Gait Ambulation/Gait Assistance: 4: Min guard;4: Min Environmental consultant (Feet): 105 Feet Assistive device: Rolling walker Ambulation/Gait Assistance Details: continues to need min assist for turns, otherwise min guard. Cues for safety, step length, direction. Slow gait speed and pt continues to c/o bil LE soreness.  Gait Pattern: Shuffle;Decreased step length - right;Decreased step length - left    Exercises     PT Diagnosis:    PT Problem List:   PT Treatment  Interventions:     PT Goals (current goals can now be found in the care plan section)    Visit Information  Last PT Received On: 02/09/13 Assistance Needed: +1 History of Present Illness: 79 yo male admitted with bronchitis. Hx of DM, CABG, dementia.     Subjective Data      Cognition  Cognition Arousal/Alertness: Awake/alert Behavior During Therapy: WFL for tasks assessed/performed Overall Cognitive Status: History of cognitive impairments - at baseline    Balance     End of Session PT - End of Session Equipment Utilized During Treatment: Gait belt Activity Tolerance: Patient tolerated treatment well Patient left: in chair;with call bell/phone within reach   GP     Rebeca Alert, MPT Pager: [phone]

## 2013-02-09 NOTE — Discharge Summary (Signed)
Physician Discharge Summary  Albert Nelson ZOX:[id number] DOB: 1923-10-13 DOA: 02/07/2013  PCP: Laurena Slimmer, MD  Admit date: 02/07/2013 Discharge date: 02/09/2013  Time spent: 35 minutes  Recommendations for Outpatient Follow-up:  1. Followup with PCP in 1 week   Recommendations for primary care physician for things to follow:  Monitor respiratory status Repeat BMP  Discharge Diagnoses:  Principal Problem:   Bronchitis Active Problems:   DM (diabetes mellitus)   Dementia   CAD S/P percutaneous coronary angioplasty   Chronic diastolic CHF (congestive heart failure), NYHA class 2   Reactive airway disease   Bronchitis with bronchospasm   CKD (chronic kidney disease) stage 3, GFR 30-59 ml/min  Discharge Condition: Stable  Diet recommendation: Heart healthy  Filed Weights   02/07/13 1955  Weight: 89.5 kg (197 lb 5 oz)    History of present illness:  Albert Nelson is a 79 y.o. male with past medical history of CAD/CABG, diabetes, hypertension, dementia, former smoker was brought to the ER by his wife today with increased cough, congestion and wheezing for 2 days. Most of the history is provided by his wife, she she reports patient feeling feverish and warm today, no chills.  No leg swelling, orthopnea or PND.  About 9 days ago he was seen in the emergency room her cough with hemoptysis, CTA chest was negative for PE or masses and subsequently discharged home, his hemoptysis has since improved although still intermittent.  In the ER today, chest x-ray was unremarkable, the patient noted to be hypoxemic with ambulation and hence [facility] consulted for further evaluation and management  Hospital Course:  Acute bronchitis with reactive airway disease - suspect underlying COPD given extensive smoking history, however patient quit in 1980. Patient initially short of breath, and wheezing. He responded really well to IV steroids antibiotics and breathing treatments, and was transitioned to  oral agents on hospital day 2. Hospital day 3 he was able to ambulate in the hallway on room air, without any shortness or breath chest pain or any other complaints. He was discharged to finish his antibiotics course as an outpatient, and with a short prednisone taper. Dementia - stable per wife  DM - resume home medications HTN - his ARB was held while hospitalized, his blood pressure was stable. His dose was decreased to half due to his CKD and good blood pressure control.  CAD h/o CABG and PCI - continue medical management. To followup with cardiology as an outpatient CKD - creatinine stable at 1.5.   Procedures:  none   Consultations:  none  Discharge Exam: Filed Vitals:   02/08/13 2008 02/08/13 2307 02/09/13 0521 02/09/13 0845  BP:  111/46 111/48   Pulse:  87 67   Temp:  98.4 F (36.9 C) 98.3 F (36.8 C)   TempSrc:  Oral Oral   Resp:  20 20   Height:      Weight:      SpO2: 96% 95% 96% 95%    General: No acute distress Cardiovascular: Regular in rhythm Respiratory: Clear to auscultation bilaterally, no wheezing  Discharge Instructions     Medication List         acetaminophen 500 MG tablet  Commonly known as:  TYLENOL  Take 2 tablets (1,000 mg total) by mouth 2 (two) times daily.     allopurinol 100 MG tablet  Commonly known as:  ZYLOPRIM  Take 100 mg by mouth daily.     aspirin EC 81 MG tablet  Take  81 mg by mouth daily.     beta carotene w/minerals tablet  Take 1 tablet by mouth daily.     CITRUCEL PO  Take 2 tablets by mouth daily.     clopidogrel 75 MG tablet  Commonly known as:  PLAVIX  Take 75 mg by mouth daily.     diltiazem 180 MG 24 hr capsule  Commonly known as:  TIAZAC  Take 180 mg by mouth daily.     dutasteride 0.5 MG capsule  Commonly known as:  AVODART  Take 0.5 mg by mouth daily.     Fish Oil 1200 MG Caps  Take 1 capsule by mouth daily.     glimepiride 4 MG tablet  Commonly known as:  AMARYL  Take 4 mg by mouth daily  before breakfast.     guaiFENesin 600 MG 12 hr tablet  Commonly known as:  MUCINEX  Take 1 tablet (600 mg total) by mouth 2 (two) times daily as needed for cough.     lansoprazole 30 MG capsule  Commonly known as:  PREVACID  Take 30 mg by mouth daily.     levofloxacin 250 MG tablet  Commonly known as:  LEVAQUIN  Take 1 tablet (250 mg total) by mouth daily.     nabumetone 500 MG tablet  Commonly known as:  RELAFEN  Take 500 mg by mouth 2 (two) times daily.     predniSONE 20 MG tablet  Commonly known as:  DELTASONE  Take 2 tablets (40 mg total) by mouth daily with breakfast. 2 tabs x 1 day then 1 tab x 2 days hen 0.5 tabs x 2 days then stop.     rosuvastatin 10 MG tablet  Commonly known as:  CRESTOR  Take 10 mg by mouth daily.     solifenacin 10 MG tablet  Commonly known as:  VESICARE  Take 10 mg by mouth daily.     valsartan 160 MG tablet  Commonly known as:  DIOVAN  Take 0.5 tablets (80 mg total) by mouth daily.           Follow-up Information   Follow up with Laurena Slimmer, MD. Schedule an appointment as soon as possible for a visit in 1 week.   Specialty:  Internal Medicine   Contact information:   [address] [place] [address] [phone]       Follow up with HARDING,DAVID W, MD. Schedule an appointment as soon as possible for a visit in 2 weeks.   Specialty:  Cardiology   Contact information:   [address] [place] [address] [phone]       The results of significant diagnostics from this hospitalization (including imaging, microbiology, ancillary and laboratory) are listed below for reference.    Significant Diagnostic Studies: Dg Chest 2 View  02/07/2013   CLINICAL DATA:  Short of breath, cough  EXAM: CHEST  2 VIEW  COMPARISON:  01/29/2013  FINDINGS: Prior CABG. Negative for heart failure. Negative for pneumonia. Lungs remain clear.  IMPRESSION: No active cardiopulmonary disease.   Electronically Signed    By: Marlan Palau M.D.   On: 02/07/2013 16:08   Dg Chest 2 View  01/29/2013   CLINICAL DATA:  Shortness of breath, chest pain.  EXAM: CHEST  2 VIEW  COMPARISON:  March 18, 2012.  FINDINGS: Status post coronary artery bypass graft. No pneumothorax or pleural effusion is noted. Cardiomediastinal silhouette is normal. No acute pulmonary disease is noted. Bony thorax  is intact.  IMPRESSION: No acute cardiopulmonary abnormality seen.   Electronically Signed   By: Roque Lias M.D.   On: 01/29/2013 12:53   Ct Angio Chest Pe W/cm &/or Wo Cm  01/29/2013   CLINICAL DATA:  Proptosis and shortness of breath.  EXAM: CT ANGIOGRAPHY CHEST WITH CONTRAST  TECHNIQUE: Multidetector CT imaging of the chest was performed using the standard protocol during bolus administration of intravenous contrast. Multiplanar CT image reconstructions including MIPs were obtained to evaluate the vascular anatomy.  CONTRAST:  80mL OMNIPAQUE IOHEXOL 350 MG/ML SOLN  COMPARISON:  Abdomen and pelvis CT from 02/27/2008.  FINDINGS: There is no filling defect within the opacified pulmonary arteries to suggest the presence of an acute pulmonary embolus. No thoracic aortic aneurysm. Although the thoracic aorta is not well opacified, there is no discernible dissection flap within the lumen.  No axillary lymphadenopathy. No mediastinal or hilar lymphadenopathy. Heart size is upper normal. Patient is status post CABG. There is no pericardial or pleural effusion.  3 mm right lower lobe pulmonary nodule is visualized on image 43. No pulmonary edema. No focal airspace consolidation. No pneumothorax.  Images which include the upper abdomen show 1.7 cm lesion in the spleen. This is decreased from 2.6 cm on the previous CT scan, consistent with a benign process. Liver contour appears slightly nodular on today's study raising the question of, but not diagnostic for cirrhosis.  Review of the MIP images confirms the above findings.  IMPRESSION: No CT evidence  for acute pulmonary embolus. No findings to explain the patient's history of hemoptysis and shortness of breath.  3 mm right lower lobe pulmonary nodule. If the patient is at high risk for bronchogenic carcinoma, follow-up chest CT at 1year is recommended. If the patient is at low risk, no follow-up is needed. This recommendation follows the consensus statement: Guidelines for Management of Small Pulmonary Nodules Detected on CT Scans: A Statement from the Fleischner Society as published in Radiology 2005; 237:395-400.   Electronically Signed   By: Kennith Center M.D.   On: 01/29/2013 15:28   Labs: Basic Metabolic Panel:  Recent Labs Lab 02/07/13 1623 02/08/13 0357 02/09/13 0504  NA 136 134* 138  K 4.3 5.2* 4.3  CL 101 102 106  CO2 23 20 21   GLUCOSE 110* 171* 229*  BUN 31* 35* 46*  CREATININE 1.51* 1.50* 1.54*  CALCIUM 9.4 8.5 8.6   Liver Function Tests:  Recent Labs Lab 02/07/13 1623  AST 18  ALT 13  ALKPHOS 81  BILITOT 0.4  PROT 8.0  ALBUMIN 3.7   CBC:  Recent Labs Lab 02/07/13 1623 02/08/13 0357  WBC 15.1* 13.3*  NEUTROABS 9.2*  --   HGB 11.4* 9.5*  HCT 34.0* 28.8*  MCV 95.5 94.1  PLT 248 198   Cardiac Enzymes:  Recent Labs Lab 02/07/13 1623 02/07/13 2150  TROPONINI <0.30 <0.30   CBG:  Recent Labs Lab 02/08/13 1134 02/08/13 1730 02/08/13 2309 02/09/13 0745 02/09/13 1133  GLUCAP 244* 320* 218* 161* 194*     Signed:  Demetri Goshert  [facility] 02/09/2013, 5:14 PM

## 2013-02-10 ENCOUNTER — Inpatient Hospital Stay (HOSPITAL_COMMUNITY)
Admission: EM | Admit: 2013-02-10 | Discharge: 2013-02-14 | DRG: 605 | Disposition: A | Discharge status: Skilled Nursing Facility | Payer: PRIVATE HEALTH INSURANCE | Attending: Internal Medicine | Admitting: Internal Medicine

## 2013-02-10 ENCOUNTER — Emergency Department (HOSPITAL_COMMUNITY): Payer: PRIVATE HEALTH INSURANCE

## 2013-02-10 ENCOUNTER — Encounter (HOSPITAL_COMMUNITY): Payer: Self-pay | Admitting: Emergency Medicine

## 2013-02-10 DIAGNOSIS — I10 Essential (primary) hypertension: Secondary | ICD-10-CM

## 2013-02-10 DIAGNOSIS — K219 Gastro-esophageal reflux disease without esophagitis: Secondary | ICD-10-CM

## 2013-02-10 DIAGNOSIS — S301XXD Contusion of abdominal wall, subsequent encounter: Secondary | ICD-10-CM

## 2013-02-10 DIAGNOSIS — R5381 Other malaise: Secondary | ICD-10-CM | POA: Diagnosis present

## 2013-02-10 DIAGNOSIS — E7800 Pure hypercholesterolemia, unspecified: Secondary | ICD-10-CM | POA: Diagnosis present

## 2013-02-10 DIAGNOSIS — N390 Urinary tract infection, site not specified: Secondary | ICD-10-CM

## 2013-02-10 DIAGNOSIS — Z9861 Coronary angioplasty status: Secondary | ICD-10-CM

## 2013-02-10 DIAGNOSIS — S3011XA Contusion of abdominal wall, initial encounter: Principal | ICD-10-CM

## 2013-02-10 DIAGNOSIS — J45909 Unspecified asthma, uncomplicated: Secondary | ICD-10-CM

## 2013-02-10 DIAGNOSIS — N183 Chronic kidney disease, stage 3 unspecified: Secondary | ICD-10-CM

## 2013-02-10 DIAGNOSIS — J209 Acute bronchitis, unspecified: Secondary | ICD-10-CM

## 2013-02-10 DIAGNOSIS — W19XXXA Unspecified fall, initial encounter: Secondary | ICD-10-CM | POA: Diagnosis present

## 2013-02-10 DIAGNOSIS — J40 Bronchitis, not specified as acute or chronic: Secondary | ICD-10-CM

## 2013-02-10 DIAGNOSIS — R7309 Other abnormal glucose: Secondary | ICD-10-CM

## 2013-02-10 DIAGNOSIS — S3011XD Contusion of abdominal wall, subsequent encounter: Secondary | ICD-10-CM

## 2013-02-10 DIAGNOSIS — R55 Syncope and collapse: Secondary | ICD-10-CM

## 2013-02-10 DIAGNOSIS — Z8673 Personal history of transient ischemic attack (TIA), and cerebral infarction without residual deficits: Secondary | ICD-10-CM

## 2013-02-10 DIAGNOSIS — R531 Weakness: Secondary | ICD-10-CM

## 2013-02-10 DIAGNOSIS — R05 Cough: Secondary | ICD-10-CM

## 2013-02-10 DIAGNOSIS — I252 Old myocardial infarction: Secondary | ICD-10-CM

## 2013-02-10 DIAGNOSIS — R296 Repeated falls: Secondary | ICD-10-CM

## 2013-02-10 DIAGNOSIS — Z87891 Personal history of nicotine dependence: Secondary | ICD-10-CM

## 2013-02-10 DIAGNOSIS — D649 Anemia, unspecified: Secondary | ICD-10-CM

## 2013-02-10 DIAGNOSIS — S301XXA Contusion of abdominal wall, initial encounter: Principal | ICD-10-CM

## 2013-02-10 DIAGNOSIS — E860 Dehydration: Secondary | ICD-10-CM

## 2013-02-10 DIAGNOSIS — F0390 Unspecified dementia without behavioral disturbance: Secondary | ICD-10-CM

## 2013-02-10 DIAGNOSIS — M549 Dorsalgia, unspecified: Secondary | ICD-10-CM

## 2013-02-10 DIAGNOSIS — Z9181 History of falling: Secondary | ICD-10-CM

## 2013-02-10 DIAGNOSIS — I129 Hypertensive chronic kidney disease with stage 1 through stage 4 chronic kidney disease, or unspecified chronic kidney disease: Secondary | ICD-10-CM | POA: Diagnosis present

## 2013-02-10 DIAGNOSIS — I5032 Chronic diastolic (congestive) heart failure: Secondary | ICD-10-CM

## 2013-02-10 DIAGNOSIS — Z880 Allergy status to penicillin: Secondary | ICD-10-CM

## 2013-02-10 DIAGNOSIS — E785 Hyperlipidemia, unspecified: Secondary | ICD-10-CM

## 2013-02-10 DIAGNOSIS — G8929 Other chronic pain: Secondary | ICD-10-CM

## 2013-02-10 DIAGNOSIS — R739 Hyperglycemia, unspecified: Secondary | ICD-10-CM

## 2013-02-10 DIAGNOSIS — I509 Heart failure, unspecified: Secondary | ICD-10-CM | POA: Diagnosis present

## 2013-02-10 DIAGNOSIS — I2510 Atherosclerotic heart disease of native coronary artery without angina pectoris: Secondary | ICD-10-CM

## 2013-02-10 DIAGNOSIS — E669 Obesity, unspecified: Secondary | ICD-10-CM

## 2013-02-10 DIAGNOSIS — R059 Cough, unspecified: Secondary | ICD-10-CM

## 2013-02-10 DIAGNOSIS — D62 Acute posthemorrhagic anemia: Secondary | ICD-10-CM

## 2013-02-10 DIAGNOSIS — Z833 Family history of diabetes mellitus: Secondary | ICD-10-CM

## 2013-02-10 DIAGNOSIS — R109 Unspecified abdominal pain: Secondary | ICD-10-CM

## 2013-02-10 DIAGNOSIS — Z951 Presence of aortocoronary bypass graft: Secondary | ICD-10-CM

## 2013-02-10 DIAGNOSIS — Z79899 Other long term (current) drug therapy: Secondary | ICD-10-CM

## 2013-02-10 DIAGNOSIS — E119 Type 2 diabetes mellitus without complications: Secondary | ICD-10-CM

## 2013-02-10 DIAGNOSIS — Z7982 Long term (current) use of aspirin: Secondary | ICD-10-CM

## 2013-02-10 LAB — COMPREHENSIVE METABOLIC PANEL
ALT: 17 U/L (ref 0–53)
AST: 29 U/L (ref 0–37)
Alkaline Phosphatase: 61 U/L (ref 39–117)
CO2: 19 mEq/L (ref 19–32)
Calcium: 8.9 mg/dL (ref 8.4–10.5)
Creatinine, Ser: 1.37 mg/dL — ABNORMAL HIGH (ref 0.50–1.35)
GFR calc non Af Amer: 45 mL/min — ABNORMAL LOW (ref >90)
Glucose, Bld: 404 mg/dL — ABNORMAL HIGH (ref 70–99)
Potassium: 4.6 mEq/L (ref 3.5–5.1)
Sodium: 134 mEq/L — ABNORMAL LOW (ref 135–145)
Total Protein: 7.2 g/dL (ref 6.0–8.3)

## 2013-02-10 LAB — ABO/RH: ABO/RH(D): B POS

## 2013-02-10 LAB — CBC
Hemoglobin: 9.1 g/dL — ABNORMAL LOW (ref 13.0–17.0)
MCH: 31.8 pg (ref 26.0–34.0)
Platelets: 268 10*3/uL (ref 150–400)
RBC: 2.86 MIL/uL — ABNORMAL LOW (ref 4.22–5.81)
RDW: 13.4 % (ref 11.5–15.5)

## 2013-02-10 LAB — URINALYSIS, ROUTINE W REFLEX MICROSCOPIC
Glucose, UA: 500 mg/dL — AB
Hgb urine dipstick: NEGATIVE
Specific Gravity, Urine: 1.02 (ref 1.005–1.030)
pH: 5 (ref 5.0–8.0)

## 2013-02-10 LAB — PROTIME-INR
INR: 1.14 (ref 0.00–1.49)
Prothrombin Time: 14.4 seconds (ref 11.6–15.2)

## 2013-02-10 LAB — GLUCOSE, CAPILLARY
Glucose-Capillary: 330 mg/dL — ABNORMAL HIGH (ref 70–99)
Glucose-Capillary: 182 mg/dL — ABNORMAL HIGH (ref 70–99)

## 2013-02-10 MED ORDER — LEVOFLOXACIN 250 MG PO TABS
250.0000 mg | ORAL_TABLET | Freq: Every day | ORAL | Status: DC
  Administered 2013-02-10 – 2013-02-14 (×5): 250 mg via ORAL
  Filled 2013-02-10 (×5): qty 1

## 2013-02-10 MED ORDER — MORPHINE SULFATE 4 MG/ML IJ SOLN
4.0000 mg | Freq: Once | INTRAMUSCULAR | Status: AC
  Administered 2013-02-10: 4 mg via INTRAVENOUS
  Filled 2013-02-10: qty 1

## 2013-02-10 MED ORDER — DARIFENACIN HYDROBROMIDE ER 15 MG PO TB24
15.0000 mg | ORAL_TABLET | Freq: Every day | ORAL | Status: DC
  Administered 2013-02-10 – 2013-02-14 (×5): 15 mg via ORAL
  Filled 2013-02-10 (×5): qty 1

## 2013-02-10 MED ORDER — INSULIN ASPART 100 UNIT/ML ~~LOC~~ SOLN
0.0000 [IU] | Freq: Three times a day (TID) | SUBCUTANEOUS | Status: DC
  Administered 2013-02-11: 3 [IU] via SUBCUTANEOUS
  Administered 2013-02-12: 2 [IU] via SUBCUTANEOUS
  Administered 2013-02-13: 1 [IU] via SUBCUTANEOUS
  Administered 2013-02-13: 2 [IU] via SUBCUTANEOUS
  Administered 2013-02-13: 3 [IU] via SUBCUTANEOUS
  Administered 2013-02-14: 2 [IU] via SUBCUTANEOUS

## 2013-02-10 MED ORDER — DILTIAZEM HCL ER BEADS 180 MG PO CP24: 180.0000 mg | ORAL_CAPSULE | Freq: Every day | ORAL | Status: DC

## 2013-02-10 MED ORDER — ONDANSETRON HCL 4 MG PO TABS: 4.0000 mg | ORAL_TABLET | Freq: Four times a day (QID) | ORAL | Status: DC | PRN

## 2013-02-10 MED ORDER — ALLOPURINOL 100 MG PO TABS
100.0000 mg | ORAL_TABLET | Freq: Every day | ORAL | Status: DC
  Administered 2013-02-11 – 2013-02-14 (×4): 100 mg via ORAL
  Filled 2013-02-10 (×5): qty 1

## 2013-02-10 MED ORDER — OXYCODONE HCL 5 MG PO TABS
5.0000 mg | ORAL_TABLET | ORAL | Status: DC | PRN
  Administered 2013-02-12 – 2013-02-13 (×2): 5 mg via ORAL
  Filled 2013-02-10 (×2): qty 1

## 2013-02-10 MED ORDER — IOHEXOL 300 MG/ML  SOLN
50.0000 mL | Freq: Once | INTRAMUSCULAR | Status: AC | PRN
  Administered 2013-02-10: 50 mL via ORAL

## 2013-02-10 MED ORDER — ACETAMINOPHEN 325 MG PO TABS
650.0000 mg | ORAL_TABLET | Freq: Four times a day (QID) | ORAL | Status: DC | PRN
  Administered 2013-02-10 – 2013-02-12 (×3): 650 mg via ORAL
  Filled 2013-02-10 (×2): qty 2

## 2013-02-10 MED ORDER — SODIUM CHLORIDE 0.9 % IV SOLN
INTRAVENOUS | Status: DC
  Administered 2013-02-12 – 2013-02-13 (×3): via INTRAVENOUS

## 2013-02-10 MED ORDER — ACETAMINOPHEN 650 MG RE SUPP: 650.0000 mg | Freq: Four times a day (QID) | RECTAL | Status: DC | PRN

## 2013-02-10 MED ORDER — PANTOPRAZOLE SODIUM 40 MG PO TBEC
40.0000 mg | DELAYED_RELEASE_TABLET | Freq: Every day | ORAL | Status: DC
  Administered 2013-02-10 – 2013-02-14 (×5): 40 mg via ORAL
  Filled 2013-02-10 (×5): qty 1

## 2013-02-10 MED ORDER — SODIUM CHLORIDE 0.9 % IV BOLUS (SEPSIS)
500.0000 mL | Freq: Once | INTRAVENOUS | Status: AC
  Administered 2013-02-10: 500 mL via INTRAVENOUS

## 2013-02-10 MED ORDER — IRBESARTAN 150 MG PO TABS
150.0000 mg | ORAL_TABLET | Freq: Every day | ORAL | Status: DC
  Administered 2013-02-11 – 2013-02-14 (×4): 150 mg via ORAL
  Filled 2013-02-10 (×4): qty 1

## 2013-02-10 MED ORDER — OMEGA-3-ACID ETHYL ESTERS 1 G PO CAPS
1.0000 g | ORAL_CAPSULE | Freq: Every day | ORAL | Status: DC
  Administered 2013-02-11 – 2013-02-14 (×4): 1 g via ORAL
  Filled 2013-02-10 (×4): qty 1

## 2013-02-10 MED ORDER — ZOLPIDEM TARTRATE 5 MG PO TABS: 5.0000 mg | ORAL_TABLET | Freq: Every evening | ORAL | Status: DC | PRN

## 2013-02-10 MED ORDER — DUTASTERIDE 0.5 MG PO CAPS
0.5000 mg | ORAL_CAPSULE | Freq: Every day | ORAL | Status: DC
  Administered 2013-02-11 – 2013-02-14 (×4): 0.5 mg via ORAL
  Filled 2013-02-10 (×4): qty 1

## 2013-02-10 MED ORDER — ALUM & MAG HYDROXIDE-SIMETH 200-200-20 MG/5ML PO SUSP: 30.0000 mL | Freq: Four times a day (QID) | ORAL | Status: DC | PRN

## 2013-02-10 MED ORDER — SODIUM CHLORIDE 0.9 % IV SOLN
INTRAVENOUS | Status: DC
  Administered 2013-02-10: 17:00:00 via INTRAVENOUS

## 2013-02-10 MED ORDER — OCUVITE PO TABS
1.0000 | ORAL_TABLET | Freq: Every day | ORAL | Status: DC
  Administered 2013-02-10 – 2013-02-14 (×5): 1 via ORAL
  Filled 2013-02-10 (×5): qty 1

## 2013-02-10 MED ORDER — GLIMEPIRIDE 4 MG PO TABS
4.0000 mg | ORAL_TABLET | Freq: Every day | ORAL | Status: DC
  Administered 2013-02-11 – 2013-02-14 (×4): 4 mg via ORAL
  Filled 2013-02-10 (×5): qty 1

## 2013-02-10 MED ORDER — ATORVASTATIN CALCIUM 20 MG PO TABS
20.0000 mg | ORAL_TABLET | Freq: Every day | ORAL | Status: DC
  Administered 2013-02-10 – 2013-02-13 (×4): 20 mg via ORAL
  Filled 2013-02-10 (×5): qty 1

## 2013-02-10 MED ORDER — ONDANSETRON HCL 4 MG/2ML IJ SOLN: 4.0000 mg | Freq: Four times a day (QID) | INTRAMUSCULAR | Status: DC | PRN

## 2013-02-10 MED ORDER — SODIUM CHLORIDE 0.9 % IV BOLUS (SEPSIS)
500.0000 mL | Freq: Once | INTRAVENOUS | Status: AC
  Administered 2013-02-10: 18:00:00 via INTRAVENOUS

## 2013-02-10 MED ORDER — SODIUM CHLORIDE 0.9 % IV SOLN
INTRAVENOUS | Status: AC
  Administered 2013-02-10: 21:00:00 via INTRAVENOUS

## 2013-02-10 MED ORDER — DILTIAZEM HCL ER COATED BEADS 180 MG PO CP24
180.0000 mg | ORAL_CAPSULE | Freq: Every day | ORAL | Status: DC
  Administered 2013-02-11 – 2013-02-14 (×4): 180 mg via ORAL
  Filled 2013-02-10 (×4): qty 1

## 2013-02-10 MED ORDER — INSULIN ASPART 100 UNIT/ML ~~LOC~~ SOLN
0.0000 [IU] | Freq: Every day | SUBCUTANEOUS | Status: DC
  Administered 2013-02-12: 3 [IU] via SUBCUTANEOUS
  Administered 2013-02-13: 2 [IU] via SUBCUTANEOUS

## 2013-02-10 MED ORDER — HYDROMORPHONE HCL PF 1 MG/ML IJ SOLN: 0.5000 mg | INTRAMUSCULAR | Status: DC | PRN

## 2013-02-10 MED ORDER — ONDANSETRON HCL 4 MG/2ML IJ SOLN
4.0000 mg | Freq: Once | INTRAMUSCULAR | Status: AC
  Administered 2013-02-10: 4 mg via INTRAVENOUS
  Filled 2013-02-10: qty 2

## 2013-02-10 MED ORDER — PREDNISONE 20 MG PO TABS
40.0000 mg | ORAL_TABLET | Freq: Every day | ORAL | Status: DC
  Administered 2013-02-11: 40 mg via ORAL
  Filled 2013-02-10 (×2): qty 2

## 2013-02-10 NOTE — ED Notes (Signed)
Trying to get patient to urinate

## 2013-02-10 NOTE — ED Notes (Signed)
Bed: FA21 Expected date:  Expected time:  Means of arrival:  Comments: EMS-bronchitis

## 2013-02-10 NOTE — H&P (Signed)
[facility] History and Physical  PIERSON VANTOL NWG:[id number] DOB: 1923-10-14 DOA: 02/10/2013  Referring physician:  EDP PCP: Laurena Slimmer, MD  Specialists:   Chief Complaint: Increased ABD Pain After Fall  HPI: Albert Nelson is a 79 y.o. male with Dementia and Multiple medical problems including DM2 who was brought to the ED after he suffered a fall and had increased LLQ ABD pain afterward.  His wife called EMS and he was brought to the ED and he was evaluated and had a CT scan which revealed a large 12 x 7 cm Hematoma of the Rectus Sheath.  He is on Plavix and Aspirin Rx for His CAD S/P CABG and PTCA with Stents X 2.  He has had increased falls for the past 2 years per his wife, due to weakness.  Also he has been having increased cough and weakness for the past week and started on Levaquin and a prednisone taper by his PCP for Acute bronchitis today   Review of Systems: The patient denies anorexia, fever, chills, headaches, weight loss, vision loss, diplopia, dizziness, decreased hearing, rhinitis, hoarseness, chest pain, syncope, dyspnea on exertion, peripheral edema, balance deficits, cough, hemoptysis, abdominal pain, nausea, vomiting, diarrhea, constipation, hematemesis, melena, hematochezia, severe indigestion/heartburn, dysuria, hematuria, incontinence, muscle weakness, suspicious skin lesions, transient blindness, difficulty walking, depression, unusual weight change, abnormal bleeding, enlarged lymph nodes, angioedema, and breast masses.    Past Medical History  Diagnosis Date  . Diabetes mellitus   . Dementia   . Chronic back pain   . GERD (gastroesophageal reflux disease)   . High cholesterol   . Hypertension   . Shortness of breath   . Stroke   . Anemia   . History of myocardial infarction 1987  . CAD in native artery 1987    Referred for CABG x3  . S/P CABG x 3 1987    LIMA-LAD, SVG-Diag, SVG-RCA  . CAD (coronary artery disease), autologous vein bypass graft  2004    Both SVG-diagonal and-RCA occluded --> PCI to the native circumflex and PL  . CAD S/P percutaneous coronary angioplasty 2004    PCI-RPL: 3.0-mm-x-23-mm Cypher DES; PCI-Cx-OM: 2.5-x-26-mm 2.5-x-13-mm and another 2.5-x-13-mm(overlapping) -> proximal post-dilation to 2.85  . S/P cardiac catheterization 2006    ISR in circumflex stent --> Cutting Balloon PTCA (2.75 balloon);; proximal LAD 75% with 100% mid occlusion after SP1, RCA 50% proximal, patent stent in probable PLA.  Patent LIMA.  3 sequential 99% lesions in distal LAD, small vessel  . Syncope and collapse  summer 2014     Associated with the exhaustion/heat stroke after being locked outside.    Past Surgical History  Procedure Laterality Date  . Coronary stent placement  January 2004    Both SVG is occluded.  PCI-RPL 3.0-mm-x-23-mm Cypher DES; PCI-Cx-OM : Cypher 2.5-x-26-mm   . Coronary artery bypass graft  1987    LIMA-LAD, SVG-diagonal, SVG-RCA  . Coronary angioplasty  November 2006    Cutting balloon PTCA with a proximal in-stent restenosis in the  . Nm myoview ltd  June 2010    Persantine Myoview showing medium-sized moderate-intensity defect in the apical inferior and mid inferior wall with mild to moderate ischemia in the distribution of the RCA and distal circumflex. Treated medically   . Transthoracic echocardiogram  June 2010    EF 50% to 55% with mildly dilated left atrium and moderate aortic sclerosis - no stenosis    Prior to Admission medications   Medication Sig Start Date  End Date Taking? Authorizing Provider  acetaminophen (TYLENOL) 500 MG tablet Take 2 tablets (1,000 mg total) by mouth 2 (two) times daily. 09/22/12  Yes Shanker Levora Dredge, MD  allopurinol (ZYLOPRIM) 100 MG tablet Take 100 mg by mouth daily.   Yes Historical Provider, MD  aspirin EC 81 MG tablet Take 81 mg by mouth daily.   Yes Historical Provider, MD  beta carotene w/minerals (OCUVITE) tablet Take 1 tablet by mouth daily.   Yes Historical  Provider, MD  clopidogrel (PLAVIX) 75 MG tablet Take 75 mg by mouth daily.   Yes Historical Provider, MD  diltiazem (TIAZAC) 180 MG 24 hr capsule Take 180 mg by mouth daily.   Yes Historical Provider, MD  dutasteride (AVODART) 0.5 MG capsule Take 0.5 mg by mouth daily.   Yes Historical Provider, MD  glimepiride (AMARYL) 4 MG tablet Take 4 mg by mouth daily before breakfast.   Yes Historical Provider, MD  guaiFENesin (MUCINEX) 600 MG 12 hr tablet Take 1 tablet (600 mg total) by mouth 2 (two) times daily as needed for cough. 02/09/13  Yes Costin Otelia Sergeant, MD  lansoprazole (PREVACID) 30 MG capsule Take 30 mg by mouth daily.   Yes Historical Provider, MD  levofloxacin (LEVAQUIN) 250 MG tablet Take 250 mg by mouth daily. Started 12/5 for 5 days   Yes Historical Provider, MD  levofloxacin (LEVAQUIN) 250 MG tablet Take 250 mg by mouth daily. 02/09/13  Yes Costin Otelia Sergeant, MD  Methylcellulose, Laxative, (CITRUCEL PO) Take 2 tablets by mouth daily.   Yes Historical Provider, MD  nabumetone (RELAFEN) 500 MG tablet Take 500 mg by mouth 2 (two) times daily.   Yes Historical Provider, MD  Omega-3 Fatty Acids (FISH OIL) 1200 MG CAPS Take 1 capsule by mouth daily.   Yes Historical Provider, MD  predniSONE (DELTASONE) 20 MG tablet Take 2 tablets (40 mg total) by mouth daily with breakfast. 2 tabs x 1 day then 1 tab x 2 days hen 0.5 tabs x 2 days then stop. 02/09/13  Yes Costin Otelia Sergeant, MD  rosuvastatin (CRESTOR) 10 MG tablet Take 10 mg by mouth daily.   Yes Historical Provider, MD  solifenacin (VESICARE) 10 MG tablet Take 10 mg by mouth daily.   Yes Historical Provider, MD  valsartan (DIOVAN) 160 MG tablet Take 80 mg by mouth daily. 02/09/13  Yes Costin Otelia Sergeant, MD    Allergies  Allergen Reactions  . Penicillins Anaphylaxis  . Novolog [Insulin Aspart] Itching and Rash    Social History:  Married  reports that he quit smoking about 29 years ago. He has never used smokeless tobacco. He reports that he  drinks alcohol. He reports that he does not use illicit drugs.     Family History:    Mother had diabetes   Physical Exam:  GEN:  Pleasant Obese Elderly 79 y.o. African American  male  examined  and in no acute distress; cooperative with exam Filed Vitals:   02/10/13 1557 02/10/13 1600 02/10/13 1700 02/10/13 1730  BP: 138/72 132/61 119/61 122/50  Pulse: 98 89 80 80  Temp: 98.4 F (36.9 C)     TempSrc: Oral     Resp: 18  22 19   SpO2: 99% 97% 100% 100%   Blood pressure 122/50, pulse 80, temperature 98.4 F (36.9 C), temperature source Oral, resp. rate 19, SpO2 100.00%. PSYCH: He is alert and oriented x 1; does not appear anxious does not appear depressed; affect is normal HEENT: Normocephalic and Atraumatic,  Mucous membranes pink; PERRLA; EOM intact; Fundi:  Benign;  No scleral icterus, Nares: Patent, Oropharynx: Clear,Poor  Dentition, Neck:  FROM, no cervical lymphadenopathy nor thyromegaly or carotid bruit; no JVD; Breasts:: Not examined CHEST WALL: No tenderness CHEST: Normal respiration, clear to auscultation bilaterally HEART: Regular rate and rhythm; no murmurs rubs or gallops BACK: No kyphosis or scoliosis; no CVA tenderness ABDOMEN: Positive Bowel Sounds,Obese, soft , tender in the LLQ; no masses, no organomegaly, no pannus; no intertriginous candida. Rectal Exam: Not done EXTREMITIES: No cyanosis, clubbing or edema; no ulcerations. Genitalia: not examined PULSES: 2+ and symmetric SKIN: Normal hydration no rash or ulceration CNS: Cranial nerves 2-12 grossly intact no focal neurologic deficit    Labs on Admission:  Basic Metabolic Panel:  Recent Labs Lab 02/07/13 1623 02/08/13 0357 02/09/13 0504 02/10/13 1630  NA 136 134* 138 134*  K 4.3 5.2* 4.3 4.6  CL 101 102 106 101  CO2 23 20 21 19   GLUCOSE 110* 171* 229* 404*  BUN 31* 35* 46* 55*  CREATININE 1.51* 1.50* 1.54* 1.37*  CALCIUM 9.4 8.5 8.6 8.9   Liver Function Tests:  Recent Labs Lab 02/07/13 1623  02/10/13 1630  AST 18 29  ALT 13 17  ALKPHOS 81 61  BILITOT 0.4 0.2*  PROT 8.0 7.2  ALBUMIN 3.7 3.2*    Recent Labs Lab 02/10/13 1630  LIPASE 9*   No results found for this basename: AMMONIA,  in the last 168 hours CBC:  Recent Labs Lab 02/07/13 1623 02/08/13 0357 02/10/13 1630  WBC 15.1* 13.3* 19.2*  NEUTROABS 9.2*  --   --   HGB 11.4* 9.5* 9.1*  HCT 34.0* 28.8* 27.1*  MCV 95.5 94.1 94.8  PLT 248 198 268   Cardiac Enzymes:  Recent Labs Lab 02/07/13 1623 02/07/13 2150  TROPONINI <0.30 <0.30    BNP (last 3 results) No results found for this basename: PROBNP,  in the last 8760 hours CBG:  Recent Labs Lab 02/08/13 2309 02/09/13 0745 02/09/13 1133 02/10/13 1644 02/10/13 1918  GLUCAP 218* 161* 194* 330* 252*    Radiological Exams on Admission: Ct Abdomen Pelvis Wo Contrast  02/10/2013   CLINICAL DATA:  Left lower quadrant pain  EXAM: CT ABDOMEN AND PELVIS WITHOUT CONTRAST  TECHNIQUE: Multidetector CT imaging of the abdomen and pelvis was performed following the standard protocol without intravenous contrast.  COMPARISON:  02/27/2008  FINDINGS: Sagittal images of the spine is shows degenerative changes lumbar spine. Again noted is synovial cyst at L4-L5 level.  Study is limited without IV contrast. Small calcified gallstones are noted within gallbladder. The largest measures about 6 mm. Tiny hiatal hernia.  Fatty replaced pancreas is noted. Unenhanced liver shows no biliary ductal dilatation. Unenhanced kidneys shows no nephrolithiasis. No hydronephrosis or hydroureter. No calcified ureteral calculi. Atherosclerotic calcifications of abdominal aorta and iliac arteries. No aortic aneurysm.  Moderate stool noted in right colon. No pericecal inflammation. Sigmoid colon diverticula are noted. Scattered diverticula are noted descending colon. No evidence of acute diverticulitis. There is a large probable acute hematoma in left rectus muscle measures 12 by 7.4 cm. Small  hemorrhagic products are noted just anterior to the urinary bladder. There is thickening of anterior wall of the urinary bladder. Inflammatory changes cannot be excluded. Clinical correlation is necessary.  Prostate gland is unremarkable. Bilateral mild retroperitoneal stranding without confluent fluid collection. Small amount of fluid is noted in left anterior pelvis.  Oral contrast material was given to the patient. There is no small  bowel obstruction. No free abdominal air. No adenopathy.  IMPRESSION: 1. There is large left rectus muscle acute hematoma measures at least 12 x 7.4 cm. Small amount of hemorrhagic products are noted just anterior to urinary bladder. Mild thickening of anterior wall of the urinary bladder probable due to inflammatory changes. 2. No nephrolithiasis.  No hydronephrosis or hydroureter. 3. Distal colonic diverticula.  No evidence of acute diverticulitis. 4. Small calcified gallstones are noted within gallbladder the largest measures 6 mm. Critical Value/emergent results were called by telephone at the time of interpretation on 02/10/2013 at 6:05 PM to Dr.KEVIN [facility] , who verbally acknowledged these results.   Electronically Signed   By: Natasha Mead M.D.   On: 02/10/2013 18:07   Dg Chest 2 View  02/10/2013   CLINICAL DATA:  Cough.  Abdominal pain.  Recent bronchitis.  EXAM: CHEST  2 VIEW  COMPARISON:  02/07/2013.  FINDINGS: The heart remains normal in size. Stable post CABG changes. Clear lungs. Unremarkable bones.  IMPRESSION: No acute abnormality.   Electronically Signed   By: Gordan Payment M.D.   On: 02/10/2013 19:18      Assessment/Plan Principal Problem:   Rectus sheath hematoma Active Problems:   Falls frequently   Hyperglycemia   Bronchitis   DM (diabetes mellitus)   Hyperlipidemia   Dementia   GERD (gastroesophageal reflux disease)   Chronic diastolic CHF (congestive heart failure), NYHA class 2   CKD (chronic kidney disease) stage 3, GFR 30-59 ml/min     Anemia     1.  Rectus Sheath Hematoma-   Monitor for changes, and for further decrease in his hemoglobin.   Pain Control PRN.    2.   Falls - consult PT for evaluation, may need Reconditioning or Gait Training.    3.   Hyperglycemia- Exacerbated by Steroids,  SSi coverage PRN and check HbA1c.    4.   Acute Bronchitis-  Continue on PO Levaquin and Steroid taper with Prednisone.    5.   DM2- on Glimepiride.    6.   Chronic diastolic CHF- monitor for S/Sxs of Volume Overload.    7.   CKD- MOniotr BUN.Cr.    8.   GERD-  Protonix PO while Inpatient.      9.  Hyperlipidemia- continue   Statin Rx.   10.  HTN- continue Diltiazem and Diovan Rx.    11. DVT prophylaxis with SCDs  12.  Anemia- Monitor Hb for loss due to #1,  Hold Plavix, Aspirin, and NSAIDs.        Code Status:      FULL CODE Family Communication:    Wife is at the Bedside Disposition Plan:         Time spent:  3 Minutes  Ron Parker [facility] Pager [phone]  If 7PM-7AM, please contact night-coverage [email or web address] Password [facility] 02/10/2013, 8:18 PM

## 2013-02-10 NOTE — ED Notes (Signed)
Blood sugar 252
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
## 2013-02-10 NOTE — ED Notes (Signed)
Patient was discharged from [facility] for bronchitis yesterday. Patient is unable to walk or stand. Has nonproductive cough. Currently taking  prednisone and antibiotics. Has mild dementia. Wife unable to care for him. [id number]

## 2013-02-11 ENCOUNTER — Encounter (HOSPITAL_COMMUNITY): Payer: Self-pay | Admitting: *Deleted

## 2013-02-11 DIAGNOSIS — D62 Acute posthemorrhagic anemia: Secondary | ICD-10-CM

## 2013-02-11 LAB — BASIC METABOLIC PANEL
CO2: 24 mEq/L (ref 19–32)
Calcium: 8.4 mg/dL (ref 8.4–10.5)
Chloride: 109 mEq/L (ref 96–112)
Creatinine, Ser: 1.26 mg/dL (ref 0.50–1.35)
GFR calc Af Amer: 57 mL/min — ABNORMAL LOW (ref >90)
Glucose, Bld: 160 mg/dL — ABNORMAL HIGH (ref 70–99)
Sodium: 141 mEq/L (ref 135–145)

## 2013-02-11 LAB — HEMOGLOBIN AND HEMATOCRIT, BLOOD
HCT: 19.7 % — ABNORMAL LOW (ref 39.0–52.0)
Hemoglobin: 6.7 g/dL — CL (ref 13.0–17.0)

## 2013-02-11 LAB — CBC
MCH: 32.5 pg (ref 26.0–34.0)
MCV: 94.5 fL (ref 78.0–100.0)
Platelets: 213 10*3/uL (ref 150–400)
RBC: 2.37 MIL/uL — ABNORMAL LOW (ref 4.22–5.81)
RDW: 13.6 % (ref 11.5–15.5)

## 2013-02-11 LAB — GLUCOSE, CAPILLARY
Glucose-Capillary: 108 mg/dL — ABNORMAL HIGH (ref 70–99)
Glucose-Capillary: 236 mg/dL — ABNORMAL HIGH (ref 70–99)
Glucose-Capillary: 182 mg/dL — ABNORMAL HIGH (ref 70–99)

## 2013-02-11 LAB — PREPARE RBC (CROSSMATCH)

## 2013-02-11 MED ORDER — PREDNISONE 20 MG PO TABS
30.0000 mg | ORAL_TABLET | Freq: Every day | ORAL | Status: DC
  Administered 2013-02-12 – 2013-02-13 (×2): 30 mg via ORAL
  Filled 2013-02-11 (×3): qty 1

## 2013-02-11 NOTE — Evaluation (Signed)
Physical Therapy Evaluation Patient Details Name: Albert Nelson MRN: [id number] DOB: 1923/08/20 Today's Date: 02/11/2013 Time: 3086-5784 PT Time Calculation (min): 18 min  PT Assessment / Plan / Recommendation History of Present Illness  Albert Nelson is a 79 y.o. male with Dementia and Multiple medical problems including DM2 who was brought to the ED after he suffered a fall and had increased LLQ ABD pain afterward.  His wife called EMS and he was brought to the ED and he was evaluated and had a CT scan which revealed a large 12 x 7 cm Hematoma of the Rectus Sheath.  Pt had just been d/c from hospital the day before after treatment for bronchitis  Clinical Impression  Pt is limited in mobility, gait and safety and has recurrent fall.  Recommend short duration of daily PT and OT prior to returning home to regain optimal strength and endurance.    PT Assessment  Patient needs continued PT services    Follow Up Recommendations  SNF    Does the patient have the potential to tolerate intense rehabilitation      Barriers to Discharge        Equipment Recommendations  None recommended by PT    Recommendations for Other Services OT consult   Frequency Min 3X/week    Precautions / Restrictions Precautions Precautions: Fall Precaution Comments: pt with hematoma of rectus abdominus muscle  Restrictions Weight Bearing Restrictions: No   Pertinent Vitals/Pain Pt c/o cramping in legs and also in abdomen      Mobility  Bed Mobility Bed Mobility: Supine to Sit;Sit to Supine Supine to Sit: 4: Min assist Sit to Supine: HOB elevated;3: Mod assist Details for Bed Mobility Assistance: maintained log rolling to protect abdomen Transfers Transfers: Sit to Stand;Stand to Sit Sit to Stand: 4: Min assist;From bed Stand to Sit: 4: Min assist;To bed Details for Transfer Assistance: Assist to rise, stabilie, control descent. Multimodal cues for safety, technique, hand placement. Pt tends to  want to pull up on walker.  Ambulation/Gait Ambulation/Gait Assistance: 4: Min guard;4: Min Environmental consultant (Feet): 10 Feet (pt c/o cramping in his legs) Assistive device: Rolling walker Ambulation/Gait Assistance Details: assist needed to guide RW and prgress it forward.. Pt limited by "cramping" in his legs Gait Pattern: Decreased step length - right;Decreased step length - left;Shuffle;Decreased stride length Gait velocity: decreased General Gait Details: Pt is unable to to correct technique for safety and is limited in distance. He needs hands on assist Stairs: No Wheelchair Mobility Wheelchair Mobility: No    Exercises     PT Diagnosis: Generalized weakness;Difficulty walking  PT Problem List: Decreased strength;Decreased activity tolerance;Decreased mobility;Decreased knowledge of use of DME;Decreased cognition PT Treatment Interventions: DME instruction;Gait training;Functional mobility training;Therapeutic activities;Therapeutic exercise;Patient/family education     PT Goals(Current goals can be found in the care plan section) Acute Rehab PT Goals Patient Stated Goal: none stated by patient PT Goal Formulation: Patient unable to participate in goal setting Time For Goal Achievement: 02/25/13 Potential to Achieve Goals: Good  Visit Information  Last PT Received On: 02/11/13 Assistance Needed: +1 History of Present Illness: Albert Nelson is a 79 y.o. male with Dementia and Multiple medical problems including DM2 who was brought to the ED after he suffered a fall and had increased LLQ ABD pain afterward.  His wife called EMS and he was brought to the ED and he was evaluated and had a CT scan which revealed a large 12 x 7 cm Hematoma  of the Rectus Sheath.  Pt had just been d/c from hospital the day before after treatment for bronchitis       Prior Functioning  Home Living Family/patient expects to be discharged to:: Private residence Living Arrangements:  Spouse/significant other Type of Home: House Home Access: Stairs to enter [employer] of Steps: 1 Home Equipment: Environmental consultant - 2 wheels;Shower seat;Bedside commode;Hand held shower head;Cane - single point Additional Comments: walk in shower, one step up in the house. siderail for bed Prior Function Level of Independence: Independent with assistive device(s);Needs assistance Gait / Transfers Assistance Needed: ambulates household distances with walker. Pt also goes to adult daycare 9-4 ADL's / Homemaking Assistance Needed: wife provides supervision for ADLs and assists with lower body Communication Communication: No difficulties Dominant Hand: Right    Cognition  Cognition Arousal/Alertness: Awake/alert Behavior During Therapy: WFL for tasks assessed/performed Overall Cognitive Status: History of cognitive impairments - at baseline    Extremity/Trunk Assessment Upper Extremity Assessment Upper Extremity Assessment: Generalized weakness Lower Extremity Assessment Lower Extremity Assessment: Generalized weakness Cervical / Trunk Assessment Cervical / Trunk Exceptions: did not test abdomen due to recent hematoma   Balance Balance Balance Assessed: Yes Static Sitting Balance Static Sitting - Balance Support: Left upper extremity supported;Feet supported Static Sitting - Level of Assistance: 5: Stand by assistance Static Standing Balance Static Standing - Balance Support: Bilateral upper extremity supported;During functional activity Static Standing - Level of Assistance: 4: Min assist  End of Session PT - End of Session Activity Tolerance: Patient limited by pain Patient left: with bed alarm set;in bed  GP    Rosey Bath K. Manson Passey, Helen [phone] 02/11/2013, 11:33 AM

## 2013-02-11 NOTE — Progress Notes (Signed)
Utilization Review completed.  

## 2013-02-11 NOTE — Progress Notes (Signed)
[facility] PROGRESS NOTE  KRAVEN CALK ZOX:[id number] DOB: Feb 08, 1924 DOA: 02/10/2013 PCP: Laurena Slimmer, MD  Assessment/Plan: Large Rectus Sheath Hematoma -Following a fall after he had been discharged 12/5. -Follow Hb and transfuse for Hb <7.  ABLA -Hb has decreased close to 2 gr. -Follow for need for transfusion.  Deconditioning -PT has recommended SNF. -Please note that on prior admission PT was recommended, however patient's wife refused to have anything set up.  CAD -Stable. No chest pain. -ASA/Plavix are on hold given primary issue. -His stents are remote.  Acute Bronchitis -Recent Dx by his PCP. -Continue steroid taper and levaquin.  Chronic Diastolic CHF -Compensated.  Code Status: Full Code Family Communication: Patient only  Disposition Plan: SNF   Consultants:  None   Antibiotics:  Levaquib   Subjective: Abdominal pain is improved.  Objective: Filed Vitals:   02/10/13 1730 02/10/13 2034 02/11/13 0546 02/11/13 1057  BP: 122/50 126/56 115/45 114/47  Pulse: 80 76 66 70  Temp:  98.5 F (36.9 C) 97.8 F (36.6 C)   TempSrc:  Oral Oral   Resp: 19 16 20 20   Height:  5\' 6"  (1.676 m)    Weight:  91.2 kg (201 lb 1 oz)    SpO2: 100% 100% 99% 100%    Intake/Output Summary (Last 24 hours) at 02/11/13 1235 Last data filed at 02/11/13 0700  Gross per 24 hour  Intake 1273.33 ml  Output    200 ml  Net 1073.33 ml   Filed Weights   02/10/13 2034  Weight: 91.2 kg (201 lb 1 oz)    Exam:   General:  AA Ox3  Cardiovascular: RRR  Respiratory: CTA B  Abdomen: S/ND/+BS  Extremities: 1+ edema   Neurologic:  Non-focal  Data Reviewed: Basic Metabolic Panel:  Recent Labs Lab 02/07/13 1623 02/08/13 0357 02/09/13 0504 02/10/13 1630 02/11/13 0450  NA 136 134* 138 134* 141  K 4.3 5.2* 4.3 4.6 4.4  CL 101 102 106 101 109  CO2 23 20 21 19 24   GLUCOSE 110* 171* 229* 404* 160*  BUN 31* 35* 46* 55* 51*  CREATININE 1.51* 1.50*  1.54* 1.37* 1.26  CALCIUM 9.4 8.5 8.6 8.9 8.4   Liver Function Tests:  Recent Labs Lab 02/07/13 1623 02/10/13 1630  AST 18 29  ALT 13 17  ALKPHOS 81 61  BILITOT 0.4 0.2*  PROT 8.0 7.2  ALBUMIN 3.7 3.2*    Recent Labs Lab 02/10/13 1630  LIPASE 9*   No results found for this basename: AMMONIA,  in the last 168 hours CBC:  Recent Labs Lab 02/07/13 1623 02/08/13 0357 02/10/13 1630 02/11/13 0450  WBC 15.1* 13.3* 19.2* 15.2*  NEUTROABS 9.2*  --   --   --   HGB 11.4* 9.5* 9.1* 7.7*  HCT 34.0* 28.8* 27.1* 22.4*  MCV 95.5 94.1 94.8 94.5  PLT 248 198 268 213   Cardiac Enzymes:  Recent Labs Lab 02/07/13 1623 02/07/13 2150  TROPONINI <0.30 <0.30   BNP (last 3 results) No results found for this basename: PROBNP,  in the last 8760 hours CBG:  Recent Labs Lab 02/10/13 1644 02/10/13 1918 02/10/13 2321 02/11/13 0723 02/11/13 1147  GLUCAP 330* 252* 182* 125* 108*    No results found for this or any previous visit (from the past 240 hour(s)).   Studies: Ct Abdomen Pelvis Wo Contrast  02/10/2013   CLINICAL DATA:  Left lower quadrant pain  EXAM: CT ABDOMEN AND PELVIS WITHOUT CONTRAST  TECHNIQUE: Multidetector CT  imaging of the abdomen and pelvis was performed following the standard protocol without intravenous contrast.  COMPARISON:  02/27/2008  FINDINGS: Sagittal images of the spine is shows degenerative changes lumbar spine. Again noted is synovial cyst at L4-L5 level.  Study is limited without IV contrast. Small calcified gallstones are noted within gallbladder. The largest measures about 6 mm. Tiny hiatal hernia.  Fatty replaced pancreas is noted. Unenhanced liver shows no biliary ductal dilatation. Unenhanced kidneys shows no nephrolithiasis. No hydronephrosis or hydroureter. No calcified ureteral calculi. Atherosclerotic calcifications of abdominal aorta and iliac arteries. No aortic aneurysm.  Moderate stool noted in right colon. No pericecal inflammation. Sigmoid  colon diverticula are noted. Scattered diverticula are noted descending colon. No evidence of acute diverticulitis. There is a large probable acute hematoma in left rectus muscle measures 12 by 7.4 cm. Small hemorrhagic products are noted just anterior to the urinary bladder. There is thickening of anterior wall of the urinary bladder. Inflammatory changes cannot be excluded. Clinical correlation is necessary.  Prostate gland is unremarkable. Bilateral mild retroperitoneal stranding without confluent fluid collection. Small amount of fluid is noted in left anterior pelvis.  Oral contrast material was given to the patient. There is no small bowel obstruction. No free abdominal air. No adenopathy.  IMPRESSION: 1. There is large left rectus muscle acute hematoma measures at least 12 x 7.4 cm. Small amount of hemorrhagic products are noted just anterior to urinary bladder. Mild thickening of anterior wall of the urinary bladder probable due to inflammatory changes. 2. No nephrolithiasis.  No hydronephrosis or hydroureter. 3. Distal colonic diverticula.  No evidence of acute diverticulitis. 4. Small calcified gallstones are noted within gallbladder the largest measures 6 mm. Critical Value/emergent results were called by telephone at the time of interpretation on 02/10/2013 at 6:05 PM to Dr.KEVIN [facility] , who verbally acknowledged these results.   Electronically Signed   By: Natasha Mead M.D.   On: 02/10/2013 18:07   Dg Chest 2 View  02/10/2013   CLINICAL DATA:  Cough.  Abdominal pain.  Recent bronchitis.  EXAM: CHEST  2 VIEW  COMPARISON:  02/07/2013.  FINDINGS: The heart remains normal in size. Stable post CABG changes. Clear lungs. Unremarkable bones.  IMPRESSION: No acute abnormality.   Electronically Signed   By: Gordan Payment M.D.   On: 02/10/2013 19:18    Scheduled Meds: . allopurinol  100 mg Oral Daily  . atorvastatin  20 mg Oral q1800  . beta carotene w/minerals  1 tablet Oral Daily  . darifenacin  15 mg  Oral Daily  . diltiazem  180 mg Oral Daily  . dutasteride  0.5 mg Oral Daily  . glimepiride  4 mg Oral QAC breakfast  . insulin aspart  0-5 Units Subcutaneous QHS  . insulin aspart  0-9 Units Subcutaneous TID WC  . irbesartan  150 mg Oral Daily  . levofloxacin  250 mg Oral Daily  . omega-3 acid ethyl esters  1 g Oral Daily  . pantoprazole  40 mg Oral Daily  . predniSONE  40 mg Oral Q breakfast   Continuous Infusions: . sodium chloride 20 mL/hr at 02/10/13 1703  . sodium chloride      Principal Problem:   Rectus sheath hematoma Active Problems:   Acute blood loss anemia   DM (diabetes mellitus)   Hyperlipidemia   Dementia   GERD (gastroesophageal reflux disease)   Chronic diastolic CHF (congestive heart failure), NYHA class 2   Bronchitis   CKD (chronic kidney  disease) stage 3, GFR 30-59 ml/min   Hyperglycemia   Falls frequently    Time spent: 35 minutes.  Greater than 50% of this time was spent in direct contact with the patient coordinating care.    Chaya Jan  [facility] Pager [phone]  If 7PM-7AM, please contact night-coverage at [email or web address], password [facility] 02/11/2013, 12:35 PM  LOS: 1 day

## 2013-02-11 NOTE — Progress Notes (Signed)
CRITICAL VALUE ALERT  Critical value received:  hgb 6.7  Date of notification:  02/11/2013  Time of notification:  2245  Critical value read back:yes  Nurse who received alert:  Bretta Bang  MD notified (1st page):  R Reidler (on call provider)  Time of first page:  2258  MD notified (2nd page):  Time of second page:  Responding MD:  R Reidler  Time MD responded:  2345  See new orders

## 2013-02-12 LAB — GLUCOSE, CAPILLARY
Glucose-Capillary: 151 mg/dL — ABNORMAL HIGH (ref 70–99)
Glucose-Capillary: 251 mg/dL — ABNORMAL HIGH (ref 70–99)

## 2013-02-12 LAB — CBC
MCH: 28.7 pg (ref 26.0–34.0)
MCHC: 33.3 g/dL (ref 30.0–36.0)
Platelets: 193 10*3/uL (ref 150–400)
RBC: 2.75 MIL/uL — ABNORMAL LOW (ref 4.22–5.81)
RDW: 22.6 % — ABNORMAL HIGH (ref 11.5–15.5)
WBC: 12.5 10*3/uL — ABNORMAL HIGH (ref 4.0–10.5)

## 2013-02-12 LAB — BASIC METABOLIC PANEL
Calcium: 8.3 mg/dL — ABNORMAL LOW (ref 8.4–10.5)
Creatinine, Ser: 1.12 mg/dL (ref 0.50–1.35)
GFR calc Af Amer: 66 mL/min — ABNORMAL LOW (ref >90)
GFR calc non Af Amer: 57 mL/min — ABNORMAL LOW (ref >90)
Glucose, Bld: 116 mg/dL — ABNORMAL HIGH (ref 70–99)
Sodium: 140 mEq/L (ref 135–145)

## 2013-02-12 MED ORDER — BOOST PLUS PO LIQD
237.0000 mL | Freq: Every day | ORAL | Status: DC
  Administered 2013-02-12 – 2013-02-13 (×2): 237 mL via ORAL
  Filled 2013-02-12 (×3): qty 237

## 2013-02-12 NOTE — Progress Notes (Addendum)
Clinical Social Work Department CLINICAL SOCIAL WORK PLACEMENT NOTE 02/12/2013  Patient:  Albert Nelson, Albert Nelson  Account Number:  [id number] Admit date:  02/10/2013  Clinical Social Worker:  Orpah Greek  Date/time:  02/12/2013 12:33 PM  Clinical Social Work is seeking post-discharge placement for this patient at the following level of care:   SKILLED NURSING   (*CSW will update this form in Epic as items are completed)   02/12/2013  Patient/family provided with [facility] Department of Clinical Social Work's list of facilities offering this level of care within the geographic area requested by the patient (or if unable, by the patient's family).  02/12/2013  Patient/family informed of their freedom to choose among providers that offer the needed level of care, that participate in Medicare, Medicaid or managed care program needed by the patient, have an available bed and are willing to accept the patient.  02/12/2013  Patient/family informed of [facility]' ownership interest in [facility], as well as of the fact that they are under no obligation to receive care at this facility.  PASARR submitted to EDS on 02/12/2013 PASARR number received from EDS on 02/12/2013  FL2 transmitted to all facilities in geographic area requested by pt/family on  02/12/2013 FL2 transmitted to all facilities within larger geographic area on   Patient informed that his/her managed care company has contracts with or will negotiate with  certain facilities, including the following:     Patient/family informed of bed offers received:  02/13/2013 Patient chooses bed at [facility] and [facility] Physician recommends and patient chooses bed at    Patient to be transferred to  on  [facility] on 02/14/2013 Patient to be transferred to facility by ambulance Sharin Mons)  The following physician request were entered in Epic:   Additional Comments:   [name], LCSW [facility] Clinical Social Worker cell #: [phone]  Jacklynn Lewis, MSW, LCSWA  Clinical Social Work [phone]

## 2013-02-12 NOTE — Progress Notes (Signed)
Clinical Social Work Department BRIEF PSYCHOSOCIAL ASSESSMENT 02/12/2013  Patient:  Albert Nelson, Albert Nelson     Account Number:  [id number]     Admit date:  02/10/2013  Clinical Social Worker:  Orpah Greek  Date/Time:  02/12/2013 12:24 PM  Referred by:  Physician  Date Referred:  02/12/2013 Referred for  SNF Placement   Other Referral:   Interview type:  Patient Other interview type:   and wife at bedside    PSYCHOSOCIAL DATA Living Status:  WIFE Admitted from facility:   Level of care:   Primary support name:  Albert Nelson (wife) h#: [phone] c#: [phone] Primary support relationship to patient:  SPOUSE Degree of support available:   good    CURRENT CONCERNS Current Concerns  Post-Acute Placement   Other Concerns:    SOCIAL WORK ASSESSMENT / PLAN CSW received consult for SNF placement, PT recommended SNF for patient at discharge.   Assessment/plan status:  Information/Referral to [facility] Other assessment/ plan:   Information/referral to community resources:   CSW completed FL2 and faxed information out to [facility] SNFs - made [facility] aware that if PT still recommends SNF at discharge that wife would prefer that he go there.    PATIENT'S/FAMILY'S RESPONSE TO PLAN OF CARE: Patient's wife informed CSW that she stays [facility] from 9a - 4:00p and is with him the rest of the day/evening.    Patient had been to [facility] from 7/18 - 11/10/12 and wife would prefer that he return there if SNF is still recommended at discharge.       Albert Bailey, LCSW [facility] Clinical Social Worker cell #: [phone]

## 2013-02-12 NOTE — Progress Notes (Signed)
Patient's hemoglobin returned 6.7 tonight. I called and spoke with Mrs. Maisie Fus, who did give consent for her husband to receive a transfusion of pRBCs.   Melodye Ped, PA-C 02/12/1013, 12:45 AM

## 2013-02-12 NOTE — Progress Notes (Signed)
Physical Therapy Treatment Patient Details Name: FRIEND DORFMAN MRN: [id number] DOB: 18-Feb-1924 Today's Date: 02/12/2013 Time: 4098-1191 PT Time Calculation (min): 31 min  PT Assessment / Plan / Recommendation  History of Present Illness ISAIC SYLER is a 79 y.o. male with Dementia and Multiple medical problems including DM2 who was brought to the ED after he suffered a fall and had increased LLQ ABD pain afterward.  His wife called EMS and he was brought to the ED and he was evaluated and had a CT scan which revealed a large 12 x 7 cm Hematoma of the Rectus Sheath.  Pt had just been d/c from hospital the day before after treatment for bronchitis   PT Comments   Pt talking nonsensibly at times but able to answer simple questions. Continue PT  Follow Up Recommendations  SNF     Does the patient have the potential to tolerate intense rehabilitation     Barriers to Discharge        Equipment Recommendations  None recommended by PT    Recommendations for Other Services    Frequency Min 3X/week   Progress towards PT Goals Progress towards PT goals: Progressing toward goals  Plan Current plan remains appropriate    Precautions / Restrictions Precautions Precautions: Fall Precaution Comments: pt with hematoma of rectus abdominus muscle    Pertinent Vitals/Pain     Mobility  Bed Mobility Bed Mobility: Supine to Sit;Sit to Supine Supine to Sit: HOB elevated;3: Mod assist Sit to Supine: HOB elevated;3: Mod assist Details for Bed Mobility Assistance: maintained log rolling to protect abdomen Transfers Sit to Stand: 3: Mod assist;From bed;From toilet;With upper extremity assist;4: Min assist Stand to Sit: 4: Min assist;To bed;To toilet;3: Mod assist Details for Transfer Assistance: Assist to rise, stabilie, control descent. Multimodal cues for safety, technique, hand placement. Pt tends to want to pull up on walker. much more difficulty from low  toilet. Ambulation/Gait Ambulation/Gait Assistance: 4: Min assist Ambulation Distance (Feet): 15 Feet (x2) Assistive device: Rolling walker Ambulation/Gait Assistance Details: extra time required to get steady upon standing. assist with RW around door frame and turns.  Gait Pattern: Step-through pattern;Decreased step length - right;Decreased step length - left;Trunk flexed Gait velocity: decreased General Gait Details: Pt is unable to to correct technique for safety and is limited in distance. He needs hands on assist    Exercises     PT Diagnosis:    PT Problem List:   PT Treatment Interventions:     PT Goals (current goals can now be found in the care plan section)    Visit Information  Last PT Received On: 02/12/13 Assistance Needed: +1 History of Present Illness: PRESS CASALE is a 79 y.o. male with Dementia and Multiple medical problems including DM2 who was brought to the ED after he suffered a fall and had increased LLQ ABD pain afterward.  His wife called EMS and he was brought to the ED and he was evaluated and had a CT scan which revealed a large 12 x 7 cm Hematoma of the Rectus Sheath.  Pt had just been d/c from hospital the day before after treatment for bronchitis    Subjective Data      Cognition  Cognition Arousal/Alertness: Awake/alert Overall Cognitive Status: Impaired/Different from baseline Area of Impairment: Orientation;Attention;Safety/judgement;Awareness Orientation Level: Place;Time;Situation Current Attention Level: Selective General Comments: Pt speaking nonsensible words and phrases- like an unidentifiable  foreign languanguage    Balance  Static Standing Balance Static Standing -  Level of Assistance: 4: Min assist;3: Mod assist Static Standing - Comment/# of Minutes: more assist for balance after first standing up.  End of Session PT - End of Session Equipment Utilized During Treatment: Gait belt Activity Tolerance: Patient tolerated treatment  well Patient left: in chair;with call bell/phone within reach;with chair alarm set Nurse Communication: Mobility status   GP     Rada Hay 02/12/2013, 1:49 PM Blanchard Kelch PT [phone]

## 2013-02-12 NOTE — Progress Notes (Signed)
[facility] PROGRESS NOTE  Albert Nelson:[id number] DOB: [birth year] DOA: 02/10/2013 PCP: Laurena Slimmer, MD  Assessment/Plan: Large Rectus Sheath Hematoma -Following a fall after he had been discharged 12/5. -Follow Hb and transfuse for Hb <7.  ABLA -Hb dropped to 6.7 overnight. -Was given 1 unit of PRBCs. Hb is 7.9 post transfusion. -Continue to follow Hb and monitor for need of repeat transfusion.  Deconditioning -PT has recommended SNF. -Please note that on prior admission PT was recommended, however patient's wife refused to have anything set up.  CAD -Stable. No chest pain. -ASA/Plavix are on hold given primary issue. -His stents are remote.  Acute Bronchitis -Recent Dx by his PCP. -Continue steroid taper and levaquin.  Chronic Diastolic CHF -Compensated.  Code Status: Full Code Family Communication: Patient only  Disposition Plan: SNF   Consultants:  None   Antibiotics:  Levaquin  Subjective: Continues to complain of abdominal pain at site of hematoma.  Objective: Filed Vitals:   02/12/13 0315 02/12/13 0415 02/12/13 0445 02/12/13 0659  BP: 106/62 104/60 120/60   Pulse: 62 60 62   Temp: 97.9 F (36.6 C) 97.8 F (36.6 C) 97.6 F (36.4 C)   TempSrc: Oral     Resp: 18 16 18    Height:      Weight:    94.6 kg (208 lb 8.9 oz)  SpO2: 100% 100% 100%     Intake/Output Summary (Last 24 hours) at 02/12/13 0926 Last data filed at 02/12/13 0500  Gross per 24 hour  Intake   1750 ml  Output    625 ml  Net   1125 ml   Filed Weights   02/10/13 2034 02/12/13 0659  Weight: 91.2 kg (201 lb 1 oz) 94.6 kg (208 lb 8.9 oz)    Exam:   General:  AA Ox3  Cardiovascular: RRR  Respiratory: CTA B  Abdomen: S/ND/+BS  Extremities: 1+ edema   Neurologic:  Non-focal  Data Reviewed: Basic Metabolic Panel:  Recent Labs Lab 02/08/13 0357 02/09/13 0504 02/10/13 1630 02/11/13 0450 02/12/13 0532  NA 134* 138 134* 141 140  K 5.2* 4.3 4.6  4.4 4.2  CL 102 106 101 109 110  CO2 20 21 19 24 25   GLUCOSE 171* 229* 404* 160* 116*  BUN 35* 46* 55* 51* 36*  CREATININE 1.50* 1.54* 1.37* 1.26 1.12  CALCIUM 8.5 8.6 8.9 8.4 8.3*   Liver Function Tests:  Recent Labs Lab 02/07/13 1623 02/10/13 1630  AST 18 29  ALT 13 17  ALKPHOS 81 61  BILITOT 0.4 0.2*  PROT 8.0 7.2  ALBUMIN 3.7 3.2*    Recent Labs Lab 02/10/13 1630  LIPASE 9*   No results found for this basename: AMMONIA,  in the last 168 hours CBC:  Recent Labs Lab 02/07/13 1623 02/08/13 0357 02/10/13 1630 02/11/13 0450 02/11/13 1415 02/11/13 2202 02/12/13 0532  WBC 15.1* 13.3* 19.2* 15.2*  --   --  12.5*  NEUTROABS 9.2*  --   --   --   --   --   --   HGB 11.4* 9.5* 9.1* 7.7* 7.5* 6.7* 7.9*  HCT 34.0* 28.8* 27.1* 22.4* 22.3* 19.7* 23.7*  MCV 95.5 94.1 94.8 94.5  --   --  86.2  PLT 248 198 268 213  --   --  193   Cardiac Enzymes:  Recent Labs Lab 02/07/13 1623 02/07/13 2150  TROPONINI <0.30 <0.30   BNP (last 3 results) No results found for this basename: PROBNP,  in  the last 8760 hours CBG:  Recent Labs Lab 02/11/13 0723 02/11/13 1147 02/11/13 1719 02/11/13 2130 02/12/13 0740  GLUCAP 125* 108* 236* 182* 79    No results found for this or any previous visit (from the past 240 hour(s)).   Studies: Ct Abdomen Pelvis Wo Contrast  02/10/2013   CLINICAL DATA:  Left lower quadrant pain  EXAM: CT ABDOMEN AND PELVIS WITHOUT CONTRAST  TECHNIQUE: Multidetector CT imaging of the abdomen and pelvis was performed following the standard protocol without intravenous contrast.  COMPARISON:  02/27/2008  FINDINGS: Sagittal images of the spine is shows degenerative changes lumbar spine. Again noted is synovial cyst at L4-L5 level.  Study is limited without IV contrast. Small calcified gallstones are noted within gallbladder. The largest measures about 6 mm. Tiny hiatal hernia.  Fatty replaced pancreas is noted. Unenhanced liver shows no biliary ductal dilatation.  Unenhanced kidneys shows no nephrolithiasis. No hydronephrosis or hydroureter. No calcified ureteral calculi. Atherosclerotic calcifications of abdominal aorta and iliac arteries. No aortic aneurysm.  Moderate stool noted in right colon. No pericecal inflammation. Sigmoid colon diverticula are noted. Scattered diverticula are noted descending colon. No evidence of acute diverticulitis. There is a large probable acute hematoma in left rectus muscle measures 12 by 7.4 cm. Small hemorrhagic products are noted just anterior to the urinary bladder. There is thickening of anterior wall of the urinary bladder. Inflammatory changes cannot be excluded. Clinical correlation is necessary.  Prostate gland is unremarkable. Bilateral mild retroperitoneal stranding without confluent fluid collection. Small amount of fluid is noted in left anterior pelvis.  Oral contrast material was given to the patient. There is no small bowel obstruction. No free abdominal air. No adenopathy.  IMPRESSION: 1. There is large left rectus muscle acute hematoma measures at least 12 x 7.4 cm. Small amount of hemorrhagic products are noted just anterior to urinary bladder. Mild thickening of anterior wall of the urinary bladder probable due to inflammatory changes. 2. No nephrolithiasis.  No hydronephrosis or hydroureter. 3. Distal colonic diverticula.  No evidence of acute diverticulitis. 4. Small calcified gallstones are noted within gallbladder the largest measures 6 mm. Critical Value/emergent results were called by telephone at the time of interpretation on 02/10/2013 at 6:05 PM to Dr.KEVIN [facility] , who verbally acknowledged these results.   Electronically Signed   By: Natasha Mead M.D.   On: 02/10/2013 18:07   Dg Chest 2 View  02/10/2013   CLINICAL DATA:  Cough.  Abdominal pain.  Recent bronchitis.  EXAM: CHEST  2 VIEW  COMPARISON:  02/07/2013.  FINDINGS: The heart remains normal in size. Stable post CABG changes. Clear lungs. Unremarkable bones.   IMPRESSION: No acute abnormality.   Electronically Signed   By: Gordan Payment M.D.   On: 02/10/2013 19:18    Scheduled Meds: . allopurinol  100 mg Oral Daily  . atorvastatin  20 mg Oral q1800  . beta carotene w/minerals  1 tablet Oral Daily  . darifenacin  15 mg Oral Daily  . diltiazem  180 mg Oral Daily  . dutasteride  0.5 mg Oral Daily  . glimepiride  4 mg Oral QAC breakfast  . insulin aspart  0-5 Units Subcutaneous QHS  . insulin aspart  0-9 Units Subcutaneous TID WC  . irbesartan  150 mg Oral Daily  . levofloxacin  250 mg Oral Daily  . omega-3 acid ethyl esters  1 g Oral Daily  . pantoprazole  40 mg Oral Daily  . predniSONE  30 mg Oral  Q breakfast   Continuous Infusions: . sodium chloride 20 mL/hr at 02/10/13 1703  . sodium chloride      Principal Problem:   Rectus sheath hematoma Active Problems:   Acute blood loss anemia   DM (diabetes mellitus)   Hyperlipidemia   Dementia   GERD (gastroesophageal reflux disease)   Chronic diastolic CHF (congestive heart failure), NYHA class 2   Bronchitis   CKD (chronic kidney disease) stage 3, GFR 30-59 ml/min   Hyperglycemia   Falls frequently    Time spent: 35 minutes.  Greater than 50% of this time was spent in direct contact with the patient coordinating care.    Chaya Jan  [facility] Pager [phone]  If 7PM-7AM, please contact night-coverage at [email or web address], password [facility] 02/12/2013, 9:26 AM  LOS: 2 days

## 2013-02-12 NOTE — Progress Notes (Signed)
Inpatient Diabetes Program Recommendations  AACE/ADA: New Consensus Statement on Inpatient Glycemic Control (2013)  Target Ranges:  Prepandial:   less than 140 mg/dL      Peak postprandial:   less than 180 mg/dL (1-2 hours)      Critically ill patients:  140 - 180 mg/dL   Reason for Visit: Results for Albert Nelson, Albert Nelson (MRN [id number]) as of 02/12/2013 14:23  Ref. Range 02/12/2013 07:40 02/12/2013 11:39  Glucose-Capillary Latest Range: 70-99 mg/dL 79 90   CBG's less than 100 mg/dL today.  Consider holding Amaryl while patient is in the hospital to prevent hypoglycemia.  Thanks, Beryl Meager, RN, BC-ADM Inpatient Diabetes Coordinator Pager [phone]

## 2013-02-12 NOTE — Progress Notes (Signed)
Nutrition Brief Note  Patient identified on the Malnutrition Screening Tool (MST) Report  Wt Readings from Last 15 Encounters:  02/12/13 208 lb 8.9 oz (94.6 kg)  02/07/13 197 lb 5 oz (89.5 kg)  12/12/12 192 lb 8 oz (87.317 kg)  09/17/12 197 lb 8.5 oz (89.6 kg)  07/19/11 187 lb 2.7 oz (84.9 kg)    Body mass index is 33.68 kg/(m^2). Patient meets criteria for class I obesity based on current BMI.   Current diet order is CHO modified, patient is consuming approximately 50-85% of meals at this time. Labs and medications reviewed. Admitted with weakness and bronchitis. Pt with dementia, wife at bedside. She reports pt eating well PTA, 3 meals/day and drinks Boost daily. Weight going up. Will order Boost Plus daily.   No further nutrition interventions warranted at this time. If nutrition issues arise, please consult RD.   Levon Hedger MS, RD, LDN [phone] Pager [phone] After Hours Pager

## 2013-02-13 LAB — GLUCOSE, CAPILLARY
Glucose-Capillary: 126 mg/dL — ABNORMAL HIGH (ref 70–99)
Glucose-Capillary: 151 mg/dL — ABNORMAL HIGH (ref 70–99)
Glucose-Capillary: 245 mg/dL — ABNORMAL HIGH (ref 70–99)
Glucose-Capillary: 245 mg/dL — ABNORMAL HIGH (ref 70–99)

## 2013-02-13 LAB — CBC
HCT: 22.3 % — ABNORMAL LOW (ref 39.0–52.0)
Hemoglobin: 7.6 g/dL — ABNORMAL LOW (ref 13.0–17.0)
MCHC: 34.1 g/dL (ref 30.0–36.0)
MCV: 85.8 fL (ref 78.0–100.0)
RBC: 2.6 MIL/uL — ABNORMAL LOW (ref 4.22–5.81)
RDW: 22.6 % — ABNORMAL HIGH (ref 11.5–15.5)
WBC: 13 10*3/uL — ABNORMAL HIGH (ref 4.0–10.5)

## 2013-02-13 LAB — BASIC METABOLIC PANEL
BUN: 33 mg/dL — ABNORMAL HIGH (ref 6–23)
CO2: 23 mEq/L (ref 19–32)
Chloride: 108 mEq/L (ref 96–112)
Creatinine, Ser: 1.05 mg/dL (ref 0.50–1.35)
GFR calc Af Amer: 72 mL/min — ABNORMAL LOW (ref >90)
Potassium: 4.2 mEq/L (ref 3.5–5.1)

## 2013-02-13 MED ORDER — PREDNISONE 20 MG PO TABS
20.0000 mg | ORAL_TABLET | Freq: Every day | ORAL | Status: DC
  Administered 2013-02-14: 20 mg via ORAL
  Filled 2013-02-13 (×2): qty 1

## 2013-02-13 NOTE — Progress Notes (Signed)
CSW continuing to follow for pt disposition planning.   CSW met with pt and pt wife at bedside.  CSW discussed that PT continues to recommend SNF for pt prior to returning home.  Pt wife expressed understanding and stated that pt has been at [facility] and [facility] before and pt wife would like for pt to return to [facility] [place] upon discharge.  CSW spoke with [facility] who confirmed that facility can accept pt when pt medically stable for discharge.  CSW to continue to follow and facilitate pt discharge need to [facility] when pt medically stable for discharge.  Jacklynn Lewis, MSW, LCSWA  Clinical Social Work [phone]

## 2013-02-13 NOTE — Progress Notes (Addendum)
[facility] PROGRESS NOTE  Albert Nelson JXB:[id number] DOB: 1923-08-15 DOA: 02/10/2013 PCP: Laurena Slimmer, MD  Assessment/Plan: Large Rectus Sheath Hematoma -Following a fall after he had been discharged 12/5. -Follow Hb and transfuse for Hb <7.  ABLA -Was transfused 1 unit of PRBCs on 12/8. -Continue to follow Hb and monitor for need of repeat transfusion.  Deconditioning -PT has recommended SNF. -Please note that on prior admission PT was recommended, however patient's wife refused to have anything set up.  CAD -Stable. No chest pain. -ASA/Plavix are on hold given primary issue. -His stents are remote.  Acute Bronchitis -Recent Dx by his PCP. -Continue steroid taper and levaquin.  Chronic Diastolic CHF -Compensated.  Code Status: Full Code Family Communication: Discussed with wife Janelle Floor via telephone. Disposition Plan: SNF. Anticipate DC SNF in 24 hours.   Consultants:  None   Antibiotics:  Levaquin  Subjective: Continues to complain of abdominal pain at site of hematoma.  Objective: Filed Vitals:   02/12/13 1831 02/12/13 2132 02/13/13 0246 02/13/13 0626  BP: 125/50 118/53 118/56 117/50  Pulse: 74 62 57 57  Temp: 98.1 F (36.7 C) 98.8 F (37.1 C) 98 F (36.7 C) 97.8 F (36.6 C)  TempSrc: Oral Oral Oral Oral  Resp: 18 20 20 20   Height:      Weight:      SpO2: 99% 98% 100% 100%    Intake/Output Summary (Last 24 hours) at 02/13/13 1055 Last data filed at 02/13/13 1308  Gross per 24 hour  Intake   2565 ml  Output   1025 ml  Net   1540 ml   Filed Weights   02/10/13 2034 02/12/13 0659  Weight: 91.2 kg (201 lb 1 oz) 94.6 kg (208 lb 8.9 oz)    Exam:   General:  AA Ox3  Cardiovascular: RRR  Respiratory: CTA B  Abdomen: S/ND/+BS  Extremities: 1+ edema   Neurologic:  Non-focal  Data Reviewed: Basic Metabolic Panel:  Recent Labs Lab 02/09/13 0504 02/10/13 1630 02/11/13 0450 02/12/13 0532 02/13/13 0445  NA 138 134* 141  140 139  K 4.3 4.6 4.4 4.2 4.2  CL 106 101 109 110 108  CO2 21 19 24 25 23   GLUCOSE 229* 404* 160* 116* 182*  BUN 46* 55* 51* 36* 33*  CREATININE 1.54* 1.37* 1.26 1.12 1.05  CALCIUM 8.6 8.9 8.4 8.3* 8.1*   Liver Function Tests:  Recent Labs Lab 02/07/13 1623 02/10/13 1630  AST 18 29  ALT 13 17  ALKPHOS 81 61  BILITOT 0.4 0.2*  PROT 8.0 7.2  ALBUMIN 3.7 3.2*    Recent Labs Lab 02/10/13 1630  LIPASE 9*   No results found for this basename: AMMONIA,  in the last 168 hours CBC:  Recent Labs Lab 02/07/13 1623 02/08/13 0357 02/10/13 1630 02/11/13 0450 02/11/13 1415 02/11/13 2202 02/12/13 0532 02/13/13 0445  WBC 15.1* 13.3* 19.2* 15.2*  --   --  12.5* 13.0*  NEUTROABS 9.2*  --   --   --   --   --   --   --   HGB 11.4* 9.5* 9.1* 7.7* 7.5* 6.7* 7.9* 7.6*  HCT 34.0* 28.8* 27.1* 22.4* 22.3* 19.7* 23.7* 22.3*  MCV 95.5 94.1 94.8 94.5  --   --  86.2 85.8  PLT 248 198 268 213  --   --  193 192   Cardiac Enzymes:  Recent Labs Lab 02/07/13 1623 02/07/13 2150  TROPONINI <0.30 <0.30   BNP (last 3 results) No  results found for this basename: PROBNP,  in the last 8760 hours CBG:  Recent Labs Lab 02/12/13 0740 02/12/13 1139 02/12/13 1655 02/12/13 2126 02/13/13 0753  GLUCAP 79 90 151* 251* 126*    No results found for this or any previous visit (from the past 240 hour(s)).   Studies: No results found.  Scheduled Meds: . allopurinol  100 mg Oral Daily  . atorvastatin  20 mg Oral q1800  . beta carotene w/minerals  1 tablet Oral Daily  . darifenacin  15 mg Oral Daily  . diltiazem  180 mg Oral Daily  . dutasteride  0.5 mg Oral Daily  . glimepiride  4 mg Oral QAC breakfast  . insulin aspart  0-5 Units Subcutaneous QHS  . insulin aspart  0-9 Units Subcutaneous TID WC  . irbesartan  150 mg Oral Daily  . lactose free nutrition  237 mL Oral Q1400  . levofloxacin  250 mg Oral Daily  . omega-3 acid ethyl esters  1 g Oral Daily  . pantoprazole  40 mg Oral Daily   . predniSONE  30 mg Oral Q breakfast   Continuous Infusions: . sodium chloride 75 mL/hr at 02/13/13 0200    Principal Problem:   Rectus sheath hematoma Active Problems:   Acute blood loss anemia   DM (diabetes mellitus)   Hyperlipidemia   Dementia   GERD (gastroesophageal reflux disease)   Chronic diastolic CHF (congestive heart failure), NYHA class 2   Bronchitis   CKD (chronic kidney disease) stage 3, GFR 30-59 ml/min   Hyperglycemia   Falls frequently    Time spent: 35 minutes.  Greater than 50% of this time was spent in direct contact with the patient coordinating care.    Chaya Jan  [facility] Pager [phone]  If 7PM-7AM, please contact night-coverage at [email or web address], password [facility] 02/13/2013, 10:55 AM  LOS: 3 days

## 2013-02-14 LAB — TYPE AND SCREEN
ABO/RH(D): B POS
Antibody Screen: NEGATIVE
Unit division: 0

## 2013-02-14 LAB — GLUCOSE, CAPILLARY
Glucose-Capillary: 91 mg/dL (ref 70–99)
Glucose-Capillary: 166 mg/dL — ABNORMAL HIGH (ref 70–99)

## 2013-02-14 LAB — BASIC METABOLIC PANEL
BUN: 28 mg/dL — ABNORMAL HIGH (ref 6–23)
Calcium: 8.2 mg/dL — ABNORMAL LOW (ref 8.4–10.5)
Chloride: 107 mEq/L (ref 96–112)
Creatinine, Ser: 0.89 mg/dL (ref 0.50–1.35)
GFR calc Af Amer: 87 mL/min — ABNORMAL LOW (ref >90)
GFR calc non Af Amer: 75 mL/min — ABNORMAL LOW (ref >90)
Potassium: 3.8 mEq/L (ref 3.5–5.1)
Sodium: 137 mEq/L (ref 135–145)

## 2013-02-14 LAB — CBC
HCT: 23.8 % — ABNORMAL LOW (ref 39.0–52.0)
Hemoglobin: 7.8 g/dL — ABNORMAL LOW (ref 13.0–17.0)
MCH: 28.4 pg (ref 26.0–34.0)
MCHC: 32.8 g/dL (ref 30.0–36.0)
RBC: 2.75 MIL/uL — ABNORMAL LOW (ref 4.22–5.81)
WBC: 15.5 10*3/uL — ABNORMAL HIGH (ref 4.0–10.5)

## 2013-02-14 MED ORDER — ONDANSETRON HCL 4 MG PO TABS: 4.0000 mg | ORAL_TABLET | Freq: Four times a day (QID) | ORAL | Status: DC | PRN

## 2013-02-14 MED ORDER — ZOLPIDEM TARTRATE 5 MG PO TABS: 5.0000 mg | ORAL_TABLET | Freq: Every evening | ORAL | Status: DC | PRN

## 2013-02-14 MED ORDER — BOOST PLUS PO LIQD: 237.0000 mL | Freq: Every day | ORAL | Status: DC

## 2013-02-14 MED ORDER — ALUM & MAG HYDROXIDE-SIMETH 200-200-20 MG/5ML PO SUSP: 30.0000 mL | Freq: Four times a day (QID) | ORAL | Status: DC | PRN

## 2013-02-14 MED ORDER — PREDNISONE 5 MG PO TABS: 15.0000 mg | ORAL_TABLET | Freq: Every day | ORAL | Status: DC

## 2013-02-14 MED ORDER — DARIFENACIN HYDROBROMIDE ER 15 MG PO TB24: 15.0000 mg | ORAL_TABLET | Freq: Every day | ORAL | Status: DC

## 2013-02-14 MED ORDER — OXYCODONE HCL 5 MG PO TABS: 5.0000 mg | ORAL_TABLET | ORAL | Status: DC | PRN

## 2013-02-14 NOTE — Progress Notes (Signed)
Pt for discharge to [facility] and [facility].  CSW facilitated pt discharge needs including contacting facility, faxing pt discharge information via [facility], discussing with pt and pt wife at bedside, providing RN phone number to call report, and arranging ambulance transport for pt to [facility] and [facility].   No further social work needs identified at this time.  CSW signing off.   Jacklynn Lewis, MSW, LCSWA  Clinical Social Work [phone]

## 2013-02-14 NOTE — Discharge Summary (Signed)
Physician Discharge Summary  Albert Nelson EXB:[id number] DOB: Jul 02, 1923 DOA: 02/10/2013  PCP: Laurena Slimmer, MD  Admit date: 02/10/2013 Discharge date: 02/14/2013  Recommendations for Outpatient Follow-up:  1. Please avoid aspirin, plavix and nabumetone at the time of discharge due to risk of worsening rectal sheet hematoma. Please recheck CBC in next 2-3 days to make sure it is stable. Transfusion threshold is for hemoglobin less than 7. 2. Taper prednisone from 15 mg a day down to 0 mg by 5 mg a day. 3. Kidney function is within normal limits at the time of discharge. 4. Monitor CBG per nursing home protocol. May continue Amaryl but please monitor for hypoglycemia and if CBG's less than 80-90 then consider holding Amaryl.  Discharge Diagnoses:  Principal Problem:   Rectus sheath hematoma Active Problems:   DM (diabetes mellitus)   Hyperlipidemia   Dementia   GERD (gastroesophageal reflux disease)   Chronic diastolic CHF (congestive heart failure), NYHA class 2   Bronchitis   CKD (chronic kidney disease) stage 3, GFR 30-59 ml/min   Hyperglycemia   Falls frequently   Acute blood loss anemia    Discharge Condition: medically stable for discharge to SNF today; follow up instructions outlined above  Diet recommendation: as tolerated, heart healthy.  History of present illness:  79 y.o. male with history of dementia, DM2, CAD s/p CABG and 2 stents placed, on aspirin and plavix who was [facility] to [facility] ED 02/10/2013 after he suffered a fall and increased LLQ ABD pain. CT scan revealed a large 12 x 7 cm hematoma of the Rectus Sheath. Pt also had increased cough and weakness for the past week prior to this admission.  Assessment/Plan:   Principal Problem: Acute blood loss anemia related to large Rectus Sheath Hematoma  - sustained status post fall   - hemoglobin 7.8 this am - transfusion threshold is hemoglobin less than 7 - continue to hold aspirin and plavix  Active  Problems: Deconditioning  - PT has recommended SNF. Pt to return to SNF today CAD  - Stable. No chest pain.  - ASA/Plavix are on hold given primary issue.  - His stents are remote.  Acute Bronchitis  - had Levaquin or 5 days in hospital so no need to continue beyond today - taper steroids as mentioned above Chronic Diastolic CHF  - Compensated.   Code Status: Full Code  Family Communication: Family not at the bedside Disposition Plan: To SNF today    Signed:  Manson Passey, MD  [facility] 02/14/2013, 10:25 AM  Pager #: [phone]   Discharge Exam: Filed Vitals:   02/14/13 0625  BP: 123/62  Pulse: 60  Temp: 98.2 F (36.8 C)  Resp: 20   Filed Vitals:   02/13/13 1400 02/13/13 1750 02/13/13 2135 02/14/13 0625  BP: 177/54 138/53 127/57 123/62  Pulse: 67 69 66 60  Temp: 98 F (36.7 C) 98.3 F (36.8 C) 98.2 F (36.8 C) 98.2 F (36.8 C)  TempSrc: Oral Oral Oral Oral  Resp: 18 20 16 20   Height:      Weight:      SpO2: 97% 98% 99% 97%    General: Pt is alert, intermittently confused, not in acute distress Cardiovascular: Regular rate and rhythm, S1/S2 appreciated Respiratory: Clear to auscultation bilaterally, no wheezing, no crackles, no rhonchi Abdominal: Soft, non tender, non distended, bowel sounds +, no guarding Extremities: no edema, no cyanosis, pulses palpable bilaterally DP and PT Neuro: Grossly nonfocal  Discharge Instructions  Discharge  Orders   Future Orders Complete By Expires   Call MD for:  difficulty breathing, headache or visual disturbances  As directed    Call MD for:  persistant dizziness or light-headedness  As directed    Call MD for:  persistant nausea and vomiting  As directed    Call MD for:  severe uncontrolled pain  As directed    Diet - low sodium heart healthy  As directed    Discharge instructions  As directed    Comments:     1. Please avoid aspirin, plavix and nabumetone at the time of discharge due to risk of  worsening rectal sheet hematoma. Please recheck CBC in next 2-3 days to make sure it is stable. Transfusion threshold is for hemoglobin less than 7. 2. Taper prednisone from 15 mg a day down to 0 mg by 5 mg a day. 3. Kidney function is within normal limits at the time of discharge. 4. Monitor CBG per nursing home protocol. May continue Amaryl but please monitor for hypoglycemia and if CBG's less than 80-90 then consider holding Amaryl.   Increase activity slowly  As directed        Medication List    STOP taking these medications       aspirin EC 81 MG tablet     clopidogrel 75 MG tablet  Commonly known as:  PLAVIX     levofloxacin 250 MG tablet  Commonly known as:  LEVAQUIN     nabumetone 500 MG tablet  Commonly known as:  RELAFEN     solifenacin 10 MG tablet  Commonly known as:  VESICARE  Replaced by:  darifenacin 15 MG 24 hr tablet      TAKE these medications       acetaminophen 500 MG tablet  Commonly known as:  TYLENOL  Take 2 tablets (1,000 mg total) by mouth 2 (two) times daily.     allopurinol 100 MG tablet  Commonly known as:  ZYLOPRIM  Take 100 mg by mouth daily.     alum & mag hydroxide-simeth 200-200-20 MG/5ML suspension  Commonly known as:  MAALOX/MYLANTA  Take 30 mLs by mouth every 6 (six) hours as needed for indigestion or heartburn (dyspepsia).     beta carotene w/minerals tablet  Take 1 tablet by mouth daily.     CITRUCEL PO  Take 2 tablets by mouth daily.     darifenacin 15 MG 24 hr tablet  Commonly known as:  ENABLEX  Take 1 tablet (15 mg total) by mouth daily.     diltiazem 180 MG 24 hr capsule  Commonly known as:  TIAZAC  Take 180 mg by mouth daily.     dutasteride 0.5 MG capsule  Commonly known as:  AVODART  Take 0.5 mg by mouth daily.     Fish Oil 1200 MG Caps  Take 1 capsule by mouth daily.     glimepiride 4 MG tablet  Commonly known as:  AMARYL  Take 4 mg by mouth daily before breakfast.     guaiFENesin 600 MG 12 hr tablet   Commonly known as:  MUCINEX  Take 1 tablet (600 mg total) by mouth 2 (two) times daily as needed for cough.     lactose free nutrition Liqd  Take 237 mLs by mouth daily at 2 PM daily at 2 PM.     lansoprazole 30 MG capsule  Commonly known as:  PREVACID  Take 30 mg by mouth daily.     ondansetron  4 MG tablet  Commonly known as:  ZOFRAN  Take 1 tablet (4 mg total) by mouth every 6 (six) hours as needed for nausea.     oxyCODONE 5 MG immediate release tablet  Commonly known as:  Oxy IR/ROXICODONE  Take 1 tablet (5 mg total) by mouth every 4 (four) hours as needed for moderate pain.     predniSONE 5 MG tablet  Commonly known as:  DELTASONE  Take 3 tablets (15 mg total) by mouth daily with breakfast. 2 tabs x 1 day then 1 tab x 2 days hen 0.5 tabs x 2 days then stop.     rosuvastatin 10 MG tablet  Commonly known as:  CRESTOR  Take 10 mg by mouth daily.     valsartan 160 MG tablet  Commonly known as:  DIOVAN  Take 80 mg by mouth daily.     zolpidem 5 MG tablet  Commonly known as:  AMBIEN  Take 1 tablet (5 mg total) by mouth at bedtime as needed for sleep (insomnia).           Follow-up Information   Follow up with Laurena Slimmer, MD. Schedule an appointment as soon as possible for a visit in 1 week.   Specialty:  Internal Medicine   Contact information:   [address] [place] [address] [phone]        The results of significant diagnostics from this hospitalization (including imaging, microbiology, ancillary and laboratory) are listed below for reference.    Significant Diagnostic Studies: Ct Abdomen Pelvis Wo Contrast  02/10/2013   CLINICAL DATA:  Left lower quadrant pain  EXAM: CT ABDOMEN AND PELVIS WITHOUT CONTRAST  TECHNIQUE: Multidetector CT imaging of the abdomen and pelvis was performed following the standard protocol without intravenous contrast.  COMPARISON:  02/27/2008  FINDINGS: Sagittal images of the spine is shows degenerative  changes lumbar spine. Again noted is synovial cyst at L4-L5 level.  Study is limited without IV contrast. Small calcified gallstones are noted within gallbladder. The largest measures about 6 mm. Tiny hiatal hernia.  Fatty replaced pancreas is noted. Unenhanced liver shows no biliary ductal dilatation. Unenhanced kidneys shows no nephrolithiasis. No hydronephrosis or hydroureter. No calcified ureteral calculi. Atherosclerotic calcifications of abdominal aorta and iliac arteries. No aortic aneurysm.  Moderate stool noted in right colon. No pericecal inflammation. Sigmoid colon diverticula are noted. Scattered diverticula are noted descending colon. No evidence of acute diverticulitis. There is a large probable acute hematoma in left rectus muscle measures 12 by 7.4 cm. Small hemorrhagic products are noted just anterior to the urinary bladder. There is thickening of anterior wall of the urinary bladder. Inflammatory changes cannot be excluded. Clinical correlation is necessary.  Prostate gland is unremarkable. Bilateral mild retroperitoneal stranding without confluent fluid collection. Small amount of fluid is noted in left anterior pelvis.  Oral contrast material was given to the patient. There is no small bowel obstruction. No free abdominal air. No adenopathy.  IMPRESSION: 1. There is large left rectus muscle acute hematoma measures at least 12 x 7.4 cm. Small amount of hemorrhagic products are noted just anterior to urinary bladder. Mild thickening of anterior wall of the urinary bladder probable due to inflammatory changes. 2. No nephrolithiasis.  No hydronephrosis or hydroureter. 3. Distal colonic diverticula.  No evidence of acute diverticulitis. 4. Small calcified gallstones are noted within gallbladder the largest measures 6 mm. Critical Value/emergent results were called by telephone at the time of interpretation on 02/10/2013 at 6:05  PM to Dr.KEVIN STEINL , who verbally acknowledged these results.    Electronically Signed   By: Natasha Mead M.D.   On: 02/10/2013 18:07   Dg Chest 2 View  02/10/2013   CLINICAL DATA:  Cough.  Abdominal pain.  Recent bronchitis.  EXAM: CHEST  2 VIEW  COMPARISON:  02/07/2013.  FINDINGS: The heart remains normal in size. Stable post CABG changes. Clear lungs. Unremarkable bones.  IMPRESSION: No acute abnormality.   Electronically Signed   By: Gordan Payment M.D.   On: 02/10/2013 19:18   Dg Chest 2 View  02/07/2013   CLINICAL DATA:  Short of breath, cough  EXAM: CHEST  2 VIEW  COMPARISON:  01/29/2013  FINDINGS: Prior CABG. Negative for heart failure. Negative for pneumonia. Lungs remain clear.  IMPRESSION: No active cardiopulmonary disease.   Electronically Signed   By: Marlan Palau M.D.   On: 02/07/2013 16:08   Dg Chest 2 View  01/29/2013   CLINICAL DATA:  Shortness of breath, chest pain.  EXAM: CHEST  2 VIEW  COMPARISON:  March 18, 2012.  FINDINGS: Status post coronary artery bypass graft. No pneumothorax or pleural effusion is noted. Cardiomediastinal silhouette is normal. No acute pulmonary disease is noted. Bony thorax is intact.  IMPRESSION: No acute cardiopulmonary abnormality seen.   Electronically Signed   By: Roque Lias M.D.   On: 01/29/2013 12:53   Ct Angio Chest Pe W/cm &/or Wo Cm  01/29/2013   CLINICAL DATA:  Proptosis and shortness of breath.  EXAM: CT ANGIOGRAPHY CHEST WITH CONTRAST  TECHNIQUE: Multidetector CT imaging of the chest was performed using the standard protocol during bolus administration of intravenous contrast. Multiplanar CT image reconstructions including MIPs were obtained to evaluate the vascular anatomy.  CONTRAST:  80mL OMNIPAQUE IOHEXOL 350 MG/ML SOLN  COMPARISON:  Abdomen and pelvis CT from 02/27/2008.  FINDINGS: There is no filling defect within the opacified pulmonary arteries to suggest the presence of an acute pulmonary embolus. No thoracic aortic aneurysm. Although the thoracic aorta is not well opacified, there is no discernible  dissection flap within the lumen.  No axillary lymphadenopathy. No mediastinal or hilar lymphadenopathy. Heart size is upper normal. Patient is status post CABG. There is no pericardial or pleural effusion.  3 mm right lower lobe pulmonary nodule is visualized on image 43. No pulmonary edema. No focal airspace consolidation. No pneumothorax.  Images which include the upper abdomen show 1.7 cm lesion in the spleen. This is decreased from 2.6 cm on the previous CT scan, consistent with a benign process. Liver contour appears slightly nodular on today's study raising the question of, but not diagnostic for cirrhosis.  Review of the MIP images confirms the above findings.  IMPRESSION: No CT evidence for acute pulmonary embolus. No findings to explain the patient's history of hemoptysis and shortness of breath.  3 mm right lower lobe pulmonary nodule. If the patient is at high risk for bronchogenic carcinoma, follow-up chest CT at 1year is recommended. If the patient is at low risk, no follow-up is needed. This recommendation follows the consensus statement: Guidelines for Management of Small Pulmonary Nodules Detected on CT Scans: A Statement from the Fleischner Society as published in Radiology 2005; 237:395-400.   Electronically Signed   By: Kennith Center M.D.   On: 01/29/2013 15:28    Microbiology: No results found for this or any previous visit (from the past 240 hour(s)).   Labs: Basic Metabolic Panel:  Recent Labs Lab 02/10/13 1630  02/11/13 0450 02/12/13 0532 02/13/13 0445 02/14/13 0438  NA 134* 141 140 139 137  K 4.6 4.4 4.2 4.2 3.8  CL 101 109 110 108 107  CO2 19 24 25 23 23   GLUCOSE 404* 160* 116* 182* 114*  BUN 55* 51* 36* 33* 28*  CREATININE 1.37* 1.26 1.12 1.05 0.89  CALCIUM 8.9 8.4 8.3* 8.1* 8.2*   Liver Function Tests:  Recent Labs Lab 02/07/13 1623 02/10/13 1630  AST 18 29  ALT 13 17  ALKPHOS 81 61  BILITOT 0.4 0.2*  PROT 8.0 7.2  ALBUMIN 3.7 3.2*    Recent Labs Lab  02/10/13 1630  LIPASE 9*   No results found for this basename: AMMONIA,  in the last 168 hours CBC:  Recent Labs Lab 02/07/13 1623  02/10/13 1630 02/11/13 0450 02/11/13 1415 02/11/13 2202 02/12/13 0532 02/13/13 0445 02/14/13 0438  WBC 15.1*  < > 19.2* 15.2*  --   --  12.5* 13.0* 15.5*  NEUTROABS 9.2*  --   --   --   --   --   --   --   --   HGB 11.4*  < > 9.1* 7.7* 7.5* 6.7* 7.9* 7.6* 7.8*  HCT 34.0*  < > 27.1* 22.4* 22.3* 19.7* 23.7* 22.3* 23.8*  MCV 95.5  < > 94.8 94.5  --   --  86.2 85.8 86.5  PLT 248  < > 268 213  --   --  193 192 214  < > = values in this interval not displayed. Cardiac Enzymes:  Recent Labs Lab 02/07/13 1623 02/07/13 2150  TROPONINI <0.30 <0.30   BNP: BNP (last 3 results) No results found for this basename: PROBNP,  in the last 8760 hours CBG:  Recent Labs Lab 02/13/13 0753 02/13/13 1152 02/13/13 1733 02/13/13 2139 02/14/13 0731  GLUCAP 126* 151* 245* 245* 91    Time coordinating discharge: Over 30 minutes

## 2013-02-14 NOTE — Progress Notes (Signed)
Physical Therapy Treatment Patient Details Name: Albert Nelson MRN: [id number] DOB: Mar 13, 1923 Today's Date: 02/14/2013 Time: 6295-2841 PT Time Calculation (min): 26 min  PT Assessment / Plan / Recommendation  History of Present Illness Albert Nelson is a 79 y.o. male with Dementia and Multiple medical problems including DM2 who was brought to the ED after he suffered a fall and had increased LLQ ABD pain afterward.  His wife called EMS and he was brought to the ED and he was evaluated and had a CT scan which revealed a large 12 x 7 cm Hematoma of the Rectus Sheath.  Pt had just been d/c from hospital the day before after treatment for bronchitis   PT Comments   Pt is speaking non English more than 2 days ago. RN reports Spanish yesterday.  Pt with noted cough, appears to have food in his mouth. Pt required much more assistance today. On 12/7 pt required only min assist to ambulate. Today pt unable to ambulate without extensive assistance.. Pt also appears to have decreased eye control- almost like "Lazy eye". Pt does not report double vision.   Follow Up Recommendations  SNF     Does the patient have the potential to tolerate intense rehabilitation     Barriers to Discharge        Equipment Recommendations  None recommended by PT    Recommendations for Other Services    Frequency Min 3X/week   Progress towards PT Goals Progress towards PT goals: Not progressing toward goals - comment (decline in function since eval on 02/11/13.)  Plan Current plan remains appropriate    Precautions / Restrictions Precautions Precautions: Fall Precaution Comments: pt with hematoma of rectus abdominus muscle    Pertinent Vitals/Pain Pt indicates he hurts all over.    Mobility  Bed Mobility Bed Mobility: Supine to Sit;Sit to Supine Supine to Sit: HOB elevated;1: +2 Total assist Supine to Sit: Patient Percentage: 30% Details for Bed Mobility Assistance: Pt reuired much more assistance today,  delayed in following commands for mobility. Once in sitting , pt did participate in scooting to the edge of the bed. Transfers Transfers: Sit to Stand;Stand to Sit Sit to Stand: 1: +2 Total assist;From bed Sit to Stand: Patient Percentage: 60% Stand to Sit: To chair/3-in-1;1: +2 Total assist Stand to Sit: Patient Percentage: 60% Details for Transfer Assistance: pt required lifting assist to stand today. Ambulation/Gait Ambulation/Gait Assistance: 1: +2 Total assist Ambulation Distance (Feet): 5 Feet Assistive device: Rolling walker Ambulation/Gait Assistance Details: frequent tactile cues for taking a few steps over to the chair.    Exercises     PT Diagnosis:    PT Problem List:   PT Treatment Interventions:     PT Goals (current goals can now be found in the care plan section)    Visit Information  Last PT Received On: 02/14/13 Assistance Needed: +2 History of Present Illness: Albert Nelson is a 79 y.o. male with Dementia and Multiple medical problems including DM2 who was brought to the ED after he suffered a fall and had increased LLQ ABD pain afterward.  His wife called EMS and he was brought to the ED and he was evaluated and had a CT scan which revealed a large 12 x 7 cm Hematoma of the Rectus Sheath.  Pt had just been d/c from hospital the day before after treatment for bronchitis    Subjective Data      Cognition  Cognition Arousal/Alertness: Lethargic Overall Cognitive Status:  Impaired/Different from baseline Area of Impairment: Orientation;Attention;Following commands General Comments: Pt speaking nonsensible words and phrases- like an unidentifiable  foreign languanguage. RN reports some of language is aSpanish. Pt perseverative on [place]" after asking pt if he was in service. Pt stated "[employer] [facility] [employer]. . RN reports pt speaking Spanish more yesterday..     Balance     End of Session PT - End of Session Equipment Utilized During Treatment: Gait  belt Activity Tolerance: Treatment limited secondary to medical complications (Comment) Patient left: with call bell/phone within reach;with chair alarm set Nurse Communication: Mobility status   GP     Rada Hay 02/14/2013, 9:58 AM Blanchard Kelch PT [phone]

## 2013-02-17 ENCOUNTER — Other Ambulatory Visit: Payer: Self-pay | Admitting: Cardiology

## 2013-02-19 NOTE — Telephone Encounter (Signed)
Rx was sent to pharmacy electronically. 

## 2013-04-22 ENCOUNTER — Emergency Department (HOSPITAL_COMMUNITY): Payer: Medicare Other

## 2013-04-22 ENCOUNTER — Encounter (HOSPITAL_COMMUNITY): Payer: Self-pay | Admitting: Emergency Medicine

## 2013-04-22 ENCOUNTER — Inpatient Hospital Stay (HOSPITAL_COMMUNITY)
Admission: EM | Admit: 2013-04-22 | Discharge: 2013-04-26 | DRG: 682 | Disposition: A | Discharge status: Skilled Nursing Facility | Payer: Medicare Other | Attending: Internal Medicine | Admitting: Internal Medicine

## 2013-04-22 DIAGNOSIS — G9341 Metabolic encephalopathy: Secondary | ICD-10-CM | POA: Diagnosis present

## 2013-04-22 DIAGNOSIS — I2510 Atherosclerotic heart disease of native coronary artery without angina pectoris: Secondary | ICD-10-CM | POA: Diagnosis present

## 2013-04-22 DIAGNOSIS — D638 Anemia in other chronic diseases classified elsewhere: Secondary | ICD-10-CM | POA: Diagnosis present

## 2013-04-22 DIAGNOSIS — N179 Acute kidney failure, unspecified: Principal | ICD-10-CM | POA: Diagnosis present

## 2013-04-22 DIAGNOSIS — I5032 Chronic diastolic (congestive) heart failure: Secondary | ICD-10-CM

## 2013-04-22 DIAGNOSIS — Z6826 Body mass index (BMI) 26.0-26.9, adult: Secondary | ICD-10-CM

## 2013-04-22 DIAGNOSIS — A498 Other bacterial infections of unspecified site: Secondary | ICD-10-CM | POA: Diagnosis present

## 2013-04-22 DIAGNOSIS — F0390 Unspecified dementia without behavioral disturbance: Secondary | ICD-10-CM

## 2013-04-22 DIAGNOSIS — N289 Disorder of kidney and ureter, unspecified: Secondary | ICD-10-CM

## 2013-04-22 DIAGNOSIS — M549 Dorsalgia, unspecified: Secondary | ICD-10-CM

## 2013-04-22 DIAGNOSIS — R532 Functional quadriplegia: Secondary | ICD-10-CM | POA: Diagnosis present

## 2013-04-22 DIAGNOSIS — R451 Restlessness and agitation: Secondary | ICD-10-CM

## 2013-04-22 DIAGNOSIS — E669 Obesity, unspecified: Secondary | ICD-10-CM

## 2013-04-22 DIAGNOSIS — R55 Syncope and collapse: Secondary | ICD-10-CM

## 2013-04-22 DIAGNOSIS — F03918 Unspecified dementia, unspecified severity, with other behavioral disturbance: Secondary | ICD-10-CM | POA: Diagnosis present

## 2013-04-22 DIAGNOSIS — Z9861 Coronary angioplasty status: Secondary | ICD-10-CM

## 2013-04-22 DIAGNOSIS — Z951 Presence of aortocoronary bypass graft: Secondary | ICD-10-CM

## 2013-04-22 DIAGNOSIS — R739 Hyperglycemia, unspecified: Secondary | ICD-10-CM

## 2013-04-22 DIAGNOSIS — Z79899 Other long term (current) drug therapy: Secondary | ICD-10-CM

## 2013-04-22 DIAGNOSIS — Z823 Family history of stroke: Secondary | ICD-10-CM

## 2013-04-22 DIAGNOSIS — I10 Essential (primary) hypertension: Secondary | ICD-10-CM | POA: Diagnosis present

## 2013-04-22 DIAGNOSIS — Z794 Long term (current) use of insulin: Secondary | ICD-10-CM

## 2013-04-22 DIAGNOSIS — R627 Adult failure to thrive: Secondary | ICD-10-CM | POA: Diagnosis present

## 2013-04-22 DIAGNOSIS — Z515 Encounter for palliative care: Secondary | ICD-10-CM

## 2013-04-22 DIAGNOSIS — J209 Acute bronchitis, unspecified: Secondary | ICD-10-CM

## 2013-04-22 DIAGNOSIS — E785 Hyperlipidemia, unspecified: Secondary | ICD-10-CM

## 2013-04-22 DIAGNOSIS — Z880 Allergy status to penicillin: Secondary | ICD-10-CM

## 2013-04-22 DIAGNOSIS — E119 Type 2 diabetes mellitus without complications: Secondary | ICD-10-CM | POA: Diagnosis present

## 2013-04-22 DIAGNOSIS — R296 Repeated falls: Secondary | ICD-10-CM

## 2013-04-22 DIAGNOSIS — J45909 Unspecified asthma, uncomplicated: Secondary | ICD-10-CM

## 2013-04-22 DIAGNOSIS — E41 Nutritional marasmus: Secondary | ICD-10-CM | POA: Diagnosis present

## 2013-04-22 DIAGNOSIS — N183 Chronic kidney disease, stage 3 unspecified: Secondary | ICD-10-CM

## 2013-04-22 DIAGNOSIS — S301XXA Contusion of abdominal wall, initial encounter: Secondary | ICD-10-CM

## 2013-04-22 DIAGNOSIS — I252 Old myocardial infarction: Secondary | ICD-10-CM

## 2013-04-22 DIAGNOSIS — F0391 Unspecified dementia with behavioral disturbance: Secondary | ICD-10-CM | POA: Diagnosis present

## 2013-04-22 DIAGNOSIS — R0600 Dyspnea, unspecified: Secondary | ICD-10-CM

## 2013-04-22 DIAGNOSIS — N390 Urinary tract infection, site not specified: Secondary | ICD-10-CM | POA: Diagnosis present

## 2013-04-22 DIAGNOSIS — J40 Bronchitis, not specified as acute or chronic: Secondary | ICD-10-CM

## 2013-04-22 DIAGNOSIS — Z8673 Personal history of transient ischemic attack (TIA), and cerebral infarction without residual deficits: Secondary | ICD-10-CM

## 2013-04-22 DIAGNOSIS — K219 Gastro-esophageal reflux disease without esophagitis: Secondary | ICD-10-CM | POA: Diagnosis present

## 2013-04-22 DIAGNOSIS — Z66 Do not resuscitate: Secondary | ICD-10-CM | POA: Diagnosis present

## 2013-04-22 DIAGNOSIS — E7800 Pure hypercholesterolemia, unspecified: Secondary | ICD-10-CM | POA: Diagnosis present

## 2013-04-22 DIAGNOSIS — D62 Acute posthemorrhagic anemia: Secondary | ICD-10-CM

## 2013-04-22 DIAGNOSIS — E870 Hyperosmolality and hypernatremia: Secondary | ICD-10-CM | POA: Diagnosis present

## 2013-04-22 DIAGNOSIS — G8929 Other chronic pain: Secondary | ICD-10-CM

## 2013-04-22 DIAGNOSIS — E860 Dehydration: Secondary | ICD-10-CM

## 2013-04-22 LAB — CBC WITH DIFFERENTIAL/PLATELET
BASOS ABS: 0.1 10*3/uL (ref 0.0–0.1)
Basophils Relative: 1 % (ref 0–1)
EOS PCT: 2 % (ref 0–5)
Eosinophils Absolute: 0.3 10*3/uL (ref 0.0–0.7)
HCT: 42.3 % (ref 39.0–52.0)
Hemoglobin: 12.7 g/dL — ABNORMAL LOW (ref 13.0–17.0)
LYMPHS PCT: 21 % (ref 12–46)
Lymphs Abs: 2.7 10*3/uL (ref 0.7–4.0)
MCH: 30 pg (ref 26.0–34.0)
MCHC: 30 g/dL (ref 30.0–36.0)
MCV: 100 fL (ref 78.0–100.0)
Monocytes Absolute: 1.3 10*3/uL — ABNORMAL HIGH (ref 0.1–1.0)
Monocytes Relative: 10 % (ref 3–12)
NEUTROS ABS: 8.9 10*3/uL — AB (ref 1.7–7.7)
NEUTROS PCT: 67 % (ref 43–77)
PLATELETS: 140 10*3/uL — AB (ref 150–400)
RBC: 4.23 MIL/uL (ref 4.22–5.81)
RDW: 16.1 % — AB (ref 11.5–15.5)
WBC: 13.3 10*3/uL — AB (ref 4.0–10.5)

## 2013-04-22 LAB — URINALYSIS, ROUTINE W REFLEX MICROSCOPIC
Glucose, UA: NEGATIVE mg/dL
KETONES UR: NEGATIVE mg/dL
Nitrite: NEGATIVE
Protein, ur: 100 mg/dL — AB
SPECIFIC GRAVITY, URINE: 1.024 (ref 1.005–1.030)
Urobilinogen, UA: 0.2 mg/dL (ref 0.0–1.0)
pH: 5 (ref 5.0–8.0)

## 2013-04-22 LAB — BASIC METABOLIC PANEL
BUN: 99 mg/dL — ABNORMAL HIGH (ref 6–23)
BUN: 88 mg/dL — ABNORMAL HIGH (ref 6–23)
BUN: 87 mg/dL — ABNORMAL HIGH (ref 6–23)
CALCIUM: 9.7 mg/dL (ref 8.4–10.5)
CO2: 25 mEq/L (ref 19–32)
CO2: 23 mEq/L (ref 19–32)
CO2: 23 mEq/L (ref 19–32)
CREATININE: 3.99 mg/dL — AB (ref 0.50–1.35)
Calcium: 8.5 mg/dL (ref 8.4–10.5)
Calcium: 8.2 mg/dL — ABNORMAL LOW (ref 8.4–10.5)
Chloride: 127 mEq/L — ABNORMAL HIGH (ref 96–112)
Chloride: >130 mEq/L (ref 96–112)
Creatinine, Ser: 4.65 mg/dL — ABNORMAL HIGH (ref 0.50–1.35)
Creatinine, Ser: 3.9 mg/dL — ABNORMAL HIGH (ref 0.50–1.35)
GFR calc non Af Amer: 10 mL/min — ABNORMAL LOW (ref >90)
GFR calc non Af Amer: 13 mL/min — ABNORMAL LOW (ref >90)
GFR, EST AFRICAN AMERICAN: 12 mL/min — AB (ref >90)
GFR, EST AFRICAN AMERICAN: 14 mL/min — AB (ref >90)
GFR, EST AFRICAN AMERICAN: 15 mL/min — AB (ref >90)
GFR, EST NON AFRICAN AMERICAN: 12 mL/min — AB (ref >90)
Glucose, Bld: 231 mg/dL — ABNORMAL HIGH (ref 70–99)
Glucose, Bld: 204 mg/dL — ABNORMAL HIGH (ref 70–99)
Glucose, Bld: 201 mg/dL — ABNORMAL HIGH (ref 70–99)
Potassium: 4.7 mEq/L (ref 3.7–5.3)
Potassium: 4.8 mEq/L (ref 3.7–5.3)
Potassium: 4.4 mEq/L (ref 3.7–5.3)
SODIUM: 167 meq/L — AB (ref 137–147)
Sodium: 169 mEq/L (ref 137–147)
Sodium: 169 mEq/L (ref 137–147)

## 2013-04-22 LAB — URINE MICROSCOPIC-ADD ON

## 2013-04-22 LAB — CARBAMAZEPINE LEVEL, TOTAL: CARBAMAZEPINE LVL: 1.4 ug/mL — AB (ref 4.0–12.0)

## 2013-04-22 LAB — GLUCOSE, CAPILLARY
Glucose-Capillary: 219 mg/dL — ABNORMAL HIGH (ref 70–99)
Glucose-Capillary: 76 mg/dL (ref 70–99)

## 2013-04-22 MED ORDER — CIPROFLOXACIN IN D5W 400 MG/200ML IV SOLN
400.0000 mg | Freq: Once | INTRAVENOUS | Status: AC
  Administered 2013-04-22: 400 mg via INTRAVENOUS
  Filled 2013-04-22: qty 200

## 2013-04-22 MED ORDER — ONDANSETRON HCL 4 MG PO TABS: 4.0000 mg | ORAL_TABLET | Freq: Four times a day (QID) | ORAL | Status: DC | PRN

## 2013-04-22 MED ORDER — FINASTERIDE 5 MG PO TABS
5.0000 mg | ORAL_TABLET | Freq: Every day | ORAL | Status: DC
  Filled 2013-04-22 (×2): qty 1

## 2013-04-22 MED ORDER — ACETAMINOPHEN 650 MG RE SUPP
650.0000 mg | Freq: Four times a day (QID) | RECTAL | Status: DC | PRN
  Filled 2013-04-22: qty 1

## 2013-04-22 MED ORDER — ONDANSETRON HCL 4 MG/2ML IJ SOLN: 4.0000 mg | Freq: Four times a day (QID) | INTRAMUSCULAR | Status: DC | PRN

## 2013-04-22 MED ORDER — SODIUM CHLORIDE 0.9 % IV BOLUS (SEPSIS)
1000.0000 mL | Freq: Once | INTRAVENOUS | Status: AC
  Administered 2013-04-22: 1000 mL via INTRAVENOUS

## 2013-04-22 MED ORDER — INSULIN ASPART 100 UNIT/ML ~~LOC~~ SOLN
0.0000 [IU] | Freq: Three times a day (TID) | SUBCUTANEOUS | Status: DC
  Administered 2013-04-22: 19:00:00 via SUBCUTANEOUS
  Administered 2013-04-23: 1 [IU] via SUBCUTANEOUS
  Administered 2013-04-23: 5 [IU] via SUBCUTANEOUS
  Administered 2013-04-23 – 2013-04-24 (×2): 2 [IU] via SUBCUTANEOUS
  Administered 2013-04-24: 3 [IU] via SUBCUTANEOUS
  Administered 2013-04-24: 2 [IU] via SUBCUTANEOUS
  Administered 2013-04-25 (×2): 3 [IU] via SUBCUTANEOUS

## 2013-04-22 MED ORDER — DEXTROSE 5 % IV SOLN
INTRAVENOUS | Status: DC
  Administered 2013-04-22: 1000 mL via INTRAVENOUS
  Administered 2013-04-23 – 2013-04-25 (×5): via INTRAVENOUS

## 2013-04-22 MED ORDER — CIPROFLOXACIN IN D5W 400 MG/200ML IV SOLN
400.0000 mg | INTRAVENOUS | Status: DC
  Administered 2013-04-22 – 2013-04-24 (×3): 400 mg via INTRAVENOUS
  Filled 2013-04-22 (×4): qty 200

## 2013-04-22 MED ORDER — HALOPERIDOL LACTATE 5 MG/ML IJ SOLN
2.0000 mg | Freq: Four times a day (QID) | INTRAMUSCULAR | Status: DC | PRN
  Administered 2013-04-22 – 2013-04-25 (×8): 2 mg via INTRAVENOUS
  Filled 2013-04-22 (×8): qty 1

## 2013-04-22 MED ORDER — ACETAMINOPHEN 325 MG PO TABS
650.0000 mg | ORAL_TABLET | Freq: Four times a day (QID) | ORAL | Status: DC | PRN
  Administered 2013-04-22: 650 mg via ORAL

## 2013-04-22 MED ORDER — ONDANSETRON HCL 4 MG/2ML IJ SOLN
4.0000 mg | Freq: Once | INTRAMUSCULAR | Status: DC
  Filled 2013-04-22: qty 2

## 2013-04-22 MED ORDER — ONDANSETRON HCL 4 MG/2ML IJ SOLN
4.0000 mg | Freq: Once | INTRAMUSCULAR | Status: AC
  Administered 2013-04-22: 4 mg via INTRAVENOUS

## 2013-04-22 MED ORDER — ALBUTEROL SULFATE (2.5 MG/3ML) 0.083% IN NEBU: 2.5000 mg | INHALATION_SOLUTION | RESPIRATORY_TRACT | Status: DC | PRN

## 2013-04-22 MED ORDER — SODIUM CHLORIDE 0.9 % IV SOLN: Freq: Once | INTRAVENOUS | Status: DC

## 2013-04-22 MED ORDER — SODIUM CHLORIDE 0.9 % IV SOLN
Freq: Once | INTRAVENOUS | Status: AC
  Administered 2013-04-22: 12:00:00 via INTRAVENOUS
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
## 2013-04-22 NOTE — ED Notes (Signed)
Dr Adriana Simasook notified of pt critical labs

## 2013-04-22 NOTE — Plan of Care (Signed)
Problem: Consults Goal: Nutrition Consult-if indicated Outcome: Progressing Pt's NPO

## 2013-04-22 NOTE — ED Notes (Signed)
Pt moaning, pulling at clothes, grabbing staff within reach. Pt unable to answer questions d/t advance dementia. Pt in NAD

## 2013-04-22 NOTE — Progress Notes (Signed)
It is noted pt is  allergic to Novolog. Dr. Rito EhrlichKrishnan called, he stated to call pharmacy to see what he got when he was inpatient Dec. 2014. Pharmacy called and stated he has been getting novolog the last several admissions. He was covered with 3 units Novolog for CBG-219

## 2013-04-22 NOTE — ED Notes (Signed)
Nikki, RN from [facility] called to obtain pt status to inform pt wife and was informed that pt will be admitted

## 2013-04-22 NOTE — ED Notes (Signed)
Per EMS- Patient is a resident [facility]. Patient was being treated for a UTI yesterday. Patient kept pulling his IV out and therefore were not able to give IV antibiotics. Today, patient is confused more than usual. Resident MD wanted patient to be seen for renal failure due to dehydration. Staff reported that the patient usually recognizes people appropriately an danswers most questions appropriately .

## 2013-04-22 NOTE — Progress Notes (Signed)
Pt extremely restless, pulling his gown off, trying to climb over the railing. IV wrapped in kerlex. Bed alarm on. Pt unable to communicate with words- he makes chattering noises. Disoriented x4. Family at bedside. Wife is POA. Pt is from [facility] and his wife lives at home. Family states he had a changed in mental status about 3 weeks ago, he also stopped taking PO the last several days. Pt is NPO.
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
## 2013-04-22 NOTE — ED Notes (Signed)
[facility] Expected date: 04/22/13 Expected time: 9:02 AM Means of arrival: Ambulance Comments: Confused, recent UTI

## 2013-04-23 ENCOUNTER — Encounter (HOSPITAL_COMMUNITY): Payer: Self-pay

## 2013-04-23 DIAGNOSIS — E119 Type 2 diabetes mellitus without complications: Secondary | ICD-10-CM

## 2013-04-23 DIAGNOSIS — N390 Urinary tract infection, site not specified: Secondary | ICD-10-CM

## 2013-04-23 LAB — CBC
HCT: 35.5 % — ABNORMAL LOW (ref 39.0–52.0)
Hemoglobin: 10.7 g/dL — ABNORMAL LOW (ref 13.0–17.0)
MCH: 30.3 pg (ref 26.0–34.0)
MCHC: 30.1 g/dL (ref 30.0–36.0)
MCV: 100.6 fL — ABNORMAL HIGH (ref 78.0–100.0)
PLATELETS: 115 10*3/uL — AB (ref 150–400)
RBC: 3.53 MIL/uL — AB (ref 4.22–5.81)
RDW: 16.1 % — AB (ref 11.5–15.5)
WBC: 11.9 10*3/uL — ABNORMAL HIGH (ref 4.0–10.5)

## 2013-04-23 LAB — COMPREHENSIVE METABOLIC PANEL
ALK PHOS: 69 U/L (ref 39–117)
ALT: 17 U/L (ref 0–53)
AST: 49 U/L — AB (ref 0–37)
Albumin: 2.7 g/dL — ABNORMAL LOW (ref 3.5–5.2)
BILIRUBIN TOTAL: 0.3 mg/dL (ref 0.3–1.2)
BUN: 81 mg/dL — ABNORMAL HIGH (ref 6–23)
CHLORIDE: 130 meq/L — AB (ref 96–112)
CO2: 24 mEq/L (ref 19–32)
Calcium: 8 mg/dL — ABNORMAL LOW (ref 8.4–10.5)
Creatinine, Ser: 3.45 mg/dL — ABNORMAL HIGH (ref 0.50–1.35)
GFR calc Af Amer: 17 mL/min — ABNORMAL LOW (ref >90)
GFR calc non Af Amer: 15 mL/min — ABNORMAL LOW (ref >90)
Glucose, Bld: 207 mg/dL — ABNORMAL HIGH (ref 70–99)
Potassium: 4.8 mEq/L (ref 3.7–5.3)
SODIUM: 165 meq/L — AB (ref 137–147)
TOTAL PROTEIN: 6.9 g/dL (ref 6.0–8.3)

## 2013-04-23 LAB — BASIC METABOLIC PANEL
BUN: 80 mg/dL — ABNORMAL HIGH (ref 6–23)
BUN: 66 mg/dL — ABNORMAL HIGH (ref 6–23)
CALCIUM: 8.4 mg/dL (ref 8.4–10.5)
CO2: 24 mEq/L (ref 19–32)
CO2: 23 mEq/L (ref 19–32)
Calcium: 8.3 mg/dL — ABNORMAL LOW (ref 8.4–10.5)
Chloride: >130 mEq/L (ref 96–112)
Chloride: 131 mEq/L (ref 96–112)
Creatinine, Ser: 3.88 mg/dL — ABNORMAL HIGH (ref 0.50–1.35)
Creatinine, Ser: 2.93 mg/dL — ABNORMAL HIGH (ref 0.50–1.35)
GFR calc Af Amer: 21 mL/min — ABNORMAL LOW (ref >90)
GFR calc non Af Amer: 18 mL/min — ABNORMAL LOW (ref >90)
GFR, EST AFRICAN AMERICAN: 15 mL/min — AB (ref >90)
GFR, EST NON AFRICAN AMERICAN: 13 mL/min — AB (ref >90)
GLUCOSE: 143 mg/dL — AB (ref 70–99)
Glucose, Bld: 96 mg/dL (ref 70–99)
POTASSIUM: 4.2 meq/L (ref 3.7–5.3)
POTASSIUM: 4.6 meq/L (ref 3.7–5.3)
Sodium: 169 mEq/L (ref 137–147)
Sodium: 165 mEq/L (ref 137–147)

## 2013-04-23 LAB — URINE CULTURE
Colony Count: NO GROWTH
Culture: NO GROWTH

## 2013-04-23 LAB — GLUCOSE, CAPILLARY
GLUCOSE-CAPILLARY: 110 mg/dL — AB (ref 70–99)
GLUCOSE-CAPILLARY: 133 mg/dL — AB (ref 70–99)
GLUCOSE-CAPILLARY: 98 mg/dL (ref 70–99)
Glucose-Capillary: 192 mg/dL — ABNORMAL HIGH (ref 70–99)
Glucose-Capillary: 253 mg/dL — ABNORMAL HIGH (ref 70–99)
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
## 2013-04-23 NOTE — Progress Notes (Signed)
Dr. Rito EhrlichKrishnan texted with Gerhard MunchHenry Hollar's critical lab: Na-165, Chloride_130.5

## 2013-04-23 NOTE — Plan of Care (Signed)
Problem: Phase I Progression Outcomes Goal: Voiding-avoid urinary catheter unless indicated Outcome: Progressing Condom cath     

## 2013-04-23 NOTE — Progress Notes (Signed)
Clinical Social Work Department BRIEF PSYCHOSOCIAL ASSESSMENT 04/23/2013  Patient:  Albert Nelson,Albert Nelson     Account Number:  [id number]     Admit date:  04/22/2013  Clinical Social Worker:  Dennison BullaGERBER,Sanyia Dini, LCSW  Date/Time:  04/23/2013 10:00 AM  Referred by:  Physician  Date Referred:  04/23/2013 Referred for  SNF Placement   Other Referral:   Interview type:  Family Other interview type:    PSYCHOSOCIAL DATA Living Status:  FACILITY [facility] Level of care:  Skilled Nursing Facility Primary support name:  Albert Nelson Primary support relationship to patient:  SPOUSE Degree of support available:   Strong    CURRENT CONCERNS Current Concerns  Post-Acute Placement   Other Concerns:    SOCIAL WORK ASSESSMENT / PLAN CSW received referral due to patient being admitted from a facility. CSW reviewed chart and attempted to meet with patient but patient unable to participate in assessment. CSW called and spoke with wife via phone.    [facility]. Wife reports that patient has received good care and she wants patient to return at [facility]. CSW explained CSW role and provided wife with CSW contact information if needed.    CSW completed FL2 and spoke with SNF. SNF reports that they will discuss wife completing a bed hold to ensure patient can return. CSW faxed clinicals for SNF to review. CSW will continue to follow.   Assessment/plan status:  Psychosocial Support/Ongoing Assessment of Needs Other assessment/ plan:   Information/referral to community resources:   Will return to SNF    PATIENT'S/FAMILY'S RESPONSE TO PLAN OF CARE: Patient unable to participate in assessment. Wife thanked CSW for call and reports it is difficult to be away from husband but knows she is doing the best for him. Wife reports that she still lives at home but is looking forward to visiting with patient. Wife agreeable to plan for  patient to return to SNF.       [place] VillageHolly Helmer [place], [place]CS[place] [phone]

## 2013-04-23 NOTE — Progress Notes (Signed)
PT Cancellation Note  Patient Details Name: Albert Nelson MRN: [id number] DOB: 12/06/1923   Cancelled Treatment:    Reason Eval/Treat Not Completed: Medical issues which prohibited therapy (pt combative, hold PT today per MD. Will follow.)   Tamala SerUhlenberg, Nivea Wojdyla Kistler 04/23/2013, 10:31 AM [phone]

## 2013-04-23 NOTE — Plan of Care (Signed)
Problem: Phase I Progression Outcomes Goal: Tolerating diet Outcome: Progressing NPO

## 2013-04-23 NOTE — Progress Notes (Signed)
This shift  Pt continues to be restless and combative. Removed IV that was wrapped in kirlex and condom cath, pulling at IV pole and overhead wires, attempting to  climb over rails disoriented x4. Safety mitts placed on pt, [facility]. Will continue to monitor pt thru shift

## 2013-04-23 NOTE — Progress Notes (Signed)
Pt continues to be restless, removing his gown and bedding. Hand mittens applied. He also continues to be repetitive with sounds, occasionally he repeats a word that I say. I told him to look out the window because it's snowing and he did.

## 2013-04-23 NOTE — Progress Notes (Signed)
This shift lab called  With critical lab values for NA+ 167 and chloride > 130 Attending, Claiborne Billingsallahan, notified w/ no new orders at this time.
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
## 2013-04-23 NOTE — Progress Notes (Signed)
OT Cancellation Note  Patient Details Name: Candace GallusHenry C Hochstein MRN: [id number] DOB: 02/20/1924   Cancelled Treatment:    Reason Eval/Treat Not Completed: Other (comment) Per MD and chart, pt combative and MD requests to hold on therapy today. Will try back next date.  Lennox LaityStone, Winifred Bodiford Stafford [phone] 04/23/2013, 10:09 AM

## 2013-04-24 LAB — CBC
HCT: 33.3 % — ABNORMAL LOW (ref 39.0–52.0)
Hemoglobin: 10.2 g/dL — ABNORMAL LOW (ref 13.0–17.0)
MCH: 30.2 pg (ref 26.0–34.0)
MCHC: 30.6 g/dL (ref 30.0–36.0)
MCV: 98.5 fL (ref 78.0–100.0)
PLATELETS: 104 10*3/uL — AB (ref 150–400)
RBC: 3.38 MIL/uL — AB (ref 4.22–5.81)
RDW: 15.7 % — AB (ref 11.5–15.5)
WBC: 10.5 10*3/uL (ref 4.0–10.5)

## 2013-04-24 LAB — BASIC METABOLIC PANEL
BUN: 59 mg/dL — ABNORMAL HIGH (ref 6–23)
BUN: 51 mg/dL — AB (ref 6–23)
CALCIUM: 8.3 mg/dL — AB (ref 8.4–10.5)
CO2: 25 meq/L (ref 19–32)
CO2: 23 mEq/L (ref 19–32)
CREATININE: 2.52 mg/dL — AB (ref 0.50–1.35)
Calcium: 8.1 mg/dL — ABNORMAL LOW (ref 8.4–10.5)
Chloride: 127 mEq/L — ABNORMAL HIGH (ref 96–112)
Chloride: 125 mEq/L — ABNORMAL HIGH (ref 96–112)
Creatinine, Ser: 2.05 mg/dL — ABNORMAL HIGH (ref 0.50–1.35)
GFR calc Af Amer: 25 mL/min — ABNORMAL LOW (ref >90)
GFR calc Af Amer: 32 mL/min — ABNORMAL LOW (ref >90)
GFR calc non Af Amer: 27 mL/min — ABNORMAL LOW (ref >90)
GFR, EST NON AFRICAN AMERICAN: 21 mL/min — AB (ref >90)
GLUCOSE: 230 mg/dL — AB (ref 70–99)
Glucose, Bld: 234 mg/dL — ABNORMAL HIGH (ref 70–99)
Potassium: 4.7 mEq/L (ref 3.7–5.3)
Potassium: 4.1 mEq/L (ref 3.7–5.3)
SODIUM: 160 meq/L — AB (ref 137–147)
Sodium: 157 mEq/L — ABNORMAL HIGH (ref 137–147)

## 2013-04-24 LAB — GLUCOSE, CAPILLARY
GLUCOSE-CAPILLARY: 204 mg/dL — AB (ref 70–99)
Glucose-Capillary: 192 mg/dL — ABNORMAL HIGH (ref 70–99)
Glucose-Capillary: 197 mg/dL — ABNORMAL HIGH (ref 70–99)
Glucose-Capillary: 165 mg/dL — ABNORMAL HIGH (ref 70–99)

## 2013-04-24 NOTE — Progress Notes (Signed)
PT Cancellation Note  Patient Details Name: Albert GallusHenry C Nelson MRN: [id number] DOB: 09/17/1923   Cancelled Treatment:    Reason Eval/Treat Not Completed: Medical issues which prohibited therapy (critical labs, )   Rada HayHill, Angalina Ante Elizabeth 04/24/2013, 7:18 AM Blanchard KelchKaren Maddelyn Rocca PT [phone]

## 2013-04-24 NOTE — Progress Notes (Signed)
OT Cancellation Note  Patient Details Name: Albert GallusHenry C Nelson MRN: [id number] DOB: 06/01/1923   Cancelled Treatment:    Reason Eval/Treat Not Completed: Other (comment)  Pt with critical Na+ and could not maintain arousal with SLP.  Did not have time to check back again this pm.  Will check on pt tomorrow.    Medea Deines 04/24/2013, 4:16 PM Marica OtterMaryellen Kaydin Labo, OTR/L [phone] 04/24/2013

## 2013-04-24 NOTE — Progress Notes (Signed)
SLP Cancellation Note  Patient Details Name: Candace GallusHenry C Madruga MRN: [id number] DOB: 07/23/1923   Cancelled treatment:        Spoke with RN re: pt status.  Pt given haldol this morning for agitation, and is currently insufficiently arousable for assessment of po readiness.  Will continue efforts.  Hershall Benkert B. Murvin NatalBueche, [facility] SaginawMSP, CCC-SLP [phone]  Leigh AuroraBueche, Ayriana Wix Brown 04/24/2013, 11:25 AM

## 2013-04-24 NOTE — Progress Notes (Signed)
Inpatient Diabetes Program Recommendations  AACE/ADA: New Consensus Statement on Inpatient Glycemic Control (2013)  Target Ranges:  Prepandial:   less than 140 mg/dL      Peak postprandial:   less than 180 mg/dL (1-2 hours)      Critically ill patients:  140 - 180 mg/dL   Reason for Visit: Hyperglycemia Diabetes history: Type 2 Outpatient Diabetes medications: Levemir 4 units QHS, Amaryl 3 mg QAM and Novolog s/s Current orders for Inpatient glycemic control: Novolog sensitive tidwc  Results for Candace GallusHOMAS, Asher C (MRN [id number]) as of 04/24/2013 12:22  Ref. Range 04/23/2013 07:48 04/23/2013 11:12 04/23/2013 16:47 04/23/2013 22:12 04/24/2013 08:07  Glucose-Capillary Latest Range: 70-99 mg/dL 409192 (H) 811253 (H) 914133 (H) 110 (H) 204 (H)    Inpatient Diabetes Program Recommendations Insulin - Basal: Add Levemir 4 units QHS Correction (SSI): Change Novolog sensitive to Q4H while NPO Diet: When diet advanced, CHO mod med  Note: Will follow for glycemic control. Thank you. Ailene Ardshonda Ernesto Zukowski, RD, LDN, CDE Inpatient Diabetes Coordinator [phone]
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
## 2013-04-25 DIAGNOSIS — R0609 Other forms of dyspnea: Secondary | ICD-10-CM

## 2013-04-25 DIAGNOSIS — E860 Dehydration: Secondary | ICD-10-CM

## 2013-04-25 DIAGNOSIS — M549 Dorsalgia, unspecified: Secondary | ICD-10-CM

## 2013-04-25 DIAGNOSIS — R0600 Dyspnea, unspecified: Secondary | ICD-10-CM

## 2013-04-25 DIAGNOSIS — R451 Restlessness and agitation: Secondary | ICD-10-CM

## 2013-04-25 DIAGNOSIS — R0989 Other specified symptoms and signs involving the circulatory and respiratory systems: Secondary | ICD-10-CM

## 2013-04-25 DIAGNOSIS — Z515 Encounter for palliative care: Secondary | ICD-10-CM

## 2013-04-25 DIAGNOSIS — D62 Acute posthemorrhagic anemia: Secondary | ICD-10-CM

## 2013-04-25 LAB — GLUCOSE, CAPILLARY
GLUCOSE-CAPILLARY: 230 mg/dL — AB (ref 70–99)
Glucose-Capillary: 235 mg/dL — ABNORMAL HIGH (ref 70–99)

## 2013-04-25 LAB — BASIC METABOLIC PANEL
BUN: 42 mg/dL — AB (ref 6–23)
CHLORIDE: 120 meq/L — AB (ref 96–112)
CO2: 22 meq/L (ref 19–32)
CREATININE: 1.76 mg/dL — AB (ref 0.50–1.35)
Calcium: 8.1 mg/dL — ABNORMAL LOW (ref 8.4–10.5)
GFR calc non Af Amer: 33 mL/min — ABNORMAL LOW (ref >90)
GFR, EST AFRICAN AMERICAN: 38 mL/min — AB (ref >90)
Glucose, Bld: 259 mg/dL — ABNORMAL HIGH (ref 70–99)
POTASSIUM: 4.2 meq/L (ref 3.7–5.3)
Sodium: 154 mEq/L — ABNORMAL HIGH (ref 137–147)

## 2013-04-25 MED ORDER — HALOPERIDOL LACTATE 5 MG/ML IJ SOLN: 2.0000 mg | Freq: Four times a day (QID) | INTRAMUSCULAR | Status: DC | PRN

## 2013-04-25 MED ORDER — MORPHINE SULFATE 2 MG/ML IJ SOLN: 2.0000 mg | INTRAMUSCULAR | Status: DC | PRN

## 2013-04-25 MED ORDER — SODIUM CHLORIDE 0.9 % IV SOLN
INTRAVENOUS | Status: AC
  Administered 2013-04-25: 13:00:00 via INTRAVENOUS

## 2013-04-25 NOTE — Evaluation (Signed)
SLP Cancellation Note  Patient Details Name: Albert Nelson MRN: [id number] DOB: 05/14/1923   Cancelled treatment:       Reason Eval/Treat Not Completed: Fatigue/lethargy limiting ability to participate;Patient's level of consciousness   Please reorder when/if pt becomes appropriate.     Mickie BailKimball, [name] Ann Abrea Henle WaldoKimball, [place]MS [facility] LebanonCCC SLP [phone]
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
## 2013-04-25 NOTE — Progress Notes (Signed)
Nutrition Consult Note  Full nutrition assessment completed 2/16. Received consult for assessment of nutritional status, however per discussion with palliative care team, pt is now comfort care. Nutrition signing off.  Levon HedgerHeather Baron MS, RD, LDN [phone] Pager [phone] After Hours Pager

## 2013-04-25 NOTE — Progress Notes (Signed)
Clinical Social Work  Patient was discussed during progression meeting and MD reports she will order PMT to complete GOC meeting for patient. CSW updated [facility] re: DC plans and agreeable to keep them updated once GOC completed.   CSW will continue to follow.  McHenryHolly Addaleigh Nelson, [place] [phone]

## 2013-04-25 NOTE — Progress Notes (Signed)
PT NOTE  Spoke with palliative care-pt is now comfort care. PT order has been discontinued. Will sign off. Rebeca AlertJannie Emran Molzahn, MPT [phone]
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
## 2013-04-25 NOTE — Progress Notes (Signed)
OT Cancellation Note/Sign off  Patient Details Name: Albert GallusHenry C Duby MRN: [id number] DOB: 03/11/1923   Cancelled Treatment:    Reason Eval/Treat Not Completed: Other (comment) Spoke to RN.  Pt sleeping and becomes agitated when awake. Will sign off at this time.  If pt would benefit from OT at a later time, please reorder. Thank you.  Giovanni Bath 04/25/2013, 9:29 AM Marica OtterMaryellen Lanessa Shill, OTR/L [phone] 04/25/2013

## 2013-04-25 NOTE — Progress Notes (Signed)
Clinical Social Work  CSW received referral in order to offer hospice choice. CSW spoke with PMT NP prior to entering room who updated CSW on [place] meeting. CSW met with patient and wife at bedside but patient is unable to participate in assessment. Wife confirms that she is interested in [facility] because she wants patient to be close to home. CSW explained hospice process and inquired about alternative plan in case [facility] is unavailable. CSW has been in contact with SNF who is agreeable to accept patient back with hospice services if family desires. Wife's preference is for patient to go to [facility] and if that is not an option to return back to [place] so that she can continue to visit on a regular basis.   CSW called [employer] [facility] [place]) and provided referral. CSW provided her with wife's contact information as well. (H) [phone] or (C) [phone].  CSW will continue to follow.  [place], [place] [phone]
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
## 2013-04-25 NOTE — Progress Notes (Signed)
Patient ID: Albert Nelson, male   DOB: 03/16/1923, 80 y.o.   MRN: [id number]  [facility] PROGRESS NOTE  Albert Nelson YQM:[id number] DOB: 01/31/1924 DOA: 04/22/2013 PCP: Albert Nelson  Brief narrative: 80 y.o. male with a past medical history of dementia, who was admitted back in December for acute bronchitis. However, he presented back the same day after a fall at home, which resulted in a rectus sheath hematoma. He was managed conservatively. At that time he was discharged to a skilled nursing facility. Patient has had poor oral intake for past many days prior to admission. He was sent over to the ED due to dehydration. He was found to be in acute renal failure and had hypernatremia. He was also encephalopathic with underlying dementia.   Principal Problem:   ARF (acute renal failure) - pt currently on IVF and responding well, Cr is trending down and pt with good urine output - minimal oral intake - SLP evaluation requested, nutrition consultation requested  - change IVF to NS at 75 cc/hr - repeat BMP in AM and continue to monitor urine output  Active Problems:   ? UTI - with leukocytosis - pt was started on Cipro 02/15, today is day #4/5 - WBC now WNL and urine culture with no growth to date    DM (diabetes mellitus) - reasonable inpatient control - avoid insulin at this time until PO intake improves    FTT - secondary to progressive deconditioning and poor oral intake, advancing dementia  - SLP and nutrition consult requested    CAD (coronary artery disease) - clinically compensated at this time    Dementia - progressive, will likely go to SNF once medically ready - PCT consultation requested for GOC discussions    Hypernatremia - secondary to pre renal etiology in the setting of poor oral intake - Na trending down  - will change IVF to NS at 75 cc/hr    Severe malnutrition - secondary to advancing dementia - nutrition consult and SLP as noted above  Functional quadriplegia - secondary to complex medical conditions outlined above   Anemia of chronic disease - no signs of active bleeding  - repeat CBC in AM  Consultants:  PCT Procedures/Studies:  None Antibiotics:  None  Code Status: DNR Family Communication: No family at bedside  Disposition Plan: SNF when medically stable  HPI/Subjective: No events overnight.   Objective: Filed Vitals:   04/24/13 1557 04/24/13 2154 04/25/13 0034 04/25/13 0625  BP: 109/60 167/52  127/64  Pulse: 80 86  85  Temp: 98.1 F (36.7 C) 97.7 F (36.5 C)  97.9 F (36.6 C)  TempSrc: Oral Oral  Axillary  Resp: 20   16  Height:      Weight:   78.1 kg (172 lb 2.9 oz)   SpO2: 99% 96%  96%    Intake/Output Summary (Last 24 hours) at 04/25/13 1116 Last data filed at 04/25/13 [id number]  Gross per 24 hour  Intake   1470 ml  Output    975 ml  Net    495 ml    Exam:   General:  Pt is alert, does not follow commands appropriately, not in acute distress  Cardiovascular: Regular rate and rhythm, S1/S2, no murmurs, no rubs, no gallops  Respiratory: Clear to auscultation bilaterally, no wheezing, diminished breath sounds at bases   Abdomen: Soft, non tender, non distended, bowel sounds present, no guarding  Extremities: No edema, pulses DP and PT palpable bilaterally  Data  Reviewed: Basic Metabolic Panel:  Recent Labs Lab 04/23/13 0500 04/23/13 1610 04/24/13 0530 04/24/13 1550 04/25/13 0455  NA 165* 165* 160* 157* 154*  K 4.8 4.2 4.7 4.1 4.2  CL 130* 131* 127* 125* 120*  CO2 24 23 25 23 22   GLUCOSE 207* 143* 234* 230* 259*  BUN 81* 66* 59* 51* 42*  CREATININE 3.45* 2.93* 2.52* 2.05* 1.76*  CALCIUM 8.0* 8.3* 8.1* 8.3* 8.1*   Liver Function Tests:  Recent Labs Lab 04/23/13 0500  AST 49*  ALT 17  ALKPHOS 69  BILITOT 0.3  PROT 6.9  ALBUMIN 2.7*   CBC:  Recent Labs Lab 04/22/13 0956 04/23/13 0500 04/24/13 0530  WBC 13.3* 11.9* 10.5  NEUTROABS 8.9*  --   --   HGB  12.7* 10.7* 10.2*  HCT 42.3 35.5* 33.3*  MCV 100.0 100.6* 98.5  PLT 140* 115* 104*   CBG:  Recent Labs Lab 04/24/13 0807 04/24/13 1235 04/24/13 1655 04/24/13 2151 04/25/13 0719  GLUCAP 204* 192* 197* 165* 235*    Recent Results (from the past 240 hour(s))  URINE CULTURE     Status: None   Collection Time    04/22/13 12:19 PM      Result Value Ref Range Status   Specimen Description URINE, CATHETERIZED   Final   Special Requests NONE   Final   Culture  Setup Time     Final   Value: 04/22/2013 19:04     Performed at [facility] Count     Final   Value: NO GROWTH     Performed at [facility]   Culture     Final   Value: NO GROWTH     Performed at [facility]   Report Status 04/23/2013 FINAL   Final     Scheduled Meds: . ciprofloxacin  400 mg Intravenous Q24H  . insulin aspart  0-9 Units Subcutaneous TID WC   Continuous Infusions: . dextrose 100 mL/hr at 04/25/13 1610   Debbora Presto, Nelson  [facility] Pager [phone]  If 7PM-7AM, please contact night-coverage [email or web address] Password [email or web address] 04/25/2013, 11:16 AM   LOS: 3 days

## 2013-04-25 NOTE — Progress Notes (Signed)
SLP noted pt full comfort care currently, will sign off at this time.  Please reorder if indicated/desired.   Albert Burnetamara Emalene Welte, MS [facility] SLP [phone]

## 2013-04-26 DIAGNOSIS — IMO0002 Reserved for concepts with insufficient information to code with codable children: Secondary | ICD-10-CM

## 2013-04-26 MED ORDER — ALBUTEROL SULFATE (2.5 MG/3ML) 0.083% IN NEBU: 2.5000 mg | INHALATION_SOLUTION | RESPIRATORY_TRACT | Status: AC | PRN

## 2013-04-26 MED ORDER — LORAZEPAM 2 MG/ML IJ SOLN: 1.0000 mg | INTRAMUSCULAR | Status: AC | PRN

## 2013-04-26 MED ORDER — MORPHINE SULFATE (CONCENTRATE) 20 MG/ML PO SOLN: 20.0000 mg | ORAL | Status: AC | PRN

## 2013-04-26 MED ORDER — MORPHINE SULFATE (CONCENTRATE) 10 MG /0.5 ML PO SOLN
10.0000 mg | ORAL | Status: DC | PRN
  Administered 2013-04-26 (×2): 10 mg via ORAL
  Filled 2013-04-26 (×2): qty 0.5

## 2013-04-26 NOTE — Progress Notes (Signed)
Clinical Social Work  CSW spoke with [facility] representative who reports no available beds at this time. CSW spoke with wife who reports she would [facility] following. Wife reports she is happy that patient can go back to [facility] because she wants to be able to visit on a daily basis. Wife reports this hospital stay was difficult to make decisions but feels this is the best option for patient. CSW faxed DC summary to SNF and spoke with admissions coordinator who reports SNF will arrange hospice services. CSW prepared DC packet with FL2, DNR, MOST form, and hard scripts included. CSW informed patient, wife, and RN of DC plans and RN requests PTAR be arranged for 2 pm pick. RN to call report to SNF. PTAR Request #: [id number].  CSW is signing off but available if needed.  [place], [place] [phone]

## 2013-04-26 NOTE — Consult Note (Signed)
I have reviewed this case with our NP and agree with the Assessment and Plan as stated.  Laquia Rosano L. Bereket Gernert, MD MBA The Palliative Medicine Team at [facility] Team Phone: [phone] Pager: [phone]   

## 2013-04-26 NOTE — Discharge Summary (Signed)
Physician Discharge Summary  Albert Nelson [id number]:[id number] DOB: 03/21/1923 DOA: 04/22/2013  PCP: Laurena SlimmerLARK,PRESTON S, MD  Admit date: 04/22/2013 Discharge date: 04/26/2013  Recommendations for Outpatient Follow-up:  1. [facility] care 2. Full comfort desired per family and per pt's wishes, please see detailed list of medications on discharge   Discharge Diagnoses: Progressive failure to thrive, ARF, functional quadriplegia  Principal Problem:   ARF (acute renal failure) Active Problems:   DM (diabetes mellitus)   Dehydration   CAD (coronary artery disease)   Dementia   Hypernatremia   Palliative care encounter   Agitation   Dyspnea  Discharge Condition: Stable  Diet recommendation: Comfort feeding as pt able to tolerate   Brief narrative:  80 y.o. male with a past medical history of dementia, who was admitted back in December for acute bronchitis. However, he presented back the same day after a fall at home, which resulted in a rectus sheath hematoma. He was managed conservatively. At that time he was discharged to a skilled nursing facility. Patient has had poor oral intake for past many days prior to admission. He was sent over to the ED due to dehydration. He was found to be in acute renal failure and had hypernatremia. He was also encephalopathic with underlying dementia.   Principal Problem:  ARF (acute renal failure)  - pt started on IVF and Cr noted to be trending down  - minimal oral intake and PCT consulted, comfort care desired to ensure pt's wishes are respected and followed  - no further blood tests to ensure comfort - stop IVF and encourage PO intake and pt able to tolerate  Active Problems:  ? UTI  - with leukocytosis  - pt was started on Cipro 02/15, today is day #5/5  - stop ABX and no need for ABX on discharge  - WBC now WNL and urine culture with no growth to date  DM (diabetes mellitus)  - reasonable inpatient  control  - avoid insulin at this time as pt not eating at this time, very poor oral intake FTT  - secondary to progressive deconditioning and poor oral intake, advancing dementia  - SLP and nutrition consult requested but cancelled to ensure comfort  - comfort feeding  CAD (coronary artery disease)  - clinically compensated at this time  Dementia  - progressive, will go to SNF with hospice care - appreciate PCT input and assistance  Hypernatremia  - secondary to pre renal etiology in the setting of poor oral intake  - Na trending down  - poor oral intake so this will remain persistent problem - stop IVF to ensure comfort  Severe malnutrition  - secondary to advancing dementia  Functional quadriplegia  - secondary to complex medical conditions outlined above  Anemia of chronic disease  - no signs of active bleeding   Consultants:  PCT Procedures/Studies:  None Antibiotics:  None  Code Status: DNR  Family Communication: No family at bedside  Disposition Plan: SNF with hospice care  Discharge Exam: Filed Vitals:   04/26/13 0510  BP: 132/72  Pulse: 98  Temp: 97.7 F (36.5 C)  Resp: 18   Filed Vitals:   04/25/13 0625 04/25/13 1310 04/25/13 2100 04/26/13 0510  BP: 127/64 132/69 105/63 132/72  Pulse: 85 80 102 98  Temp: 97.9 F (36.6 C) 98.7 F (37.1 C) 100.4 F (38 C) 97.7 F (36.5 C)  TempSrc: Axillary Axillary Oral Oral  Resp: 16 17  18 18  Height:      Weight:      SpO2: 96% 97% 97% 98%    General: Pt is alert, confused and trying pull the lines off, in mild distress, not following any commands  Cardiovascular: Regular rate and rhythm, S1/S2 +, no murmurs, no rubs, no gallops Respiratory: Poor inspiratory effort, diminished breath sounds at bases  Abdominal: Soft, non tender, non distended, bowel sounds +, no guarding Extremities: no edema, no cyanosis, pulses palpable bilaterally DP and PT  Discharge Instructions  Discharge Orders   Future Orders  Complete By Expires   Diet - low sodium heart healthy  As directed    Increase activity slowly  As directed        Medication List    STOP taking these medications       allopurinol 100 MG tablet  Commonly known as:  ZYLOPRIM     beta carotene w/minerals tablet     carbamazepine 200 MG 12 hr tablet  Commonly known as:  TEGRETOL XR     CITRUCEL PO     darifenacin 15 MG 24 hr tablet  Commonly known as:  ENABLEX     diltiazem 180 MG 24 hr capsule  Commonly known as:  TIAZAC     finasteride 5 MG tablet  Commonly known as:  PROSCAR     Fish Oil 1200 MG Caps     glimepiride 2 MG tablet  Commonly known as:  AMARYL     guaiFENesin 600 MG 12 hr tablet  Commonly known as:  MUCINEX     insulin aspart 100 UNIT/ML injection  Commonly known as:  novoLOG     insulin detemir 100 UNIT/ML injection  Commonly known as:  LEVEMIR     lactose free nutrition Liqd     nitrofurantoin (macrocrystal-monohydrate) 100 MG capsule  Commonly known as:  MACROBID     omeprazole 20 MG capsule  Commonly known as:  PRILOSEC     rosuvastatin 10 MG tablet  Commonly known as:  CRESTOR      TAKE these medications       acetaminophen 500 MG tablet  Commonly known as:  TYLENOL  Take 1,000 mg by mouth 2 (two) times daily.     albuterol (2.5 MG/3ML) 0.083% nebulizer solution  Commonly known as:  PROVENTIL  Take 3 mLs (2.5 mg total) by nebulization every 2 (two) hours as needed for wheezing.     LORazepam 2 MG/ML injection  Commonly known as:  ATIVAN  Inject 0.5 mLs (1 mg total) into the vein every 4 (four) hours as needed.     morphine 20 MG/ML concentrated solution  Commonly known as:  ROXANOL  Take 1 mL (20 mg total) by mouth every 4 (four) hours as needed for moderate pain, severe pain, anxiety or shortness of breath.     ondansetron 4 MG tablet  Commonly known as:  ZOFRAN  Take 4 mg by mouth every 6 (six) hours as needed for nausea or vomiting.           Follow-up Information    Follow up with Laurena Slimmer, MD. (As needed)    Specialty:  Internal Medicine   Contact information:   [address] [place] [address] [phone]        The results of significant diagnostics from this hospitalization (including imaging, microbiology, ancillary and laboratory) are listed below for reference.     Microbiology: Recent Results (from the past 240 hour(s))  URINE CULTURE     Status: None   Collection Time    04/22/13 12:19 PM      Result Value Ref Range Status   Specimen Description URINE, CATHETERIZED   Final   Special Requests NONE   Final   Culture  Setup Time     Final   Value: 04/22/2013 19:04     Performed at [facility]   [place] Count     Final   Value: NO GROWTH     Performed at [facility]   Culture     Final   Value: NO GROWTH     Performed at [facility]   Report Status 04/23/2013 FINAL   Final     Labs: Basic Metabolic Panel:  Recent Labs Lab 04/23/13 0500 04/23/13 1610 04/24/13 0530 04/24/13 1550 04/25/13 0455  NA 165* 165* 160* 157* 154*  K 4.8 4.2 4.7 4.1 4.2  CL 130* 131* 127* 125* 120*  CO2 24 23 25 23 22   GLUCOSE 207* 143* 234* 230* 259*  BUN 81* 66* 59* 51* 42*  CREATININE 3.45* 2.93* 2.52* 2.05* 1.76*  CALCIUM 8.0* 8.3* 8.1* 8.3* 8.1*   Liver Function Tests:  Recent Labs Lab 04/23/13 0500  AST 49*  ALT 17  ALKPHOS 69  BILITOT 0.3  PROT 6.9  ALBUMIN 2.7*   CBC:  Recent Labs Lab 04/22/13 0956 04/23/13 0500 04/24/13 0530  WBC 13.3* 11.9* 10.5  NEUTROABS 8.9*  --   --   HGB 12.7* 10.7* 10.2*  HCT 42.3 35.5* 33.3*  MCV 100.0 100.6* 98.5  PLT 140* 115* 104*   CBG:  Recent Labs Lab 04/24/13 1235 04/24/13 1655 04/24/13 2151 04/25/13 0719 04/25/13 1150  GLUCAP 192* 197* 165* 235* 230*   SIGNED: Time coordinating discharge: Over 30 minutes  Debbora Presto, MD  [facility] 04/26/2013, 10:34 AM Pager [phone]  If 7PM-7AM, please  contact night-coverage [email or web address] Password [email or web address]

## 2013-04-26 NOTE — Discharge Instructions (Signed)
Hospice Care  °Hospice is a care service which can be used by people who are terminally ill and in whom healing is no longer thought possible. Hospice care is for people believed to have no more than 6 months to live. It is meant to help with the two largest fears near the end of life (the fears of dying and of being alone), as well as pain management, and an attempt to allow people to pass away comfortably at home.  °Hospice staff: °· Administer appropriate pain relief. °· Provide nursing care. °· Offer reassurance and support to loved ones and family members. °· Provide services to keep people comfortable at home or in a hospice facility. °Together, you can see to it that your loved one is not alone during this last and important phase of life. °You, your family, and your caregivers help you decide when hospice services should begin. If your condition improves or the disease goes into remission, you can be discharged from the hospice program. You can return to hospice care at a later time if needed. °The hospice philosophy recognizes death as the final stage of life. It helps patients continue an alert, pain-free life, and manage symptoms while surrounded by their loved ones. Hospice affirms life without hurrying death. Hospice care treats the person rather than the disease. It emphasizes quality of life with family-centered care. Hospice care involves the patient and family and helps them make decisions.  °The care is designed to: °· Relieve or decrease pain. °· Control other problems. °· Provide as much quality time as possible. °· Allow people to die with dignity. °Unlike other medical care, the focus is no longer on curing disease. The goal of hospice care is to offer as high a quality of life as possible during the end of life. In this way, the last days of life may be spent with dignity.  °With hospice care, instead of spending the last weeks or months in a hospital, a person is with loved ones in the home  or a homelike setting. About 90 percent of hospice care is provided at home. But hospice is available wherever a person lives, including a nursing home or assisted-living residence. Some residential hospices designed specifically for hospice care also exist. Hospice care is available for many types of terminal illnesses. °Hospice services are meant to serve both the patient and family members. °· Comfort. In most cases, the individual stays in his or her home or in homelike surroundings instead of in a hospital. The core of hospice is a cooperative effort by family, friends and a team of professional and volunteer caregivers working together to meet your loved one's needs. This team supplies all necessary medicines and equipment. It works with both the person involved and family members to relieve pain and symptoms. °· Support. Individuals enjoy the support of loved ones by receiving much of the basic care from family and friends. A nurse may lead the team and coordinates the day-to-day care. A doctor is also part of the team. Chaplains and social workers are available to counsel the family and their loved one. They make sure emotional, spiritual, and social needs are being met. Trained volunteers perform a wide variety of tasks as needed, such as: °· Providing companionship. °· Doing light housekeeping. °· Preparing meals. °· Running errands. °· Providing respite for the family. °· Improving quality of life. Caring for someone who is dying is emotionally and physically demanding. This can be particularly true for family members   who are primary caregivers. But you can take comfort in knowing that hospice is an act of love that can improve the quality of life for all involved. Professionals are often available to tend to the needs of grieving family members as well.  Spiritual Care. Hospice care emphasizes the spiritual needs of you and your family. People differ in their spiritual needs and religious beliefs so  spiritual care is individualized to meet the persons' and family's needs. It may include helping you to look at what death means to you, to say good-bye, or to perform a specific religious ceremony or ritual. HOW TO SELECT A PROGRAM Most hospice programs are run by nonprofit, independent organizations. Some are affiliated with hospitals, nursing homes or home health care agencies. Some are for-profit organizations. You can learn about existing hospice programs in your area from your health caregivers. ASK THE FOLLOWING:  What services are available to the patient?  What services are offered to the family?  Are bereavement services available?  How involved are the family members?  How involved is the doctor?  Who makes up the hospice care team? How are they trained or screened?  How will the individual's pain and symptoms be managed?  If circumstances change, can services be provided in different settings, such as the home or the hospital?  Is the program reviewed and licensed by the state or certified in some other way?  Are all costs covered by insurance? How much you pay for hospice care can vary greatly. It depends on the length and type of care necessary and your insurance coverage. Medicare and most private insurance plans, including managed care organizations, cover hospice care. Hospice is also covered by Medicaid in most states. Some hospice programs provide services on a sliding fee scale, based on your ability to pay. They may also provide some durable medical equipment for support within the home. Document Released: 06/11/2003 Document Revised: 05/17/2011 Document Reviewed: 02/22/2005 [facility] Patient Information 2014 [place], [place].

## 2013-04-26 NOTE — Progress Notes (Signed)
[facility].  Philomena Dohenyavid Mairen Wallenstein RN

## 2013-05-06 DEATH — deceased

## 2013-09-27 ENCOUNTER — Telehealth: Payer: Self-pay | Admitting: Cardiology

## 2013-09-27 NOTE — Telephone Encounter (Signed)
Mr Maisie Fushomas passed away back in 04/2013.Marland Kitchen. His wife called .Marland Kitchen. She stated that he expired back in February .Marland Kitchen. No  Specific Date..  Thanks

## 2013-11-27 IMAGING — CR DG CHEST 2V
2 series · 2 of 2 positions shown · non-contrast
Comparison: March 18, 2012.

CLINICAL DATA: Shortness of breath, chest pain.

EXAM:
CHEST  2 VIEW

[w chest lat]
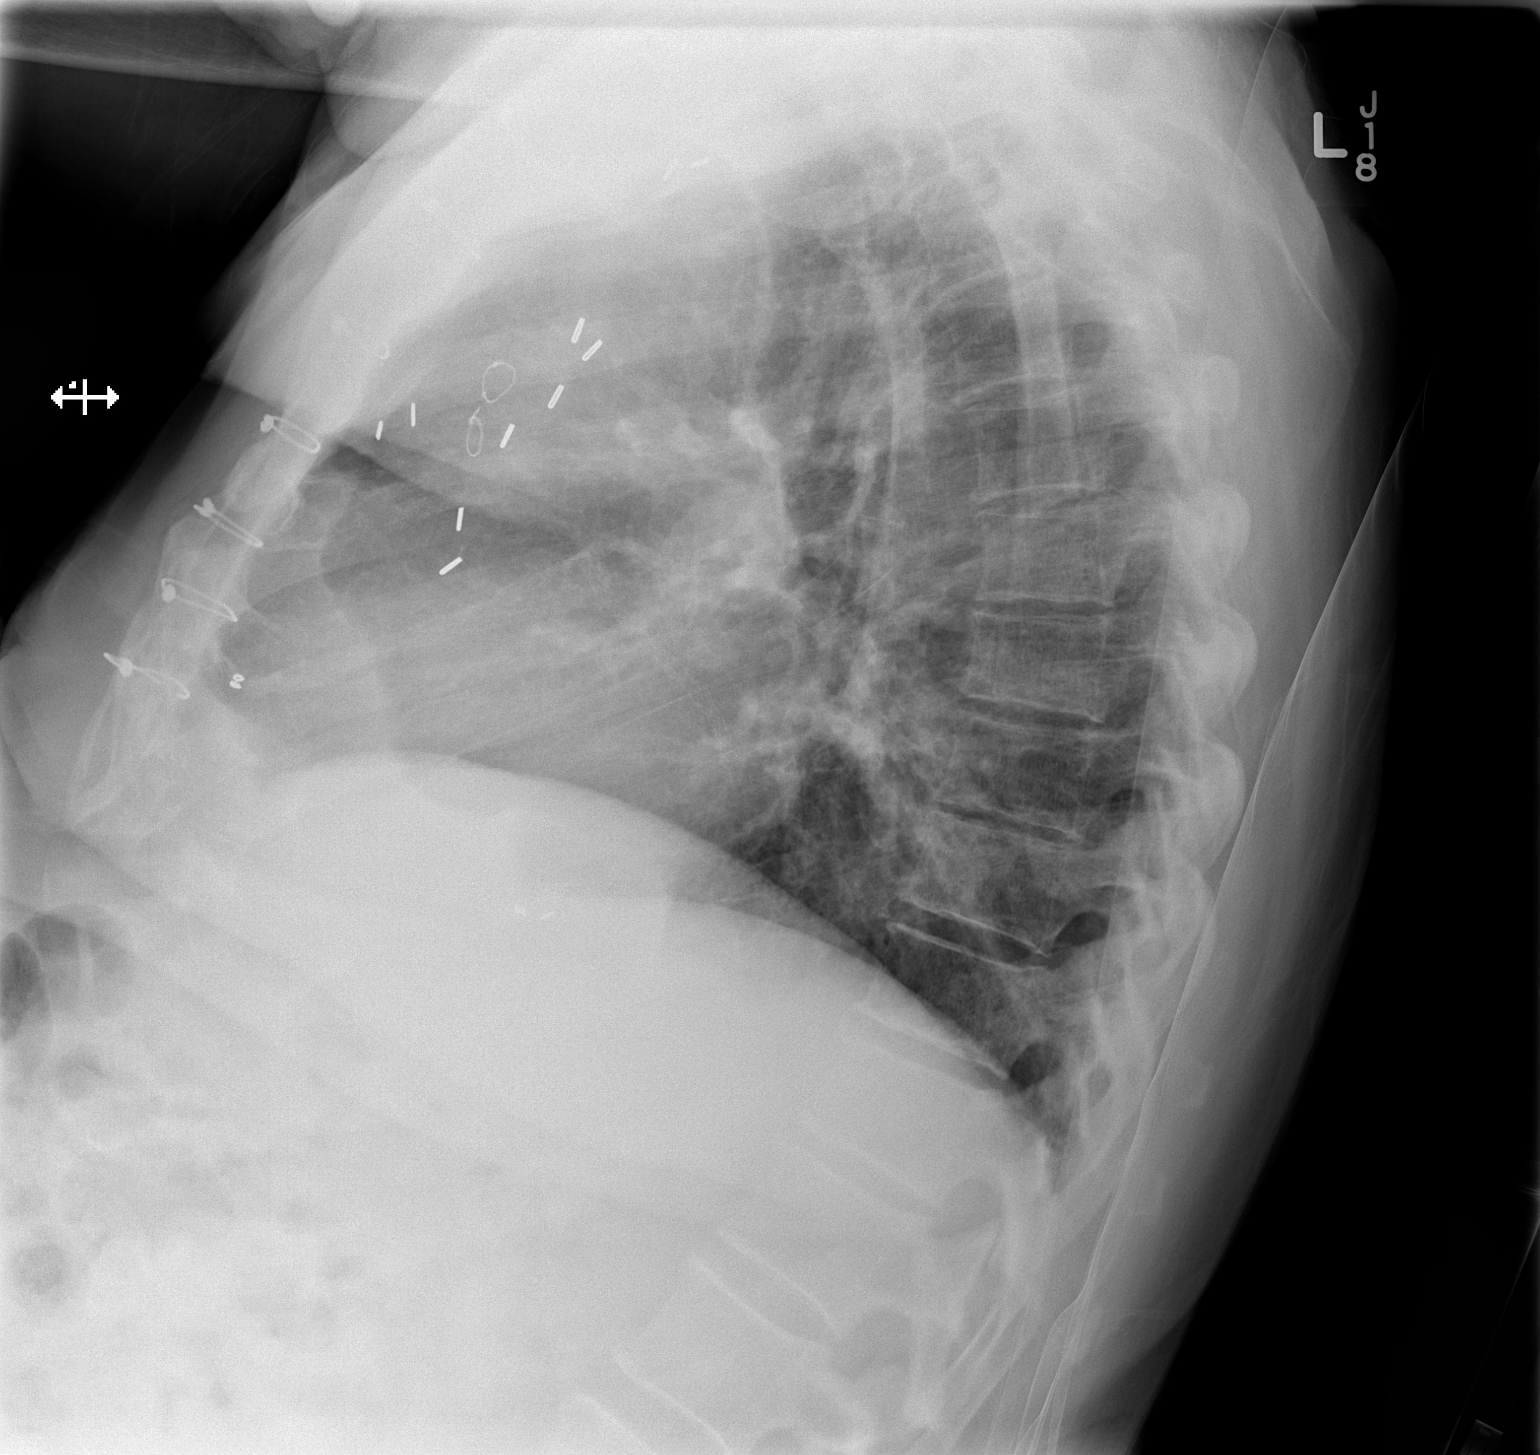

[x chest ap]
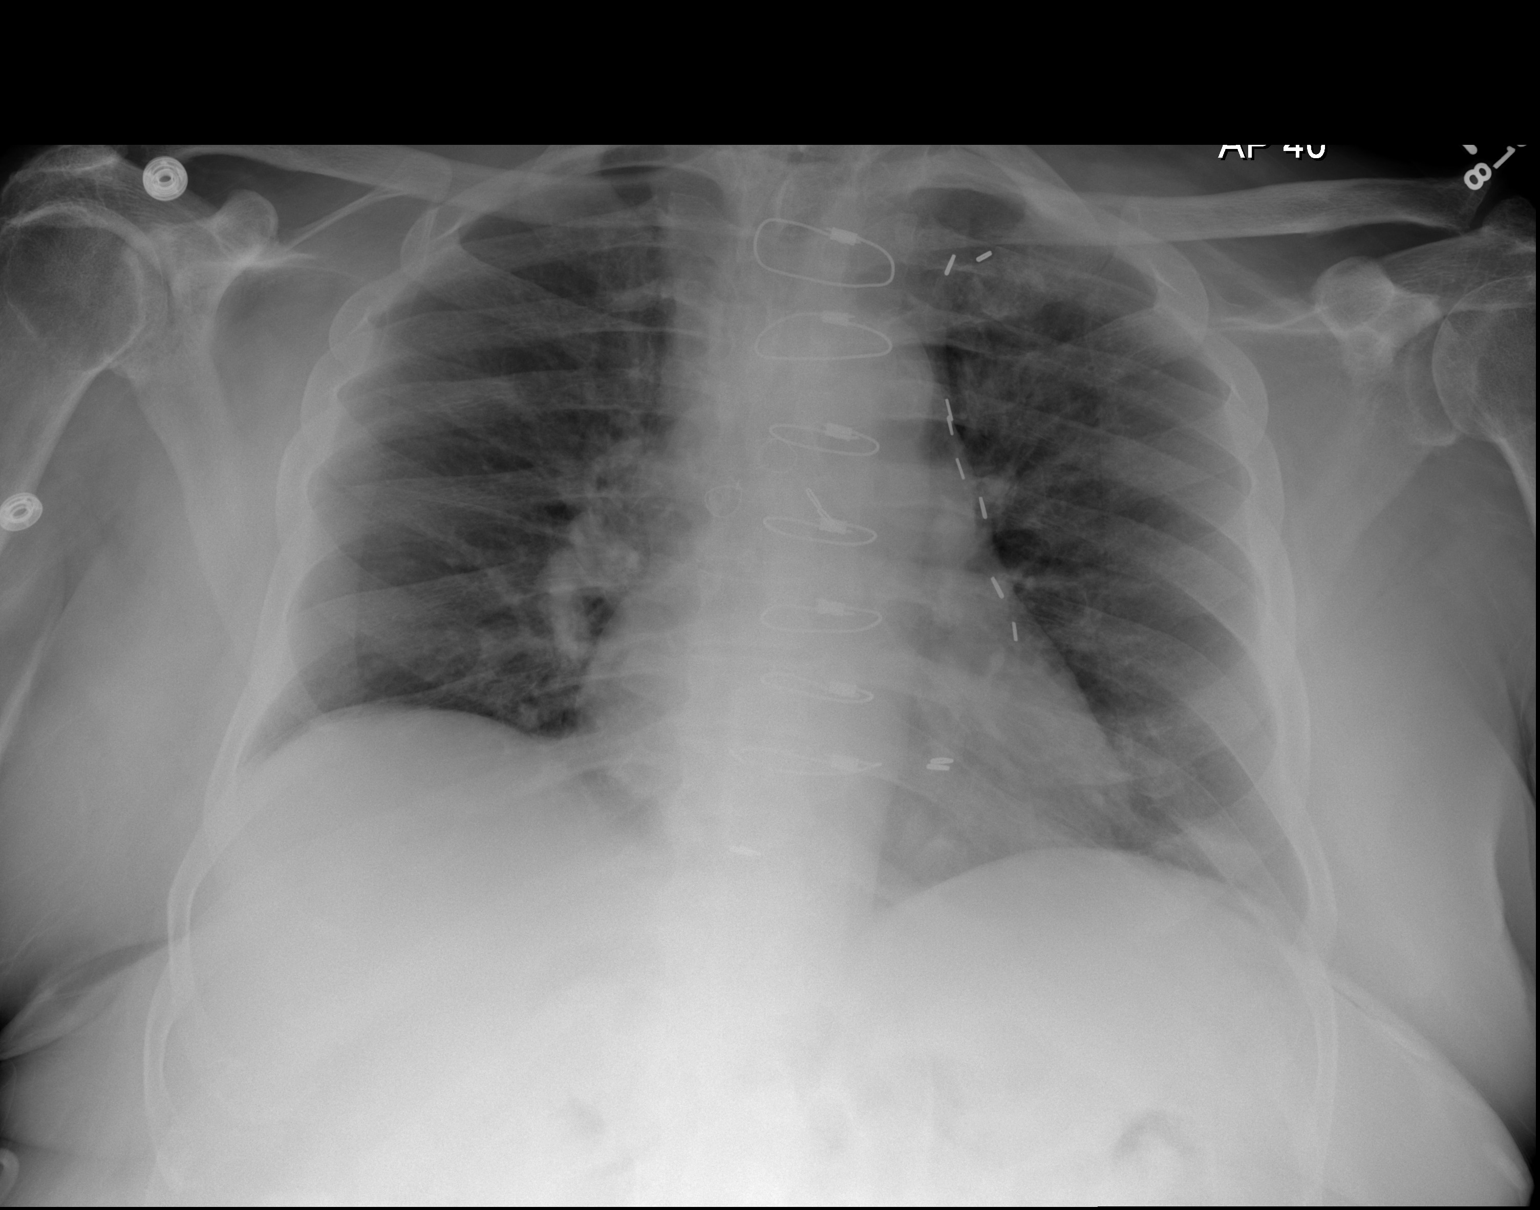

[2 of 2 positions shown; findings below may reference images not displayed]

FINDINGS: Status post coronary artery bypass graft. No pneumothorax or pleural
effusion is noted. Cardiomediastinal silhouette is normal. No acute
pulmonary disease is noted. Bony thorax is intact.
IMPRESSION: No acute cardiopulmonary abnormality seen.
